# Patient Record
Sex: Female | Born: 1940 | Race: White | Hispanic: No | Marital: Married | Smoking: Former smoker
Health system: Southern US, Community
[De-identification: ages and names within clinical notes are randomized; demographics above are authoritative.]

## PROBLEM LIST (undated history)

## (undated) DIAGNOSIS — I4891 Unspecified atrial fibrillation: Secondary | ICD-10-CM

## (undated) DIAGNOSIS — G8929 Other chronic pain: Secondary | ICD-10-CM

## (undated) DIAGNOSIS — I10 Essential (primary) hypertension: Secondary | ICD-10-CM

## (undated) DIAGNOSIS — R7301 Impaired fasting glucose: Secondary | ICD-10-CM

## (undated) DIAGNOSIS — H9190 Unspecified hearing loss, unspecified ear: Secondary | ICD-10-CM

## (undated) DIAGNOSIS — E039 Hypothyroidism, unspecified: Secondary | ICD-10-CM

## (undated) DIAGNOSIS — E785 Hyperlipidemia, unspecified: Secondary | ICD-10-CM

## (undated) DIAGNOSIS — I2510 Atherosclerotic heart disease of native coronary artery without angina pectoris: Secondary | ICD-10-CM

## (undated) DIAGNOSIS — M549 Dorsalgia, unspecified: Secondary | ICD-10-CM

## (undated) DIAGNOSIS — G4730 Sleep apnea, unspecified: Secondary | ICD-10-CM

## (undated) DIAGNOSIS — Z9289 Personal history of other medical treatment: Secondary | ICD-10-CM

## (undated) DIAGNOSIS — S8290XA Unspecified fracture of unspecified lower leg, initial encounter for closed fracture: Secondary | ICD-10-CM

## (undated) HISTORY — DX: Hyperlipidemia, unspecified: E78.5

## (undated) HISTORY — DX: Unspecified hearing loss, unspecified ear: H91.90

## (undated) HISTORY — DX: Dorsalgia, unspecified: M54.9

## (undated) HISTORY — DX: Impaired fasting glucose: R73.01

## (undated) HISTORY — PX: SPINE SURGERY: SHX786

## (undated) HISTORY — DX: Unspecified fracture of unspecified lower leg, initial encounter for closed fracture: S82.90XA

## (undated) HISTORY — DX: Sleep apnea, unspecified: G47.30

## (undated) HISTORY — DX: Personal history of other medical treatment: Z92.89

## (undated) HISTORY — DX: Hypothyroidism, unspecified: E03.9

## (undated) HISTORY — DX: Essential (primary) hypertension: I10

## (undated) HISTORY — DX: Other chronic pain: G89.29

## (undated) HISTORY — DX: Atherosclerotic heart disease of native coronary artery without angina pectoris: I25.10

## (undated) HISTORY — DX: Unspecified atrial fibrillation: I48.91

---
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
## 1973-12-19 HISTORY — PX: ABDOMINAL HYSTERECTOMY: SHX81

## 2003-02-03 ENCOUNTER — Encounter: Payer: Self-pay | Admitting: Orthopedic Surgery

## 2003-02-03 ENCOUNTER — Ambulatory Visit (HOSPITAL_COMMUNITY): Admission: RE | Admit: 2003-02-03 | Discharge: 2003-02-03 | Payer: Self-pay | Admitting: Orthopedic Surgery

## 2003-02-06 ENCOUNTER — Ambulatory Visit (HOSPITAL_COMMUNITY): Admission: RE | Admit: 2003-02-06 | Discharge: 2003-02-06 | Payer: Self-pay | Admitting: Internal Medicine

## 2003-02-06 ENCOUNTER — Encounter: Payer: Self-pay | Admitting: Internal Medicine

## 2003-02-12 ENCOUNTER — Inpatient Hospital Stay (HOSPITAL_COMMUNITY): Admission: AD | Admit: 2003-02-12 | Discharge: 2003-02-13 | Payer: Self-pay | Admitting: Orthopedic Surgery

## 2003-02-12 ENCOUNTER — Encounter (INDEPENDENT_AMBULATORY_CARE_PROVIDER_SITE_OTHER): Payer: Self-pay | Admitting: *Deleted

## 2003-02-12 ENCOUNTER — Encounter: Payer: Self-pay | Admitting: Orthopedic Surgery

## 2006-01-04 ENCOUNTER — Other Ambulatory Visit: Admission: RE | Admit: 2006-01-04 | Discharge: 2006-01-04 | Payer: Self-pay | Admitting: Family Medicine

## 2006-03-21 HISTORY — PX: COLONOSCOPY: SHX174

## 2006-03-21 LAB — HM COLONOSCOPY

## 2006-08-16 ENCOUNTER — Ambulatory Visit: Payer: Self-pay | Admitting: Family Medicine

## 2006-10-24 ENCOUNTER — Ambulatory Visit: Payer: Self-pay | Admitting: Family Medicine

## 2006-12-19 HISTORY — PX: CORONARY ANGIOPLASTY WITH STENT PLACEMENT: SHX49

## 2007-02-22 ENCOUNTER — Ambulatory Visit: Payer: Self-pay | Admitting: Family Medicine

## 2007-08-15 ENCOUNTER — Ambulatory Visit: Payer: Self-pay | Admitting: Family Medicine

## 2007-09-11 DIAGNOSIS — Z9289 Personal history of other medical treatment: Secondary | ICD-10-CM

## 2007-09-11 HISTORY — DX: Personal history of other medical treatment: Z92.89

## 2007-09-13 ENCOUNTER — Ambulatory Visit (HOSPITAL_COMMUNITY): Admission: RE | Admit: 2007-09-13 | Discharge: 2007-09-14 | Payer: Self-pay | Admitting: *Deleted

## 2008-01-01 ENCOUNTER — Ambulatory Visit: Payer: Self-pay | Admitting: Family Medicine

## 2008-01-08 ENCOUNTER — Ambulatory Visit: Payer: Self-pay | Admitting: Family Medicine

## 2008-01-15 LAB — HM DEXA SCAN

## 2008-10-14 ENCOUNTER — Ambulatory Visit: Payer: Self-pay | Admitting: Family Medicine

## 2008-10-16 ENCOUNTER — Encounter: Admission: RE | Admit: 2008-10-16 | Discharge: 2008-10-16 | Payer: Self-pay | Admitting: Family Medicine

## 2008-10-29 ENCOUNTER — Ambulatory Visit: Payer: Self-pay | Admitting: Family Medicine

## 2008-11-12 ENCOUNTER — Ambulatory Visit: Payer: Self-pay | Admitting: Family Medicine

## 2009-04-22 ENCOUNTER — Ambulatory Visit: Payer: Self-pay | Admitting: Family Medicine

## 2009-05-20 ENCOUNTER — Ambulatory Visit: Payer: Self-pay | Admitting: Family Medicine

## 2009-06-11 ENCOUNTER — Ambulatory Visit: Payer: Self-pay | Admitting: Family Medicine

## 2009-12-19 HISTORY — PX: KIDNEY SURGERY: SHX687

## 2010-01-21 ENCOUNTER — Ambulatory Visit: Payer: Self-pay | Admitting: Family Medicine

## 2010-01-21 ENCOUNTER — Other Ambulatory Visit: Admission: RE | Admit: 2010-01-21 | Discharge: 2010-01-21 | Payer: Self-pay | Admitting: Family Medicine

## 2010-01-21 LAB — HM PAP SMEAR: HM Pap smear: NORMAL

## 2010-02-03 ENCOUNTER — Ambulatory Visit: Payer: Self-pay | Admitting: Family Medicine

## 2010-03-04 ENCOUNTER — Encounter: Admission: RE | Admit: 2010-03-04 | Discharge: 2010-03-04 | Payer: Self-pay | Admitting: Family Medicine

## 2010-03-04 LAB — HM MAMMOGRAPHY: HM Mammogram: NEGATIVE

## 2010-04-26 ENCOUNTER — Encounter: Admission: RE | Admit: 2010-04-26 | Discharge: 2010-04-26 | Payer: Self-pay | Admitting: Nephrology

## 2010-05-20 ENCOUNTER — Ambulatory Visit (HOSPITAL_COMMUNITY): Admission: RE | Admit: 2010-05-20 | Discharge: 2010-05-20 | Payer: Self-pay | Admitting: Urology

## 2010-05-26 ENCOUNTER — Encounter: Admission: RE | Admit: 2010-05-26 | Discharge: 2010-05-26 | Payer: Self-pay | Admitting: Urology

## 2010-07-30 ENCOUNTER — Ambulatory Visit (HOSPITAL_COMMUNITY): Admission: RE | Admit: 2010-07-30 | Discharge: 2010-07-31 | Payer: Self-pay | Admitting: Interventional Radiology

## 2010-07-30 ENCOUNTER — Encounter (INDEPENDENT_AMBULATORY_CARE_PROVIDER_SITE_OTHER): Payer: Self-pay | Admitting: Interventional Radiology

## 2010-09-08 ENCOUNTER — Encounter: Admission: RE | Admit: 2010-09-08 | Discharge: 2010-09-08 | Payer: Self-pay | Admitting: Interventional Radiology

## 2010-09-08 ENCOUNTER — Ambulatory Visit (HOSPITAL_COMMUNITY): Admission: RE | Admit: 2010-09-08 | Discharge: 2010-09-08 | Payer: Self-pay | Admitting: Interventional Radiology

## 2010-09-16 ENCOUNTER — Ambulatory Visit: Payer: Self-pay | Admitting: Family Medicine

## 2010-09-22 ENCOUNTER — Ambulatory Visit: Payer: Self-pay | Admitting: Family Medicine

## 2010-09-30 ENCOUNTER — Ambulatory Visit: Payer: Self-pay | Admitting: Family Medicine

## 2010-10-27 ENCOUNTER — Ambulatory Visit: Payer: Self-pay | Admitting: Family Medicine

## 2010-11-05 ENCOUNTER — Ambulatory Visit: Payer: Self-pay | Admitting: Family Medicine

## 2010-11-17 DIAGNOSIS — Z9289 Personal history of other medical treatment: Secondary | ICD-10-CM

## 2010-11-17 HISTORY — DX: Personal history of other medical treatment: Z92.89

## 2010-12-01 ENCOUNTER — Ambulatory Visit (HOSPITAL_COMMUNITY)
Admission: RE | Admit: 2010-12-01 | Discharge: 2010-12-01 | Payer: Self-pay | Source: Home / Self Care | Attending: Urology | Admitting: Urology

## 2011-01-09 ENCOUNTER — Encounter: Payer: Self-pay | Admitting: Interventional Radiology

## 2011-03-04 LAB — BASIC METABOLIC PANEL
BUN: 20 mg/dL (ref 6–23)
CO2: 27 mEq/L (ref 19–32)
CO2: 28 mEq/L (ref 19–32)
Calcium: 8.6 mg/dL (ref 8.4–10.5)
Chloride: 102 mEq/L (ref 96–112)
GFR calc Af Amer: >60 mL/min (ref >60)
GFR calc non Af Amer: >60 mL/min (ref >60)
Glucose, Bld: 115 mg/dL — ABNORMAL HIGH (ref 70–99)
Glucose, Bld: 95 mg/dL (ref 70–99)

## 2011-03-04 LAB — TYPE AND SCREEN
ABO/RH(D): A POS
Antibody Screen: POSITIVE
Donor AG Type: NEGATIVE
Donor AG Type: NEGATIVE
PT AG Type: NEGATIVE

## 2011-03-04 LAB — CBC
HCT: 32.9 % — ABNORMAL LOW (ref 36.0–46.0)
HCT: 35.4 % — ABNORMAL LOW (ref 36.0–46.0)
MCH: 34.2 pg — ABNORMAL HIGH (ref 26.0–34.0)
MCV: 97.5 fL (ref 78.0–100.0)
RBC: 3.37 MIL/uL — ABNORMAL LOW (ref 3.87–5.11)
RBC: 3.66 MIL/uL — ABNORMAL LOW (ref 3.87–5.11)
RDW: 13.1 % (ref 11.5–15.5)
WBC: 5.6 10*3/uL (ref 4.0–10.5)

## 2011-03-04 LAB — APTT: aPTT: 32 seconds (ref 24–37)

## 2011-03-04 LAB — SURGICAL PCR SCREEN
MRSA, PCR: NEGATIVE
Staphylococcus aureus: NEGATIVE

## 2011-03-14 ENCOUNTER — Other Ambulatory Visit: Payer: Self-pay | Admitting: Interventional Radiology

## 2011-03-14 DIAGNOSIS — C649 Malignant neoplasm of unspecified kidney, except renal pelvis: Secondary | ICD-10-CM

## 2011-03-15 ENCOUNTER — Other Ambulatory Visit (HOSPITAL_COMMUNITY): Payer: Self-pay | Admitting: Interventional Radiology

## 2011-03-15 DIAGNOSIS — C649 Malignant neoplasm of unspecified kidney, except renal pelvis: Secondary | ICD-10-CM

## 2011-03-23 ENCOUNTER — Ambulatory Visit
Admission: RE | Admit: 2011-03-23 | Discharge: 2011-03-23 | Disposition: A | Discharge status: Home / Self Care | Payer: Medicare Other | Source: Ambulatory Visit | Attending: Interventional Radiology | Admitting: Interventional Radiology

## 2011-03-23 ENCOUNTER — Ambulatory Visit (HOSPITAL_COMMUNITY)
Admission: RE | Admit: 2011-03-23 | Discharge: 2011-03-23 | Disposition: A | Discharge status: Home / Self Care | Payer: Medicare Other | Source: Ambulatory Visit | Attending: Interventional Radiology | Admitting: Interventional Radiology

## 2011-03-23 VITALS — BP 147/77 | HR 66 | Temp 98.1°F | Resp 16 | Ht 63.0 in | Wt 165.0 lb

## 2011-03-23 DIAGNOSIS — C649 Malignant neoplasm of unspecified kidney, except renal pelvis: Secondary | ICD-10-CM

## 2011-03-23 DIAGNOSIS — K7689 Other specified diseases of liver: Secondary | ICD-10-CM | POA: Insufficient documentation

## 2011-03-23 DIAGNOSIS — Z85528 Personal history of other malignant neoplasm of kidney: Secondary | ICD-10-CM | POA: Insufficient documentation

## 2011-03-23 DIAGNOSIS — K869 Disease of pancreas, unspecified: Secondary | ICD-10-CM | POA: Insufficient documentation

## 2011-03-23 DIAGNOSIS — N289 Disorder of kidney and ureter, unspecified: Secondary | ICD-10-CM | POA: Insufficient documentation

## 2011-03-23 MED ORDER — IOHEXOL 300 MG/ML  SOLN
100.0000 mL | Freq: Once | INTRAMUSCULAR | Status: AC | PRN
  Administered 2011-03-23: 100 mL via INTRAVENOUS

## 2011-03-24 NOTE — Progress Notes (Signed)
Pt states that she has experience 3 episodes of hematuria post RFA Right which respond immediately to antibiotic Rx.  Two episodes occurred after exercise.  Currently taking antibiotic  Prescribed by Dr Susann Givens for UTI Sx.  Otherwise has been doing well.  Working full time as Building services engineer.

## 2011-05-03 NOTE — Discharge Summary (Signed)
[name], Theresa                ACCOUNT NO.:  [id number]   MEDICAL RECORD NO.:  [id number]          PATIENT TYPE:  OIB   LOCATION:  6522                         FACILITY:  [facility]   PHYSICIAN:  Ritta Slot, MD     DATE OF BIRTH:  Oct 04, 1941   DATE OF ADMISSION:  09/13/2007  DATE OF DISCHARGE:  09/14/2007                               DISCHARGE SUMMARY   DISCHARGE DIAGNOSES:  1. Chest pain, outpatient catheterization revealing left anterior      descending  disease status post elective left anterior descending      Endeavor stent placement this admission.  2. Preserved left ventricular function.  3. Treated hypertension.   HOSPITAL COURSE:  The patient is a pleasant 70 year old female with  hypertension and treated dyslipidemia who presented to Dr. Lynnea Ferrier  August 22, 2007 with complaints of chest pain.  Outpatient evaluation  including a CT scan of her chest, echocardiogram and Myoview were  performed.  Myoview was abnormal.  [facility], 2007.  This revealed an 80% LAD after the takeoff of the first diagonal with  normal circumflex, normal RCA and normal LV function.  The patient was  admitted September 13, 2007 for elective LAD stenting which was  performed by Dr. Jenne Campus using an Endeavor stent.  She tolerated the  procedure well, and we feel she can discharge September 14, 2007.   LABORATORY DATA:  TSH is 3.975.  CK-MB and troponins were negative.  Sodium 137, potassium 3.5, BUN 14, creatinine 0.86, glucose 87.  White  count 4.1, hemoglobin 10.8, hematocrit 31.1, platelets 192.  EKG shows  sinus rhythm and sinus bradycardia without acute changes.   DISCHARGE MEDICATIONS:  1. Lipitor 20 mg a day.  2. Lotrel 5/20 daily.  3. Triamterene hydrochlorothiazide 37.5/12.5 daily.  4. Synthroid 0.05 mg a day.  5. Naprosyn 500 mg b.i.d. p.r.n.  6. B-complex vitamins.  7. Calcium.  8.  Magnesium.  9. Zinc.  10.Vitamin D as taken at home.  11.Flax seed oil once a day.  12.Plavix 75 mg a day.  13.Two baby aspirin daily.  14.Nitroglycerin sublingual p.r.n.   DISPOSITION:  The patient is discharged in stable condition and will  follow up with Dr. Lynnea Ferrier as an outpatient.      Abelino Derrick, P.A.      Ritta Slot, MD  Electronically Signed    LKK/MEDQ  D:  09/14/2007  T:  09/14/2007  Job:  [id number]   cc:   Ritta Slot, MD  Sharlot Gowda, M.D.

## 2011-05-03 NOTE — Cardiovascular Report (Signed)
Theresa Mccarty, Theresa Mccarty                ACCOUNT NO.:  [id number]   MEDICAL RECORD NO.:  [id number]          PATIENT TYPE:  OIB   LOCATION:  2859                         FACILITY:  [facility]   PHYSICIAN:  Darlin Priestly, MD  DATE OF BIRTH:  04/23/1941   DATE OF PROCEDURE:  09/13/2007  DATE OF DISCHARGE:                            CARDIAC CATHETERIZATION   PROCEDURE:  1. Coronary angiography.  2. LAD-mid:      a.     Placement of intracoronary stent.   ATTENDING:  Darlin Priestly, MD   COMPLICATIONS:  None.   INDICATIONS:  [name] is a 70 year old female patient of Dr. Sharlot Gowda, Dr. Ritta Slot, with a history of hypertension,  hyperlipidemia, history of atypical chest pain on and off for the last  month.  It appears she did have Cardiolite scan suggesting anterior  ischemia with subsequent cardiac catheterization by Dr. Lynnea Ferrier,  demonstrating an 80 to 90% mid-LAD stenotic lesion.  She is now brought  for percutaneous intervention of the LAD.   DESCRIPTION OF PROCEDURE:  After giving informed consent, the patient is  brought to the cardiac cath lab where the right groin is shaved, prepped  and draped in a sterile fashion. ECG monitor established.  Using a  modified Seldinger technique, #6 French arterial sheath inserted in the  right femoral artery.  Next a #6 [place] guiding catheter was  coaxially engaged in the left coronary ostium.  Following this, a 0.014  Cougar XT guide wire was advanced down the guiding catheter and  positioned in the distal LAD without difficulty.  Next a Medtronic  Endeavor 2.5 x 14 mm stent was then positioned across the mid-LAD  stenotic lesion.  The stent was then deployed to a maximum of 9  atmospheres for a total of 14 seconds.  Her second inflation at 10  atmospheres performed for a total of 13 seconds.  Followup angiogram  revealed no evidence of dissection or thrombus with TIMI flow to the  distal vessel.  The stent balloon is then  removed and a 2.5 x 10 mm Dura  Star balloon was then positioned in the distal portion of the stent.  Multiple overlapping inflations throughout the distal mid-and-proximal  portion of the stent up to 20 atmospheres performed for a total of  approximately 46 seconds.  Follow-up angiogram revealed no evidence of  dissection or thrombus with TIMI flow to distal vessel.  IV Integrilin  was used throughout the case.  Intravenous dose of heparin given to  maintain the ST between 200 and 300.   Final orthogonal angiograms revealed less than 20 cm of  stenosis in the  mid-LAD stenotic lesion with TIMI flow to the distal vessel.  At this  point we elected to conclude the procedure.  All balloons, wires and  catheters were removed.  Hemostatic sheaths were sewn in place.  The  patient transferred back to the ward in stable condition.   CONCLUSION:  1. Successful placement of a Medtronic Endeavor 2.5 x 14 mm stent in      the mid-LAD stenotic  lesion, ultimately post dilated to 2.58 mm.  2. Adjuvant use of Integrilin infusion.      Darlin Priestly, MD  Electronically Signed     RHM/MEDQ  D:  09/13/2007  T:  09/13/2007  Job:  [id number]   cc:   Sharlot Gowda, M.D.  Ritta Slot, MD

## 2011-05-08 ENCOUNTER — Other Ambulatory Visit: Payer: Self-pay | Admitting: Family Medicine

## 2011-05-23 ENCOUNTER — Other Ambulatory Visit: Payer: Self-pay

## 2011-05-23 MED ORDER — ATORVASTATIN CALCIUM 20 MG PO TABS: 20.0000 mg | ORAL_TABLET | Freq: Every day | ORAL | Status: DC

## 2011-05-31 ENCOUNTER — Ambulatory Visit (HOSPITAL_COMMUNITY)
Admission: RE | Admit: 2011-05-31 | Discharge: 2011-05-31 | Disposition: A | Discharge status: Home / Self Care | Payer: Medicare Other | Source: Ambulatory Visit | Attending: Urology | Admitting: Urology

## 2011-05-31 ENCOUNTER — Other Ambulatory Visit (HOSPITAL_COMMUNITY): Payer: Self-pay | Admitting: Urology

## 2011-05-31 DIAGNOSIS — R062 Wheezing: Secondary | ICD-10-CM | POA: Insufficient documentation

## 2011-05-31 DIAGNOSIS — C649 Malignant neoplasm of unspecified kidney, except renal pelvis: Secondary | ICD-10-CM | POA: Insufficient documentation

## 2011-06-02 ENCOUNTER — Telehealth: Payer: Self-pay | Admitting: Family Medicine

## 2011-06-02 NOTE — Telephone Encounter (Signed)
Called med per jcl 

## 2011-06-02 NOTE — Telephone Encounter (Signed)
Med called in levothyroxine 50 mcg #30 w/0 rf pt called and informed

## 2011-06-02 NOTE — Telephone Encounter (Signed)
Go ahead and call in the thyroid medicine for her

## 2011-06-21 ENCOUNTER — Encounter: Payer: Self-pay | Admitting: Family Medicine

## 2011-07-08 ENCOUNTER — Telehealth: Payer: Self-pay | Admitting: Medical

## 2011-07-08 ENCOUNTER — Other Ambulatory Visit: Payer: Self-pay | Admitting: Medical

## 2011-07-08 MED ORDER — LEVOTHYROXINE SODIUM 50 MCG PO TABS: 50.0000 ug | ORAL_TABLET | Freq: Every day | ORAL | Status: DC

## 2011-07-08 NOTE — Telephone Encounter (Signed)
Rx for thyroid med sent.

## 2011-07-08 NOTE — Telephone Encounter (Signed)
Theresa Mccarty took care of

## 2011-07-11 ENCOUNTER — Encounter: Payer: Self-pay | Admitting: Medical

## 2011-07-11 ENCOUNTER — Ambulatory Visit (INDEPENDENT_AMBULATORY_CARE_PROVIDER_SITE_OTHER): Payer: Medicare Other | Admitting: Medical

## 2011-07-11 VITALS — BP 138/89 | HR 63 | Temp 97.9°F | Ht 63.0 in | Wt 170.5 lb

## 2011-07-11 DIAGNOSIS — T887XXA Unspecified adverse effect of drug or medicament, initial encounter: Secondary | ICD-10-CM

## 2011-07-11 DIAGNOSIS — Z79899 Other long term (current) drug therapy: Secondary | ICD-10-CM

## 2011-07-11 DIAGNOSIS — E785 Hyperlipidemia, unspecified: Secondary | ICD-10-CM

## 2011-07-11 DIAGNOSIS — I2510 Atherosclerotic heart disease of native coronary artery without angina pectoris: Secondary | ICD-10-CM

## 2011-07-11 DIAGNOSIS — E039 Hypothyroidism, unspecified: Secondary | ICD-10-CM

## 2011-07-11 DIAGNOSIS — T50905A Adverse effect of unspecified drugs, medicaments and biological substances, initial encounter: Secondary | ICD-10-CM

## 2011-07-11 LAB — COMPREHENSIVE METABOLIC PANEL
ALT: 18 U/L (ref 0–35)
AST: 20 U/L (ref 0–37)
Alkaline Phosphatase: 73 U/L (ref 39–117)
Creat: 0.76 mg/dL (ref 0.50–1.10)
Total Bilirubin: 0.6 mg/dL (ref 0.3–1.2)

## 2011-07-11 LAB — CBC WITH DIFFERENTIAL/PLATELET
Basophils Absolute: 0 10*3/uL (ref 0.0–0.1)
Eosinophils Relative: 3 % (ref 0–5)
HCT: 35.9 % — ABNORMAL LOW (ref 36.0–46.0)
Lymphocytes Relative: 27 % (ref 12–46)
MCHC: 34.5 g/dL (ref 30.0–36.0)
MCV: 95.5 fL (ref 78.0–100.0)
Monocytes Absolute: 0.3 10*3/uL (ref 0.1–1.0)
RDW: 13.2 % (ref 11.5–15.5)
WBC: 3.7 10*3/uL — ABNORMAL LOW (ref 4.0–10.5)

## 2011-07-11 LAB — LIPID PANEL
Cholesterol: 150 mg/dL (ref 0–200)
LDL Cholesterol: 68 mg/dL (ref 0–99)
VLDL: 14 mg/dL (ref 0–40)

## 2011-07-11 LAB — T4, FREE: Free T4: 1.12 ng/dL (ref 0.80–1.80)

## 2011-07-11 LAB — TSH: TSH: 1.328 u[IU]/mL (ref 0.350–4.500)

## 2011-07-11 MED ORDER — LEVOTHYROXINE SODIUM 50 MCG PO TABS: 50.0000 ug | ORAL_TABLET | Freq: Every day | ORAL | Status: DC

## 2011-07-11 NOTE — Progress Notes (Signed)
Subjective:   HPI  Theresa Mccarty is a 70 y.o. female who presents for general recheck.  Her last visit here was in November 2011. She needs refills on her thyroid medication, and is due for fasting labs. She has a follow up appointment with [facility] with Dr. Rennis Golden later this week. She was seen Dr. Lynnea Ferrier for cardiology but he left the practice. She notes that cardiology had changed her blood pressure medicine and put her on Diovan. However she does not like the way Diovan makes her feel. She notes that it upsets her stomach, and for the first week or so she was on the medication her stool was loose and dark. This has resolved. She does note a colonoscopy within the last 10 years. She would like to get off her cholesterol medicine.  She notes her last few times her cholesterol was checked it was very good.  She is exercising with a stationary bike for about 15 minutes a day. She tries to eat pretty healthy. No other new complaints at this time. She has had regular followup with nephrology, Dr. Laverle Patter, for history of cancer with subsequent surgery around her right kidney.  The following portions of the patient's history were reviewed and updated as appropriate: allergies, current medications, past family history, past medical history, past social history, past surgical history and problem list.  Past Medical History  Diagnosis Date  . Hypertension   . Osteoporosis   . Back pain, chronic   . Hypothyroid   . CAD (coronary artery disease)   . Impaired fasting glucose   . Sleep apnea   . Broken leg 1958  . Renal cell carcinoma     Dr. Laverle Patter, [facility]  . Hyperlipidemia      Review of Systems Constitutional: denies fever, chills, sweats, unexpected weight change, anorexia, fatigue Cardiology: denies chest pain, palpitations, edema Respiratory: denies cough, shortness of breath, wheezing Gastroenterology: denies abdominal pain, nausea, vomiting, diarrhea,  constipation Musculoskeletal: denies arthralgias, myalgias, joint swelling, back pain Urology: denies dysuria, difficulty urinating, hematuria, urinary frequency, urgency Neurology: no headache, weakness, tingling, numbness     Objective:   Physical Exam  General appearance: alert, no distress, WD/WN, white female Skin: warm, dry Neck: supple, no lymphadenopathy, no thyromegaly, no masses, no bruits Heart: RRR, normal S1, S2, no murmurs Lungs: CTA bilaterally, no wheezes, rhonchi, or rales Abdomen: +bs, soft, non tender, non distended, no masses, no hepatomegaly, no splenomegaly, no bruits Extremities: no edema, no cyanosis, no clubbing Pulses: 2+ symmetric, upper and lower extremities, normal cap refill   Assessment :    Encounter Diagnoses  Name Primary?  . Hypothyroidism Yes  . Hyperlipemia   . Encounter for long-term (current) use of other medications   . Coronary artery disease   . Adverse drug effect     Plan:    Hypothyroidism-labs today, and assuming normal labs, refill prescription given today  Hyperlipidemia-fasting labs today, and we will send a copy to her cardiologist.  If LDL still well within goal, we can consider cutting her statin dose in half, but I recommended she stay on this medication given her coronary artery disease  Coronary artery disease-followup with cardiology later this week  Adverse drug effect-she notes problems with GI upset on Diovan, and she will discuss this with them this week  S/p surgery for right renal carcinoma - followed by Dr. [facility]  Overall I recommended she increase the amount of time she is exercising, continue to  try and eat healthy.

## 2011-07-12 ENCOUNTER — Telehealth: Payer: Self-pay | Admitting: *Deleted

## 2011-07-12 NOTE — Telephone Encounter (Addendum)
Message copied by Dorthula Perfect on Tue Jul 12, 2011  8:25 AM ------      Message from: Aleen Campi, Kermit Balo      Created: Tue Jul 12, 2011  7:45 AM       Sodium is a little low and it has been this way before, likely due to her being on fluid pill in her combo BP medication.  Cholesterol looks really good. Thyroid labs normal, electrolytes, liver, kidney, otherwise normal.  There are some minor changes in her blood counts, but nothing to worry about.  As we discussed, she can cut her cholesterol tablet in half.  So just take 1/2 tablet for now.  All other meds the same.  Lets recheck OV and chol labs in 12mo.  Please send copy of note and labs to both [facility]/Dr. Hilty and to Dr. Laverle Patter at [facility].   Pt notified of lab results.  Pt agreed to take 1/2 tablet of cholesterol medication.  Faxed office visit and labs to Dr. Rennis Golden at [facility] and Dr. Laverle Patter at [facility].  Pt will call back to schedule 3 month follow up.  CM, LPN

## 2011-07-15 ENCOUNTER — Ambulatory Visit (INDEPENDENT_AMBULATORY_CARE_PROVIDER_SITE_OTHER): Payer: Medicare Other | Admitting: Family Medicine

## 2011-07-15 ENCOUNTER — Encounter: Payer: Self-pay | Admitting: Family Medicine

## 2011-07-15 ENCOUNTER — Telehealth: Payer: Self-pay | Admitting: Family Medicine

## 2011-07-15 VITALS — BP 140/92 | HR 60 | Wt 174.0 lb

## 2011-07-15 DIAGNOSIS — K922 Gastrointestinal hemorrhage, unspecified: Secondary | ICD-10-CM

## 2011-07-15 NOTE — Telephone Encounter (Signed)
Pt called lots of blood in stool.  She said you discussed getting colonoscopy.  Do you want to see her today or go ahead and refer her to GI?  Please advise pt.

## 2011-07-15 NOTE — Progress Notes (Signed)
  Subjective:    Patient ID: Theresa Mccarty, female    DOB: 09-22-1941, 70 y.o.   MRN: [id number]  HPI This morning she noted an episode of bright red blood in the stool. She's had no abdominal pain, nausea, vomiting. She has had a colonoscopy within the last 10 years but is not sure exactly when. She has noted no tenderness to palpation or lesions in the rectal area.   Review of Systems     Objective:   Physical Exam Alert and in no distress. Anus is normal. Rectal exam shows no internal masses, stool was dark brown and guaiac-negative ;recent blood work was negative.       Assessment & Plan:  GI bleed, etiology unclear Refer to GI for further evaluation.

## 2011-07-21 ENCOUNTER — Other Ambulatory Visit: Payer: Self-pay | Admitting: Interventional Radiology

## 2011-07-22 NOTE — Telephone Encounter (Signed)
done

## 2011-07-26 HISTORY — PX: COLONOSCOPY: SHX174

## 2011-07-26 LAB — HM COLONOSCOPY

## 2011-08-29 ENCOUNTER — Other Ambulatory Visit: Payer: Self-pay | Admitting: Interventional Radiology

## 2011-08-30 LAB — CREATININE WITH EST GFR
Creat: 0.86 mg/dL (ref 0.50–1.10)
GFR, Est Non African American: >60 mL/min (ref >60)

## 2011-08-30 LAB — BUN: BUN: 23 mg/dL (ref 6–23)

## 2011-09-06 ENCOUNTER — Ambulatory Visit
Admission: RE | Admit: 2011-09-06 | Discharge: 2011-09-06 | Disposition: A | Discharge status: Home / Self Care | Payer: Medicare Other | Source: Ambulatory Visit | Attending: Interventional Radiology | Admitting: Interventional Radiology

## 2011-09-06 ENCOUNTER — Ambulatory Visit (HOSPITAL_COMMUNITY)
Admission: RE | Admit: 2011-09-06 | Discharge: 2011-09-06 | Disposition: A | Discharge status: Home / Self Care | Payer: Medicare Other | Source: Ambulatory Visit | Attending: Interventional Radiology | Admitting: Interventional Radiology

## 2011-09-06 DIAGNOSIS — Q619 Cystic kidney disease, unspecified: Secondary | ICD-10-CM | POA: Insufficient documentation

## 2011-09-06 DIAGNOSIS — K7689 Other specified diseases of liver: Secondary | ICD-10-CM | POA: Insufficient documentation

## 2011-09-06 DIAGNOSIS — C649 Malignant neoplasm of unspecified kidney, except renal pelvis: Secondary | ICD-10-CM | POA: Insufficient documentation

## 2011-09-06 MED ORDER — IOHEXOL 300 MG/ML  SOLN: 100.0000 mL | Freq: Once | INTRAMUSCULAR | Status: DC | PRN

## 2011-09-06 NOTE — Progress Notes (Signed)
Denies hematuria.   Denies problems w/ urination.    Denies flank pain or abdominal discomfort.  Continues to work full time at [place].  Endurance good.  Next f/u app't w/ Dr. Laverle Patter scheduled for 11/30/2011

## 2011-09-08 ENCOUNTER — Other Ambulatory Visit (HOSPITAL_COMMUNITY): Payer: Self-pay | Admitting: Interventional Radiology

## 2011-09-08 ENCOUNTER — Other Ambulatory Visit: Payer: Self-pay | Admitting: Interventional Radiology

## 2011-09-29 LAB — CBC
HCT: 31.1 — ABNORMAL LOW
HCT: 32.2 — ABNORMAL LOW
HCT: 33.7 — ABNORMAL LOW
Hemoglobin: 10.8 — ABNORMAL LOW
Hemoglobin: 11.1 — ABNORMAL LOW
Hemoglobin: 11.7 — ABNORMAL LOW
MCHC: 34.5
MCHC: 34.7
MCHC: 34.7
MCV: 93.7
MCV: 94.9
MCV: 95.4
Platelets: 192
Platelets: 224
Platelets: 231
RBC: 3.28 — ABNORMAL LOW
RBC: 3.38 — ABNORMAL LOW
RBC: 3.6 — ABNORMAL LOW
RDW: 12.8
RDW: 12.8
RDW: 12.9
WBC: 3.8 — ABNORMAL LOW
WBC: 4.1
WBC: 5.4

## 2011-09-29 LAB — BASIC METABOLIC PANEL
BUN: 13
BUN: 14
CO2: 27
CO2: 28
Calcium: 8.7
Calcium: 9.2
Chloride: 104
Chloride: 106
Creatinine, Ser: 0.86
Creatinine, Ser: 0.91
GFR calc Af Amer: >60
GFR calc Af Amer: >60
GFR calc non Af Amer: >60
GFR calc non Af Amer: >60
Glucose, Bld: 87
Glucose, Bld: 98
Potassium: 3.5
Potassium: 4.2
Sodium: 137
Sodium: 139

## 2011-09-29 LAB — TROPONIN I
Troponin I: 0.03
Troponin I: 0.04

## 2011-09-29 LAB — CK TOTAL AND CKMB (NOT AT ARMC)
CK, MB: 0.8
CK, MB: 0.9
Relative Index: INVALID
Relative Index: INVALID
Total CK: 16
Total CK: 60

## 2011-09-29 LAB — TSH: TSH: 3.975

## 2011-10-08 ENCOUNTER — Emergency Department (HOSPITAL_COMMUNITY): Payer: Medicare Other

## 2011-10-08 ENCOUNTER — Inpatient Hospital Stay (HOSPITAL_COMMUNITY)
Admission: EM | Admit: 2011-10-08 | Discharge: 2011-10-10 | Disposition: A | Discharge status: Home / Self Care | Payer: Medicare Other | Source: Home / Self Care | Attending: Internal Medicine | Admitting: Internal Medicine

## 2011-10-08 DIAGNOSIS — I2510 Atherosclerotic heart disease of native coronary artery without angina pectoris: Secondary | ICD-10-CM | POA: Diagnosis present

## 2011-10-08 DIAGNOSIS — R0789 Other chest pain: Principal | ICD-10-CM | POA: Diagnosis present

## 2011-10-08 DIAGNOSIS — E669 Obesity, unspecified: Secondary | ICD-10-CM | POA: Diagnosis present

## 2011-10-08 DIAGNOSIS — Z79899 Other long term (current) drug therapy: Secondary | ICD-10-CM

## 2011-10-08 DIAGNOSIS — Z9861 Coronary angioplasty status: Secondary | ICD-10-CM

## 2011-10-08 DIAGNOSIS — E871 Hypo-osmolality and hyponatremia: Secondary | ICD-10-CM | POA: Diagnosis present

## 2011-10-08 DIAGNOSIS — I10 Essential (primary) hypertension: Secondary | ICD-10-CM | POA: Diagnosis present

## 2011-10-08 DIAGNOSIS — G4733 Obstructive sleep apnea (adult) (pediatric): Secondary | ICD-10-CM | POA: Diagnosis present

## 2011-10-08 DIAGNOSIS — Z7982 Long term (current) use of aspirin: Secondary | ICD-10-CM

## 2011-10-08 DIAGNOSIS — Z7902 Long term (current) use of antithrombotics/antiplatelets: Secondary | ICD-10-CM

## 2011-10-08 DIAGNOSIS — E785 Hyperlipidemia, unspecified: Secondary | ICD-10-CM | POA: Diagnosis present

## 2011-10-08 HISTORY — PX: CARDIAC CATHETERIZATION: SHX172

## 2011-10-08 LAB — CBC
HCT: 34.1 % — ABNORMAL LOW (ref 36.0–46.0)
Hemoglobin: 12.3 g/dL (ref 12.0–15.0)
MCH: 33.4 pg (ref 26.0–34.0)
MCHC: 36.1 g/dL — ABNORMAL HIGH (ref 30.0–36.0)
MCV: 92.7 fL (ref 78.0–100.0)

## 2011-10-08 LAB — COMPREHENSIVE METABOLIC PANEL
Alkaline Phosphatase: 72 U/L (ref 39–117)
BUN: 14 mg/dL (ref 6–23)
Calcium: 9.4 mg/dL (ref 8.4–10.5)
GFR calc Af Amer: >90 mL/min (ref >90)
Glucose, Bld: 111 mg/dL — ABNORMAL HIGH (ref 70–99)
Potassium: 3.7 mEq/L (ref 3.5–5.1)
Total Protein: 6.6 g/dL (ref 6.0–8.3)

## 2011-10-08 LAB — PROTIME-INR: INR: 1.02 (ref 0.00–1.49)

## 2011-10-08 LAB — DIFFERENTIAL
Lymphocytes Relative: 15 % (ref 12–46)
Lymphs Abs: 0.7 10*3/uL (ref 0.7–4.0)
Neutro Abs: 3.1 10*3/uL (ref 1.7–7.7)
Neutrophils Relative %: 71 % (ref 43–77)

## 2011-10-08 LAB — PRO B NATRIURETIC PEPTIDE: Pro B Natriuretic peptide (BNP): 203.4 pg/mL — ABNORMAL HIGH (ref 0–125)

## 2011-10-08 LAB — POCT I-STAT TROPONIN I
Troponin i, poc: 0 ng/mL (ref 0.00–0.08)
Troponin i, poc: 0 ng/mL (ref 0.00–0.08)

## 2011-10-08 LAB — CK TOTAL AND CKMB (NOT AT ARMC): CK, MB: 2.4 ng/mL (ref 0.3–4.0)

## 2011-10-08 MED ORDER — IOHEXOL 300 MG/ML  SOLN
100.0000 mL | Freq: Once | INTRAMUSCULAR | Status: AC | PRN
  Administered 2011-10-08: 100 mL via INTRAVENOUS

## 2011-10-09 LAB — HEPARIN LEVEL (UNFRACTIONATED): Heparin Unfractionated: 0.45 IU/mL (ref 0.30–0.70)

## 2011-10-09 LAB — CBC
HCT: 30.4 % — ABNORMAL LOW (ref 36.0–46.0)
Platelets: 165 10*3/uL (ref 150–400)
RDW: 12.7 % (ref 11.5–15.5)
WBC: 4.1 10*3/uL (ref 4.0–10.5)

## 2011-10-09 LAB — LIPID PANEL
Cholesterol: 131 mg/dL (ref 0–200)
LDL Cholesterol: 50 mg/dL (ref 0–99)
VLDL: 10 mg/dL (ref 0–40)

## 2011-10-09 LAB — BASIC METABOLIC PANEL
BUN: 11 mg/dL (ref 6–23)
GFR calc non Af Amer: 85 mL/min — ABNORMAL LOW (ref >90)
Glucose, Bld: 94 mg/dL (ref 70–99)
Potassium: 3.5 mEq/L (ref 3.5–5.1)

## 2011-10-09 LAB — URINALYSIS, ROUTINE W REFLEX MICROSCOPIC
Bilirubin Urine: NEGATIVE
Glucose, UA: NEGATIVE mg/dL
Hgb urine dipstick: NEGATIVE
Ketones, ur: NEGATIVE mg/dL
Protein, ur: NEGATIVE mg/dL

## 2011-10-09 LAB — CK TOTAL AND CKMB (NOT AT ARMC)
CK, MB: 2.5 ng/mL (ref 0.3–4.0)
CK, MB: 2.4 ng/mL (ref 0.3–4.0)
Relative Index: INVALID (ref 0.0–2.5)
Total CK: 40 U/L (ref 7–177)

## 2011-10-09 LAB — HEMOGLOBIN A1C
Hgb A1c MFr Bld: 5.3 % (ref <5.7)
Mean Plasma Glucose: 105 mg/dL (ref <117)

## 2011-10-09 LAB — OSMOLALITY, URINE: Osmolality, Ur: 418 mOsm/kg (ref 390–1090)

## 2011-10-10 ENCOUNTER — Inpatient Hospital Stay (HOSPITAL_COMMUNITY)
Admission: AD | Admit: 2011-10-10 | Discharge: 2011-10-11 | DRG: 287 | Disposition: A | Discharge status: Home / Self Care | Payer: Medicare Other | Source: Ambulatory Visit | Attending: Internal Medicine | Admitting: Internal Medicine

## 2011-10-10 LAB — BASIC METABOLIC PANEL
CO2: 27 mEq/L (ref 19–32)
GFR calc non Af Amer: 84 mL/min — ABNORMAL LOW (ref >90)
Glucose, Bld: 95 mg/dL (ref 70–99)
Potassium: 3.7 mEq/L (ref 3.5–5.1)
Sodium: 127 mEq/L — ABNORMAL LOW (ref 135–145)

## 2011-10-10 LAB — CBC
HCT: 29.8 % — ABNORMAL LOW (ref 36.0–46.0)
Hemoglobin: 10.4 g/dL — ABNORMAL LOW (ref 12.0–15.0)
RBC: 3.17 MIL/uL — ABNORMAL LOW (ref 3.87–5.11)
WBC: 3.9 10*3/uL — ABNORMAL LOW (ref 4.0–10.5)

## 2011-10-10 LAB — POCT ACTIVATED CLOTTING TIME: Activated Clotting Time: 111 seconds

## 2011-10-10 LAB — HEPARIN LEVEL (UNFRACTIONATED): Heparin Unfractionated: 0.4 IU/mL (ref 0.30–0.70)

## 2011-10-11 LAB — CBC
HCT: 32.9 % — ABNORMAL LOW (ref 36.0–46.0)
MCV: 94.3 fL (ref 78.0–100.0)
Platelets: 180 10*3/uL (ref 150–400)
RBC: 3.49 MIL/uL — ABNORMAL LOW (ref 3.87–5.11)
WBC: 4.3 10*3/uL (ref 4.0–10.5)

## 2011-10-11 LAB — BASIC METABOLIC PANEL
CO2: 26 mEq/L (ref 19–32)
Chloride: 97 mEq/L (ref 96–112)
Creatinine, Ser: 0.79 mg/dL (ref 0.50–1.10)
Potassium: 3.8 mEq/L (ref 3.5–5.1)

## 2011-10-11 NOTE — Cardiovascular Report (Addendum)
[name], Mccarty                ACCOUNT NO.:  [id number]  MEDICAL RECORD NO.:  [id number]  LOCATION:  1435                         FACILITY:  [facility]  PHYSICIAN:  Italy Hilty, MD         DATE OF BIRTH:  Apr 20, 1941  DATE OF PROCEDURE:  10/08/2011 DATE OF DISCHARGE:  10/10/2011                           CARDIAC CATHETERIZATION   OPERATOR:  Italy Hilty, MD  INDICATION:  Chest pain.  HISTORY OF PRESENT ILLNESS:  Theresa Mccarty is a 70 year old female with a history of coronary artery disease status post stent to the mid LAD in 2008.  She also has hypertension which has been difficult to control, dyslipidemia and presented with crescendo symptoms concerning for angina.  She is referred for left heart catheterization.  Cardiac enzymes that are negative.  PROCEDURE:  After informed consent was obtained, the patient was brought to cardiac catheterization lab and after procedural and radiation safety time-out, she was sterilely prepped and draped in the usual fashion. The right femoral artery was identified with some difficulty due to a weakly palpable pulse.  The femoral head was then fluoroscopically located, and the arteriotomy was made with a straight needle and wire. The vessel was difficult to access and ultimately arteriotomy site was slightly higher and successful access was made.  The cardiac catheterization was then performed with a 5-French JL-4, JR-4, and pigtail catheters.  Estimated blood loss was less than 10 mL.  At the end of the procedure, an angiogram was obtained of the femoral artery, which demonstrated a high arteriotomy stick above the inguinal ligament. It was just inferior to the inferior epigastric vein.  This finding was discussed with Dr. Herbie Baltimore, our interventional cardiologist, as well as Dr. Tonny Bollman and we decided to close the vessel with a Perclose device.  This was then closed without difficulty by Dr. Excell Seltzer.  Direct pressure was held after  that for 5 minutes without any evidence of bleeding or oozing from the closure site.  The patient was given a mg Versed and 50 mcg of fentanyl during the procedure.  A 10 mL of 1% lidocaine was used for anesthesia and 100 mcg of IC nitroglycerin was given during the case.  The patient also received 8 mcg of sublingual nitro spray during the case due to high LVEDP and hypertension. Finally, the patient was given 1 g of Ancef IV due to the arteriotomy closure.  FINDINGS: 1. LM - no disease. 2. LAD - proximal stent was patent.  There was up to 30% stenosis just     prior to the stent, however, this was stable and unchanged from her     previous study.  There was no notable change with 100 mcg IC nitroglycerin. 3. Left circumflex - no significant disease. 4. RCA - dominant.  No significant disease. 5. LVEF > 55%. 6. LVEDP = 26 mmHg.  IMPRESSION: 1. No significant obstructive coronary artery disease. 2. High left ventricular end-diastolic pressure. 3. High arteriotomy stick.  PLAN:  At this point, there is no clear stenosis to explain her anginal pain.  I have recommended increased blood pressure control.  There was a noted high arteriotomy stick as  mentioned, and we elected to close this with a Perclose device.  This was closed successfully with no evidence of bleeding.  Direct pressure was held, and I would like to keep her overnight for observation, additionally like 4 hours of bedrest.  In the morning, we will recheck hemoglobin and hematocrit to assure stability.    ______________________________ Italy Hilty, MD    CH/MEDQ  D:  10/10/2011  T:  10/11/2011  Job:  [id number]  Electronically Signed by K. HILTY M.D. on 10/19/2011 08:18:19 AM

## 2011-10-14 NOTE — Discharge Summary (Addendum)
Theresa Mccarty, Theresa Mccarty                ACCOUNT NO.:  [id number]  MEDICAL RECORD NO.:  [id number]  LOCATION:                               FACILITY:  [facility]  PHYSICIAN:  Theresa Trevar Boehringer, MD         DATE OF BIRTH:  [birth year]  DATE OF ADMISSION:  10/10/2011 DATE OF DISCHARGE:  10/11/2011                              DISCHARGE SUMMARY   DISCHARGE DIAGNOSES: 1. Unstable angina secondary to hypertension.     a.     Negative myocardial infarction.     b.     Stable coronary disease with patent stent to the left      anterior descending.     c.     LVEDP 26 mmHg. 2. Hypertension, now well controlled. 3. Obstructive sleep apnea with continuous positive airway pressure. 4. History of right clear cell renal carcinoma.  DISCHARGE CONDITION:  Improved.  PROCEDURES:  Combined left heart cath, October 10, 2011, by Dr. [place] Roshana Shuffield.  HOSPITAL COURSE:  A 70 year old female presented to [facility] Room with history of coronary artery disease with previous PCI to the mid LAD with a drug-eluting stent.  She was still on her Plavix and aspirin.  Hypertension has been difficult to control as an outpatient.  She does have obstructive sleep apnea on CPAP.  Prior to admission, she had 3-4 days of crescendo chest pain, which was burning in radiation to left arm.  She has also been very active over the last week with furniture market in town.  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  She was a wedding doing the floral arrangements on the day of admission when she had chest pain that would now resolve.  In the ER, pain improved with nitroglycerin. She had no EKG changes, and was admitted to rule out MI.  Please note on admission, the patient was restarted on her valsartan that she had been on it as an outpatient, but unfortunately could not afford it, so she stopped taking it. At discharge, we are combining her HCTZ and her valsartan together consists will  be generic so that will have better blood pressure control.  DISCHARGE MEDICATIONS:  As just stated, otherwise see medication reconciliation sheet from [facility].  BRIEF DISCHARGE INSTRUCTIONS: 1. No work until October 18, 2011. 2. Increase activity slowly.  May shower.  No lifting for 1 week.  No     driving for 2 days. 3. Low-sodium heart healthy diet. 4. Wash cath site with soap and water.  Call if any bleeding,     swelling, or drainage. 5. Follow up with Dr. Rennis Golden on August 25, 2011 at 4:00 p.m.  HOSPITAL COURSE:  The patient was admitted, placed on IV heparin. Cardiac enzymes were negative.  CKs ranged 48, MB 2.4 and troponin I less than 0.30.  BNP was on admission 203.  The patient remained pain free, underwent cardiac catheterization.  The stent was found to be patent, but her LVEDP was elevated at 26 mmHg. Plan to be was to resume the valsartan, which she will continue it.  LABORATORY DATA AT DISCHARGE:  Sodium 134, potassium 3.8, BUN 12, creatinine 0.79, glucose 97.  Hemoglobin 11.4, hematocrit 32.9, platelets 180.  WBC 4.3.  Lipids:  Total cholesterol 131, triglycerides 52, HDL 71, LDL 50.  TSH 2.779.  UA was clear.  Liver function studies were normal and magnesium was 2.0.  Please note, hemoglobin A1c was 5.3.  RADIOLOGY:  CT angio of the chest negative for pulmonary embolus. Thoracic aorta was normal in caliber.  No thoracic aneurysm or dissection.  Mild cardiomegaly and coronary atherosclerosis.  Lungs were clear.  On chest x-ray from October 20, negative chest.  The patient will follow up as instructed.     Theresa Mccarty. Theresa Mccarty, N.P.   ______________________________ Theresa Freeland Pracht, MD    LRI/MEDQ  D:  10/11/2011  T:  10/11/2011  Job:  [id number]  cc:   Theresa Christionna Poland, MD  Electronically Signed by Theresa Mccarty N.P. on 10/18/2011 11:29:36 AM Electronically Signed by Kirtland Bouchard. Madalina Rosman M.D. on 10/19/2011 08:16:51 AM Electronically Signed by K. Courtland Coppa M.D. on 10/19/2011 08:16:56  AM

## 2011-11-29 ENCOUNTER — Other Ambulatory Visit (HOSPITAL_COMMUNITY): Payer: Self-pay | Admitting: Urology

## 2011-11-29 ENCOUNTER — Ambulatory Visit (HOSPITAL_COMMUNITY)
Admission: RE | Admit: 2011-11-29 | Discharge: 2011-11-29 | Disposition: A | Discharge status: Home / Self Care | Payer: Medicare Other | Source: Ambulatory Visit | Attending: Urology | Admitting: Urology

## 2011-11-29 DIAGNOSIS — C649 Malignant neoplasm of unspecified kidney, except renal pelvis: Secondary | ICD-10-CM

## 2011-11-29 DIAGNOSIS — I517 Cardiomegaly: Secondary | ICD-10-CM | POA: Insufficient documentation

## 2012-06-15 ENCOUNTER — Ambulatory Visit (HOSPITAL_COMMUNITY)
Admission: RE | Admit: 2012-06-15 | Discharge: 2012-06-15 | Disposition: A | Discharge status: Home / Self Care | Payer: Medicare Other | Source: Ambulatory Visit | Attending: Urology | Admitting: Urology

## 2012-06-15 ENCOUNTER — Other Ambulatory Visit (HOSPITAL_COMMUNITY): Payer: Self-pay | Admitting: Urology

## 2012-06-15 DIAGNOSIS — C649 Malignant neoplasm of unspecified kidney, except renal pelvis: Secondary | ICD-10-CM

## 2012-06-15 DIAGNOSIS — I10 Essential (primary) hypertension: Secondary | ICD-10-CM | POA: Insufficient documentation

## 2012-07-11 ENCOUNTER — Telehealth: Payer: Self-pay | Admitting: Family Medicine

## 2012-07-11 MED ORDER — LEVOTHYROXINE SODIUM 50 MCG PO TABS: 50.0000 ug | ORAL_TABLET | Freq: Every day | ORAL | Status: DC

## 2012-07-11 NOTE — Telephone Encounter (Signed)
Pt called needs refill for Levothroxine 50 mcg 1 qd at [facility].  Pt will be in next week for cpe with [facility].

## 2012-07-19 ENCOUNTER — Other Ambulatory Visit: Payer: Self-pay | Admitting: Interventional Radiology

## 2012-07-19 DIAGNOSIS — N2889 Other specified disorders of kidney and ureter: Secondary | ICD-10-CM

## 2012-07-19 DIAGNOSIS — C649 Malignant neoplasm of unspecified kidney, except renal pelvis: Secondary | ICD-10-CM

## 2012-08-02 ENCOUNTER — Encounter: Payer: Self-pay | Admitting: Internal Medicine

## 2012-08-09 ENCOUNTER — Ambulatory Visit (INDEPENDENT_AMBULATORY_CARE_PROVIDER_SITE_OTHER): Payer: Medicare Other | Admitting: Family Medicine

## 2012-08-09 ENCOUNTER — Encounter: Payer: Self-pay | Admitting: Family Medicine

## 2012-08-09 ENCOUNTER — Encounter: Payer: Medicare Other | Admitting: Family Medicine

## 2012-08-09 VITALS — BP 138/80 | HR 68 | Ht 62.0 in | Wt 173.0 lb

## 2012-08-09 DIAGNOSIS — Z0000 Encounter for general adult medical examination without abnormal findings: Secondary | ICD-10-CM

## 2012-08-09 DIAGNOSIS — Z23 Encounter for immunization: Secondary | ICD-10-CM

## 2012-08-09 DIAGNOSIS — Z01419 Encounter for gynecological examination (general) (routine) without abnormal findings: Secondary | ICD-10-CM

## 2012-08-09 DIAGNOSIS — D649 Anemia, unspecified: Secondary | ICD-10-CM

## 2012-08-09 DIAGNOSIS — I10 Essential (primary) hypertension: Secondary | ICD-10-CM | POA: Insufficient documentation

## 2012-08-09 DIAGNOSIS — E7800 Pure hypercholesterolemia, unspecified: Secondary | ICD-10-CM | POA: Insufficient documentation

## 2012-08-09 DIAGNOSIS — E039 Hypothyroidism, unspecified: Secondary | ICD-10-CM | POA: Insufficient documentation

## 2012-08-09 DIAGNOSIS — G4733 Obstructive sleep apnea (adult) (pediatric): Secondary | ICD-10-CM

## 2012-08-09 DIAGNOSIS — Z9989 Dependence on other enabling machines and devices: Secondary | ICD-10-CM | POA: Insufficient documentation

## 2012-08-09 DIAGNOSIS — I2510 Atherosclerotic heart disease of native coronary artery without angina pectoris: Secondary | ICD-10-CM | POA: Insufficient documentation

## 2012-08-09 DIAGNOSIS — M810 Age-related osteoporosis without current pathological fracture: Secondary | ICD-10-CM | POA: Insufficient documentation

## 2012-08-09 DIAGNOSIS — Z9861 Coronary angioplasty status: Secondary | ICD-10-CM | POA: Insufficient documentation

## 2012-08-09 LAB — CBC WITH DIFFERENTIAL/PLATELET
Basophils Absolute: 0 10*3/uL (ref 0.0–0.1)
Eosinophils Absolute: 0.1 10*3/uL (ref 0.0–0.7)
Eosinophils Relative: 3 % (ref 0–5)
HCT: 34.9 % — ABNORMAL LOW (ref 36.0–46.0)
Lymphocytes Relative: 21 % (ref 12–46)
Lymphs Abs: 0.8 10*3/uL (ref 0.7–4.0)
MCH: 32.6 pg (ref 26.0–34.0)
MCV: 93.3 fL (ref 78.0–100.0)
Monocytes Absolute: 0.4 10*3/uL (ref 0.1–1.0)
Platelets: 218 10*3/uL (ref 150–400)
RDW: 13.5 % (ref 11.5–15.5)
WBC: 3.7 10*3/uL — ABNORMAL LOW (ref 4.0–10.5)

## 2012-08-09 LAB — POCT URINALYSIS DIPSTICK
Bilirubin, UA: NEGATIVE
Ketones, UA: NEGATIVE
Leukocytes, UA: NEGATIVE

## 2012-08-09 NOTE — Progress Notes (Signed)
Chief Complaint  Patient presents with  . Annual Exam    fasting annual exam with pap. And needs med refills on thyroid medication.   She presents for CPE, as well as med check for her chronic medical problems.  Since she was seen in this office last year (twice, once for med check, followed by [facility]) she had a colonoscopy showing hemorrhoids an diverticulosis.  She was admitted for unstable angina in 09/2011 and underwent cardiac catheterization.  Hypertension follow-up:  Blood pressures elsewhere are 128/78-140s/80's, higher if stressful day at work.  Denies dizziness, headaches, chest pain.  Denies side effects of medications. Denies edema.  CAD: followed by Dr. Rennis Golden, last seen a couple of months ago (no records in chart). No med changes were made.  She reports lipids haven't been checked since October in hospital.  Hypothyroidism:  Has had trouble losing weight, has gained a few pounds.  Denies hair/skin/bowel changes.  OSA: compliant with using CPAP nightly.  Osteoporosis:  Previously treated with fosamax and Boniva.  She has been off meds for at least 3-4 years.  She doesn't recall why the medication was stopped, denies side effects.  Last DEXA was in 2009, while still taking Boniva.  Hyperlipidemia follow-up:  Patient is reportedly following a low-fat, low cholesterol diet.  Compliant with medications and denies medication side effects  Old records reviewed--last CBC 09/2011, Hg 11.4 (10.4 the day prior--during hospitalization).  Last b-met during October hospitalization showing sodiums 127-134.  TSH 2.779.  Last lipid 09/2011 chol 131, TG 52, LDL 50, HDL 71  Health maintenance: Immunization History  Administered Date(s) Administered  . Td 01/04/2006  she believes she had pneumovax but there is no record in chart, and she doesn't know date (ie if before age 31) Last Pap smear: 2009;  S/p hysterectomy in '75 Last mammogram: 12/2011 Last colonoscopy: 07/26/11 Last DEXA: 1/09 (still  on Boniva) Dentist: yearly, but is past due Ophtho: many years ago Exercise: treadmill 30-45 minutes 6x/week, 2.5 mph  Past Medical History  Diagnosis Date  . Hypertension   . Osteoporosis   . Back pain, chronic   . Hypothyroid   . CAD (coronary artery disease)   . Impaired fasting glucose   . Sleep apnea     on CPAP  . Broken leg 1958  . Renal cell carcinoma right; 2011    Dr. Laverle Patter, [facility]  . Hyperlipidemia     Past Surgical History  Procedure Date  . Abdominal hysterectomy 1975    PARTIAL; part of 1 ovary remains  . Spine surgery   . Coronary angioplasty with stent placement 2008  . Kidney surgery 2011    Renal carcinoma -  Dr. Laverle Patter and Dr. Retta Mac (cryoablation)    History   Social History  . Marital Status: Married    Spouse Name: N/A    Number of Children: N/A  . 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 Smoking status: Former Smoker    Quit date: 12/19/1968  . Smokeless tobacco: Never Used  . Alcohol Use: 1.2 oz/week    2 Glasses of wine per week     2 glasses of wine per day.  . Drug Use: No  . Sexually Active: Not Currently   Other Topics Concern  . Not on file   Social History Narrative   Lives with husband.  2 sons live together, near [employer]  Family History  Problem Relation Age of Onset  . Heart disease Mother   . Hypertension Mother   . Arthritis Mother   . Osteoporosis Mother   . Depression Sister   . Hyperlipidemia Sister   . Heart disease Father   . Diabetes Brother   . Cancer Sister     unsure, related to smoking?  . Cancer Sister     lung cancer  . Hyperlipidemia Brother   . Hypertension Brother     Current outpatient prescriptions:amLODipine (NORVASC) 5 MG tablet, Take 5 mg by mouth daily., Disp: , Rfl: ;  aspirin 81 MG tablet, Take 81 mg by mouth daily.  , Disp: , Rfl: ;  atorvastatin (LIPITOR) 20 MG tablet, Take 20 mg by mouth  daily., Disp: , Rfl: ;  B Complex Vitamins (B-COMPLEX/B-12 PO), Take 1 tablet by mouth daily.  , Disp: , Rfl: ;  Calcium-Magnesium-Zinc 500-250-12.5 MG TABS, Take 1 tablet by mouth daily., Disp: , Rfl:  Cholecalciferol (VITAMIN D3) 5000 UNITS CAPS, Take 1 capsule by mouth daily.  , Disp: , Rfl: ;  levothyroxine (SYNTHROID, LEVOTHROID) 50 MCG tablet, Take 1 tablet (50 mcg total) by mouth daily., Disp: 30 tablet, Rfl: 0;  valsartan-hydrochlorothiazide (DIOVAN-HCT) 320-25 MG per tablet, Take 1 tablet by mouth daily., Disp: , Rfl: ;  atorvastatin (LIPITOR) 20 MG tablet, Take 1 tablet (20 mg total) by mouth daily., Disp: 30 tablet, Rfl: 0  No Known Allergies  The patient denies anorexia, fever, headaches,  vision changes, ear pain, sore throat, breast concerns, chest pain, palpitations, dizziness, syncope, dyspnea on exertion, cough, swelling, nausea, vomiting, diarrhea, constipation, abdominal pain, melena, hematochezia, indigestion/heartburn, hematuria, incontinence, dysuria, irregular menstrual cycles, vaginal discharge, odor or itch, genital lesions, joint pains, numbness, tingling, weakness, tremor, suspicious skin lesions, depression, anxiety, abnormal bleeding/bruising, or enlarged lymph nodes. Has hearing loss (trying to find affordable hearing aid), and some tinnitus. Pain in wrist, knees, ankles Wart on her left shin, not improving with OTC meds.  Present x 1-2 months.  PHYSICAL EXAM: BP 138/80  Pulse 68  Ht 5\' 2"  (1.575 m)  Wt 173 lb (78.472 kg)  BMI 31.64 kg/m2  General Appearance:    Alert, cooperative, no distress, appears stated age  Head:    Normocephalic, without obvious abnormality, atraumatic  Eyes:    PERRL, conjunctiva/corneas clear, EOM's intact, fundi    benign  Ears:    Normal TM's and external ear canals  Nose:   Nares normal, mucosa normal, no drainage or sinus   tenderness  Throat:   Lips, mucosa, and tongue normal; teeth and gums normal  Neck:   Supple, no  lymphadenopathy;  thyroid:  no   enlargement/tenderness/nodules; no carotid   bruit or JVD  Back:    Spine nontender, no curvature, ROM normal, no CVA     tenderness  Lungs:     Clear to auscultation bilaterally without wheezes, rales or     ronchi; respirations unlabored  Chest Wall:    No tenderness or deformity   Heart:    Regular rate and rhythm, S1 and S2 normal, no murmur, rub   or gallop  Breast Exam:    No tenderness, masses, or nipple discharge or inversion.      No axillary lymphadenopathy  Abdomen:     Soft, non-tender, nondistended, normoactive bowel sounds,    no masses, no hepatosplenomegaly  Genitalia:    Normal external genitalia without lesions, some atrophic changes noted.  BUS and vagina normal. No  abnormal vaginal discharge.  Uterus surgically absent and adnexa not palpable--nontender, no masses.  Pap not performed  Rectal:    Normal tone, no masses or tenderness; guaiac negative stool  Extremities:   No clubbing, cyanosis or edema  Pulses:   2+ and symmetric all extremities  Skin:   Skin color, texture, turgor normal, no rashes.  L shin--verrucous, hyperkeratotic lesion.  Lymph nodes:   Cervical, supraclavicular, and axillary nodes normal  Neurologic:   CNII-XII intact, normal strength, sensation and gait; reflexes 2+ and symmetric throughout          Psych:   Normal mood, affect, hygiene and grooming.     ASSESSMENT/PLAN: 1. Routine general medical examination at a health care facility  POCT Urinalysis Dipstick, Visual acuity screening  2. CAD (coronary artery disease)    3. Essential hypertension, benign  Comprehensive metabolic panel  4. Pure hypercholesterolemia  Lipid panel, Comprehensive metabolic panel  5. OSA (obstructive sleep apnea)    6. Osteoporosis    7. Need for Tdap vaccination  Tdap vaccine greater than or equal to 7yo IM  8. Need for pneumococcal vaccination  Pneumococcal polysaccharide vaccine 23-valent greater than or equal to 2yo subcutaneous/IM    9. Anemia, unspecified  CBC with Differential  10. Unspecified hypothyroidism  TSH   Osteoporosis--off meds.  Due for DEXA, prev done at [facility], now needs to schedule [facility].  Chart reviewed--no documentation of having given pneumovax here (not at age 61 or at CPE in 1/09. Given pneumovax and TdaP given today. Recommended flu shots yearly Risks and benefits of pneumovax, TdaP and zostavax discussed.  Encouraged to get zostavax at pharmacy, rx written.  Discussed monthly self breast exams and yearly mammograms after the age of 56; at least 30 minutes of aerobic activity at least 5 days/week; proper sunscreen use reviewed; healthy diet, including goals of calcium and vitamin D intake and alcohol recommendations (less than or equal to 1 drink/day) reviewed; regular seatbelt use; changing batteries in smoke detectors.  Immunization recommendations discussed as above.  Colonoscopy recommendations reviewed, UTD  Return for treatment of wart if desired (ie if not improving with OTC measures) Encouraged to schedule eye exam

## 2012-08-09 NOTE — Patient Instructions (Addendum)
HEALTH MAINTENANCE RECOMMENDATIONS:  It is recommended that you get at least 30 minutes of aerobic exercise at least 5 days/week (for weight loss, you may need as much as 60-90 minutes). This can be any activity that gets your heart rate up. This can be divided in 10-15 minute intervals if needed, but try and build up your endurance at least once a week.  Weight bearing exercise is also recommended twice weekly.  Eat a healthy diet with lots of vegetables, fruits and fiber.  "Colorful" foods have a lot of vitamins (ie green vegetables, tomatoes, red peppers, etc).  Limit sweet tea, regular sodas and alcoholic beverages, all of which has a lot of calories and sugar.  Up to 1 alcoholic drink daily may be beneficial for women (unless trying to lose weight, watch sugars).  Drink a lot of water.  Calcium recommendations are 1200-1500 mg daily (1500 mg for postmenopausal women or women without ovaries), and vitamin D 1000 IU daily.  This should be obtained from diet and/or supplements (vitamins), and calcium should not be taken all at once, but in divided doses.  Monthly self breast exams and yearly mammograms for women over the age of 86 is recommended.  Sunscreen of at least SPF 30 should be used on all sun-exposed parts of the skin when outside between the hours of 10 am and 4 pm (not just when at beach or pool, but even with exercise, golf, tennis, and yard work!)  Use a sunscreen that says "broad spectrum" so it covers both UVA and UVB rays, and make sure to reapply every 1-2 hours.  Remember to change the batteries in your smoke detectors when changing your clock times in the spring and fall.  Use your seat belt every time you are in a car, and please drive safely and not be distracted with cell phones and texting while driving.  Please schedule a routine eye exam, as well as dental cleaning. I recommend that you get shingles vaccine (zostavax) through a pharmacy

## 2012-08-10 LAB — COMPREHENSIVE METABOLIC PANEL
ALT: 17 U/L (ref 0–35)
Alkaline Phosphatase: 59 U/L (ref 39–117)
BUN: 10 mg/dL (ref 6–23)
CO2: 27 mEq/L (ref 19–32)
Chloride: 97 mEq/L (ref 96–112)
Creat: 0.69 mg/dL (ref 0.50–1.10)
Glucose, Bld: 79 mg/dL (ref 70–99)
Potassium: 4 mEq/L (ref 3.5–5.3)
Total Bilirubin: 0.8 mg/dL (ref 0.3–1.2)
Total Protein: 6.3 g/dL (ref 6.0–8.3)

## 2012-08-10 LAB — LIPID PANEL: Cholesterol: 149 mg/dL (ref 0–200)

## 2012-08-10 LAB — TSH: TSH: 1.871 u[IU]/mL (ref 0.350–4.500)

## 2012-08-10 MED ORDER — LEVOTHYROXINE SODIUM 50 MCG PO TABS: 50.0000 ug | ORAL_TABLET | Freq: Every day | ORAL | Status: DC

## 2012-10-20 LAB — CREATININE WITH EST GFR
Creat: 0.91 mg/dL (ref 0.50–1.10)
GFR, Est African American: 73 mL/min

## 2012-10-23 ENCOUNTER — Ambulatory Visit (HOSPITAL_COMMUNITY)
Admission: RE | Admit: 2012-10-23 | Discharge: 2012-10-23 | Disposition: A | Discharge status: Home / Self Care | Payer: Medicare Other | Source: Ambulatory Visit | Attending: Interventional Radiology | Admitting: Interventional Radiology

## 2012-10-23 ENCOUNTER — Ambulatory Visit
Admission: RE | Admit: 2012-10-23 | Discharge: 2012-10-23 | Disposition: A | Discharge status: Home / Self Care | Payer: Medicare Other | Source: Ambulatory Visit | Attending: Interventional Radiology | Admitting: Interventional Radiology

## 2012-10-23 VITALS — BP 155/75 | HR 64 | Temp 98.2°F | Resp 15 | Ht 62.0 in | Wt 165.0 lb

## 2012-10-23 DIAGNOSIS — C649 Malignant neoplasm of unspecified kidney, except renal pelvis: Secondary | ICD-10-CM

## 2012-10-23 MED ORDER — IOHEXOL 300 MG/ML  SOLN
100.0000 mL | Freq: Once | INTRAMUSCULAR | Status: AC | PRN
  Administered 2012-10-23: 100 mL via INTRAVENOUS

## 2012-10-23 NOTE — Progress Notes (Signed)
Denies hematuria or any other problems w/ urination.  Denies Right flank pain or other discomfort associated with procedure.    Last office visit w/ Dr Laverle Patter on 06/29/2012.    Continues to work full time M[employer]).

## 2012-10-24 NOTE — Progress Notes (Signed)
Patient ID: Theresa Mccarty, female   DOB: 1940-12-27, 71 y.o.   MRN: [id number]  ESTABLISHED PATIENT OFFICE VISIT  Chief Complaint: Status post percutaneous cryoablation of a right renal cell carcinoma on 07/30/2010.  History: Mrs. Pol has been doing well and is without complaints. She recently worked extensively at the [employer] and [employer]  Review of Systems: No hematuria or dysuria. No abdominal or flank pain. No fever or chills.  Exam: Vital signs: Blood pressure 155/75, pulse 63, respirations 15, temperature 98.2, oxygen saturation 98% on room air. Abdomen: Soft and nontender. No flank tenderness.  Labs: BUN 22, creatinine 0.91 and estimated GFR 64 ml/minute on 10/19/2012.  Imaging: Follow-up CT performed today demonstrates slight retraction of the post ablation scar tissue in the right upper kidney. There is no evidence of significant enhancement to suggest viable tumor. Other cystic lesions bilaterally are stable and consistent with benign cysts.  Assessment and Plan: No evidence of recurrence of right renal cell carcinoma after percutaneous ablation. The patient is now 2 years post ablation. I have recommended another follow-up scan in 1 year.

## 2012-10-31 LAB — HM DEXA SCAN

## 2012-11-02 ENCOUNTER — Encounter: Payer: Self-pay | Admitting: Family Medicine

## 2013-02-11 ENCOUNTER — Telehealth: Payer: Self-pay | Admitting: Internal Medicine

## 2013-02-11 NOTE — Telephone Encounter (Signed)
Okay to write rx for CPAP for dx of OSA

## 2013-02-12 ENCOUNTER — Telehealth: Payer: Self-pay | Admitting: Family Medicine

## 2013-02-12 NOTE — Telephone Encounter (Signed)
Millie with [facility] called and states that she needs another rx written that shows:  Swift FX Mudlogger System and Tubing and Chamber for Humidifier.  Please fax this along with the 08/09/12 CPE note to Millie at [phone].

## 2013-02-13 NOTE — Telephone Encounter (Signed)
Done. Faxed.

## 2013-02-15 ENCOUNTER — Telehealth: Payer: Self-pay | Admitting: Family Medicine

## 2013-02-15 NOTE — Telephone Encounter (Signed)
LM

## 2013-02-20 ENCOUNTER — Encounter: Payer: Self-pay | Admitting: Family Medicine

## 2013-02-20 ENCOUNTER — Ambulatory Visit (INDEPENDENT_AMBULATORY_CARE_PROVIDER_SITE_OTHER): Payer: Medicare Other | Admitting: Family Medicine

## 2013-02-20 VITALS — BP 150/80 | HR 60 | Ht 62.0 in | Wt 172.0 lb

## 2013-02-20 DIAGNOSIS — G4733 Obstructive sleep apnea (adult) (pediatric): Secondary | ICD-10-CM

## 2013-02-20 DIAGNOSIS — I10 Essential (primary) hypertension: Secondary | ICD-10-CM

## 2013-02-20 NOTE — Patient Instructions (Addendum)
Doubt yeast infection--likely related to moisture, and/or postmenopausal atrophic changes. Consider wearing pantiliner, which can be changed frequently to keep the skin in vaginal area dry.  Also use powder to keep the area dry  If you develop vaginal discharge with itching (especially internal), then can call for diflucan if OTC yeast infection treatments such as monistat or ineffective

## 2013-02-20 NOTE — Progress Notes (Signed)
Chief Complaint  Patient presents with  . Advice Only    needs OV for CPAP, broke CPAP mask and patient was last seen 08/09/2012.   Medicare requires office visits with notes every 6 months provided to [facility].  Orders were already faxed last week, but last OV was >6 months ago.  The mask broke, and needs replacement.  Prior to the mask breaking, she was compliant with using CPAP nightly, and denied any problems.  Feels refreshed in the mornings, denies headaches or daytime sleepiness.  She has been using a nasal attachment until she is able to get the proper mask (requires this visit today before they will give her), and she has been having some nasal irritation from this mask.  Complaining of vaginal itching (actually complaining of "yeast infection").  Initially had very slight discharge.  Used OTC yeast meds, which helped some, but recurred.  No recent antibiotic use.  Used baby oil once with good relief of symptoms. Underwear is moist frequently due to perspiration.  Wears cotton underwear. Itching seemed better when using baby powder.  HTN--BP's have been a little higher lately.  Not exercising like normal due to a "lot going on" and less exercise due to knee injury.  Usually high 130's/low 80's  Past Medical History  Diagnosis Date  . Hypertension   . Osteoporosis   . Back pain, chronic   . Hypothyroid   . CAD (coronary artery disease)   . Impaired fasting glucose   . Sleep apnea     on CPAP  . Broken leg 1958  . Renal cell carcinoma right; 2011    Dr. Laverle Patter, [facility]  . Hyperlipidemia    Past Surgical History  Procedure Laterality Date  . Abdominal hysterectomy  1975    PARTIAL; part of 1 ovary remains  . Spine surgery    . Coronary angioplasty with stent placement  2008  . Kidney surgery  2011    Renal carcinoma -  Dr. Laverle Patter and Dr. Retta Mac (cryoablation)   History   Social History  . Marital Status: Married    Spouse Name: N/A   Number of Children: N/A  . 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 Smoking status: Former Smoker    Quit date: 12/19/1968  . Smokeless tobacco: Never Used  . Alcohol Use: 1.2 oz/week    2 Glasses of wine per week     Comment: 2 glasses of wine per day.  . Drug Use: No  . Sexually Active: Not Currently   Other Topics Concern  . Not on file   Social History Narrative   Lives with husband.  2 sons live together, near [place]   Current outpatient prescriptions:amLODipine (NORVASC) 5 MG tablet, Take 5 mg by mouth daily., Disp: , Rfl: ;  aspirin 81 MG tablet, Take 81 mg by mouth daily.  , Disp: , Rfl: ;  atorvastatin (LIPITOR) 20 MG tablet, Take 20 mg by mouth daily., Disp: , Rfl: ;  B Complex Vitamins (B-COMPLEX/B-12 PO), Take 1 tablet by mouth daily.  , Disp: , Rfl: ;  Calcium-Magnesium-Zinc 500-250-12.5 MG TABS, Take 1 tablet by mouth daily., Disp: , Rfl:  Cholecalciferol (VITAMIN D3) 5000 UNITS CAPS, Take 1 capsule by mouth daily.  , Disp: , Rfl: ;  levothyroxine (SYNTHROID, LEVOTHROID) 50 MCG tablet, Take 1 tablet (50 mcg total) by mouth daily., Disp: 90 tablet, Rfl:  3;  valsartan-hydrochlorothiazide (DIOVAN-HCT) 320-25 MG per tablet, Take 1 tablet by mouth daily., Disp: , Rfl:   No Known Allergies  ROS: +congestion/runny nose.  Renovating at [employer], works at [employer]. + dust exposure. Denies chest pain, fever, cough, shortness of breath (some with a lot of walking, but none on treadmill ever).  PHYSICAL EXAM: BP 150/80  Pulse 60  Ht 5\' 2"  (1.575 m)  Wt 172 lb (78.019 kg)  BMI 31.45 kg/m2  Well developed, pleasant overweight female in no distress.  +sniffling Neck: no lymphadenopathy or mass Heart: regular rate and rhythm without murmur Lungs: clear bilaterally Skin: no rashes Psych: normal mood, affect, hygiene and grooming  ASSESSMENT/PLAN:  OSA (obstructive sleep apnea)  Essential  hypertension, benign  Compliant with CPAP use--not tolerating current mask as well.  Needs to get new mask which fits over nose.  Elevated BP--continue to monitor, low sodium diet, daily exercise.  If BP's remain elevated, make sure to follow up with cardiologist for med adjustments.  Consider partly related to change in CPAP (less tolerant with current mask, ?change in efficacy)  Doubt yeast infection--likely related to moisture, and/or postmenopausal atrophic changes. Consider wearing pantiliner, which can be changed frequently to keep the skin in vaginal area dry.  Also use powder to keep the area dry

## 2013-02-22 ENCOUNTER — Encounter: Payer: Self-pay | Admitting: Family Medicine

## 2013-05-06 ENCOUNTER — Telehealth: Payer: Self-pay | Admitting: Internal Medicine

## 2013-05-06 ENCOUNTER — Other Ambulatory Visit: Payer: Self-pay | Admitting: Pharmacist

## 2013-05-06 MED ORDER — VALSARTAN-HYDROCHLOROTHIAZIDE 320-25 MG PO TABS: 1.0000 | ORAL_TABLET | Freq: Every day | ORAL | Status: DC

## 2013-05-06 NOTE — Telephone Encounter (Signed)
Need new prescripiions to Clopidogrel 75mg  #30,Amlodipine Besylate 5mg  #30 and Valsartan-HCTZ 320-25#30-Call to [phone]!

## 2013-05-10 MED ORDER — AMLODIPINE BESYLATE 5 MG PO TABS: 5.0000 mg | ORAL_TABLET | Freq: Every day | ORAL | Status: DC

## 2013-05-10 MED ORDER — CLOPIDOGREL BISULFATE 75 MG PO TABS: 75.0000 mg | ORAL_TABLET | Freq: Every day | ORAL | Status: DC

## 2013-05-10 MED ORDER — VALSARTAN-HYDROCHLOROTHIAZIDE 320-25 MG PO TABS: 1.0000 | ORAL_TABLET | Freq: Every day | ORAL | Status: DC

## 2013-05-10 NOTE — Telephone Encounter (Signed)
Refills sent to pharmacy. 

## 2013-05-14 ENCOUNTER — Other Ambulatory Visit: Payer: Self-pay | Admitting: *Deleted

## 2013-05-14 MED ORDER — VALSARTAN-HYDROCHLOROTHIAZIDE 320-25 MG PO TABS: 1.0000 | ORAL_TABLET | Freq: Every day | ORAL | Status: DC

## 2013-06-03 ENCOUNTER — Other Ambulatory Visit: Payer: Self-pay | Admitting: *Deleted

## 2013-06-03 MED ORDER — ATORVASTATIN CALCIUM 20 MG PO TABS: 20.0000 mg | ORAL_TABLET | Freq: Every day | ORAL | Status: DC

## 2013-06-07 ENCOUNTER — Other Ambulatory Visit: Payer: Self-pay | Admitting: Pharmacist Clinician (PhC)/ Clinical Pharmacy Specialist

## 2013-06-07 MED ORDER — ATORVASTATIN CALCIUM 20 MG PO TABS: 20.0000 mg | ORAL_TABLET | Freq: Every day | ORAL | Status: DC

## 2013-06-26 ENCOUNTER — Other Ambulatory Visit: Payer: Self-pay | Admitting: *Deleted

## 2013-06-26 ENCOUNTER — Telehealth: Payer: Self-pay | Admitting: Internal Medicine

## 2013-06-26 DIAGNOSIS — E782 Mixed hyperlipidemia: Secondary | ICD-10-CM

## 2013-06-26 NOTE — Telephone Encounter (Signed)
Theresa Mccarty is needing a lab order mailed to her before her appointment on 07/11/13   Thanks

## 2013-06-26 NOTE — Telephone Encounter (Signed)
Message forwarded to Jenna, RN.  

## 2013-06-26 NOTE — Telephone Encounter (Signed)
Called pt to inform lab is ordered and lab slip will be mailed on 06/26/13.

## 2013-07-02 ENCOUNTER — Other Ambulatory Visit: Payer: Self-pay | Admitting: *Deleted

## 2013-07-02 ENCOUNTER — Telehealth: Payer: Self-pay | Admitting: Internal Medicine

## 2013-07-02 DIAGNOSIS — E782 Mixed hyperlipidemia: Secondary | ICD-10-CM

## 2013-07-02 NOTE — Telephone Encounter (Signed)
Message forwarded to J. Elkins, RN.  

## 2013-07-02 NOTE — Telephone Encounter (Signed)
Called patient back about lab work questions. Informed patient that we can re-order and mail to her a lab slip, but that she would need to contact other doctor's offices about which lab work they have ordered. Patient verbalized understanding. Lab ordered and mailed to patient.

## 2013-07-02 NOTE — Telephone Encounter (Signed)
Would like to know if someone can call Dr.Borden office and see which labs they want for so that when she go have labs done for our office they can get done at the same time .Trying to avoid have lab work done 3 times within one month .Marland KitchenPlease Call  Thanks

## 2013-07-10 ENCOUNTER — Encounter: Payer: Self-pay | Admitting: Internal Medicine

## 2013-07-10 ENCOUNTER — Encounter: Payer: Self-pay | Admitting: *Deleted

## 2013-07-10 LAB — NMR LIPOPROFILE WITH LIPIDS
HDL-C: 78 mg/dL (ref >=40)
LDL (calc): 81 mg/dL (ref <100)
LDL Particle Number: 631 nmol/L (ref <1000)
LDL Size: 21.1 nm (ref >20.5)
LP-IR Score: <25 (ref <=45)
Small LDL Particle Number: <90 nmol/L (ref <=527)
VLDL Size: 47.5 nm — ABNORMAL HIGH (ref <=46.6)

## 2013-07-11 ENCOUNTER — Encounter: Payer: Self-pay | Admitting: Internal Medicine

## 2013-07-11 ENCOUNTER — Ambulatory Visit (INDEPENDENT_AMBULATORY_CARE_PROVIDER_SITE_OTHER): Payer: Medicare Other | Admitting: Internal Medicine

## 2013-07-11 VITALS — BP 136/90 | HR 60 | Ht 63.0 in | Wt 171.9 lb

## 2013-07-11 DIAGNOSIS — E7800 Pure hypercholesterolemia, unspecified: Secondary | ICD-10-CM

## 2013-07-11 DIAGNOSIS — I2510 Atherosclerotic heart disease of native coronary artery without angina pectoris: Secondary | ICD-10-CM

## 2013-07-11 DIAGNOSIS — G4733 Obstructive sleep apnea (adult) (pediatric): Secondary | ICD-10-CM

## 2013-07-11 DIAGNOSIS — I10 Essential (primary) hypertension: Secondary | ICD-10-CM

## 2013-07-11 MED ORDER — AMLODIPINE BESYLATE 5 MG PO TABS: 5.0000 mg | ORAL_TABLET | Freq: Every day | ORAL | Status: DC

## 2013-07-11 MED ORDER — VALSARTAN-HYDROCHLOROTHIAZIDE 320-25 MG PO TABS: 1.0000 | ORAL_TABLET | Freq: Every day | ORAL | Status: DC

## 2013-07-11 MED ORDER — CLOPIDOGREL BISULFATE 75 MG PO TABS: 75.0000 mg | ORAL_TABLET | Freq: Every day | ORAL | Status: DC

## 2013-07-11 MED ORDER — ATORVASTATIN CALCIUM 20 MG PO TABS: 20.0000 mg | ORAL_TABLET | Freq: Every day | ORAL | Status: DC

## 2013-07-11 NOTE — Patient Instructions (Addendum)
Your physician wants you to follow-up in: 1 year. You will receive a reminder letter in the mail two months in advance. If you don't receive a letter, please call our office to schedule the follow-up appointment.  

## 2013-07-11 NOTE — Progress Notes (Signed)
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 Recently, she developed substernal chest pain in the fall and underwent cardiac catheterization. This demonstrated patent LAD stent that was proximal as well as 30% disease of the LAD just prior to that stent, but no other areas of obstructive coronary disease. She continued to have chest pain; however, I felt it was most likely related to stress. I have also encouraged dietary changes as well as trying to reduce her stress and alcohol intake. She has accomplished that to some extent and, over the past 6 months has had no other symptoms of chest discomfort. She occasionally gets short of breath with heavy exertion and has some swelling in her legs only when standing up for long periods of time. She continues to work at [employer] although there is some stress she is actually maintaining her exercise regimen. This is clearly demonstrated improvement in her risk factors including a reduction in cholesterol. A recent lipid NMR demonstrated a particle number of 631 and an LDL cholesterol of 81.   PMHx:  Past Medical History  Diagnosis Date  . Hypertension   . Osteoporosis   . Back pain, chronic   . Hypothyroid   . CAD (coronary artery disease)   . Impaired fasting glucose   . Sleep apnea     on CPAP  . Broken leg 1958  . Renal cell carcinoma right; 2011    Dr. Laverle Patter, [facility]  . Hyperlipidemia   . History of echocardiogram 09/11/2007    normal  . History of nuclear stress test 11/17/2010    exercise; small fixed anteroseptal defect (?artifact); short run of atrial-tachy during recovery; low risk    Past Surgical History  Procedure Laterality Date  . Abdominal hysterectomy  1975    PARTIAL; part of 1 ovary remains  . Spine surgery      . Coronary angioplasty with stent placement  2008    mid LAD  . Kidney surgery  2011    renal carcinoma - cryoablation -  Dr. Laverle Patter and Dr. Retta Mac (cryoablation)  . Cardiac catheterization  10/08/2011    LAD-prox stent patent; no significant obstructive CAD; hi LVF end-diastolic pressure    FAMHx:  Family History  Problem Relation Age of Onset  . Heart disease Mother   . Hypertension Mother   . Arthritis Mother   . Osteoporosis Mother   . Depression Sister   . Hyperlipidemia Sister   . Heart disease Father   . Diabetes Brother   . Cancer Sister     unsure, related to smoking?  . Cancer Sister     lung cancer  . Hyperlipidemia Brother   . Hypertension Brother     SOCHx:   reports that she quit smoking about 44 years ago. She has never used smokeless tobacco. She reports that she drinks about 1.2 ounces of alcohol per week. She reports that she does not use illicit drugs.  ALLERGIES:  No Known Allergies  ROS: A comprehensive review of systems was negative except for: Respiratory: positive for dyspnea on exertion Cardiovascular: positive for palpitations Musculoskeletal: positive for leg pain  HOME MEDS: Current Outpatient Prescriptions  Medication Sig Dispense Refill  . amLODipine (NORVASC) 5 MG tablet Take 1 tablet (5 mg total) by mouth daily.  30 tablet  2  . aspirin 81 MG tablet Take 81 mg by mouth daily.        [name] atorvastatin (LIPITOR) 20 MG tablet Take 1 tablet (20 mg total) by mouth daily.  30 tablet  1  . B Complex Vitamins (B-COMPLEX/B-12 PO) Take 1 tablet by mouth daily.        . Calcium-Magnesium-Zinc 500-250-12.5 MG TABS Take 1 tablet by mouth daily.      . clopidogrel (PLAVIX) 75 MG tablet Take 1 tablet (75 mg total) by mouth daily.  30 tablet  2  . levothyroxine (SYNTHROID, LEVOTHROID) 50 MCG tablet Take 1 tablet (50 mcg total) by mouth daily.  90 tablet  3  . valsartan-hydrochlorothiazide (DIOVAN-HCT) 320-25 MG per tablet Take 1 tablet by mouth daily.   30 tablet  2   No current facility-administered medications for this visit.    LABS/IMAGING: No results found for this or any previous visit (from the past 48 hour(s)). No results found.  VITALS: BP 136/90  Pulse 60  Ht 5\' 3"  (1.6 m)  Wt 171 lb 14.4 oz (77.973 kg)  BMI 30.46 kg/m2  EXAM: General appearance: alert and no distress Neck: no adenopathy, no carotid bruit, no JVD, supple, symmetrical, trachea midline and thyroid not enlarged, symmetric, no tenderness/mass/nodules Lungs: clear to auscultation bilaterally Heart: regular rate and rhythm, S1, S2 normal, no murmur, click, rub or gallop Abdomen: soft, non-tender; bowel sounds normal; no masses,  no organomegaly Extremities: extremities normal, atraumatic, no cyanosis or edema Pulses: 2+ and symmetric Skin: Skin color, texture, turgor normal. No rashes or lesions Neurologic: Grossly normal  EKG: Normal sinus rhythm at 60  ASSESSMENT: 1. Coronary artery disease status post PCI of the proximal LAD 2. Hypertension - controlled 3. Hyperlipidemia - controlled 4. Obstructive sleep apnea on CPAP  PLAN: 1.   [name] is doing excellent. Encourage her to continue work on exercise. She's had a several pound weight loss. Her cholesterol profile looks better than it has in a long time, however not low enough to consider decreasing her Lipitor. I recommend continuing her current medications and her exercise program.  She reports she is sleeping very well with CPAP.  Energy level is good.  Blood pressure is well-controlled. Plan to see her back annually.  Chrystie Nose, MD, [facility] Attending Cardiologist [facility]  Niccolas Loeper C 07/11/2013, 8:26 AM

## 2013-07-25 ENCOUNTER — Other Ambulatory Visit (HOSPITAL_COMMUNITY): Payer: Self-pay | Admitting: Urology

## 2013-07-25 ENCOUNTER — Ambulatory Visit (HOSPITAL_COMMUNITY)
Admission: RE | Admit: 2013-07-25 | Discharge: 2013-07-25 | Disposition: A | Discharge status: Home / Self Care | Payer: Medicare Other | Source: Ambulatory Visit | Attending: Urology | Admitting: Urology

## 2013-07-25 DIAGNOSIS — C649 Malignant neoplasm of unspecified kidney, except renal pelvis: Secondary | ICD-10-CM

## 2013-07-31 ENCOUNTER — Other Ambulatory Visit: Payer: Self-pay | Admitting: Family Medicine

## 2013-07-31 NOTE — Telephone Encounter (Signed)
Contact your Doctor for anymore refills.

## 2013-08-02 ENCOUNTER — Other Ambulatory Visit: Payer: Self-pay | Admitting: Family Medicine

## 2013-09-23 ENCOUNTER — Other Ambulatory Visit (HOSPITAL_COMMUNITY): Payer: Self-pay | Admitting: Interventional Radiology

## 2013-09-23 DIAGNOSIS — C641 Malignant neoplasm of right kidney, except renal pelvis: Secondary | ICD-10-CM

## 2013-09-24 ENCOUNTER — Telehealth: Payer: Self-pay | Admitting: Family Medicine

## 2013-09-24 NOTE — Telephone Encounter (Signed)
She is due for labs, including c-met, so this will be drawn at upcoming visit 10/29. (diagnosis renal cell CA)

## 2013-09-24 NOTE — Telephone Encounter (Signed)
Theresa Mccarty  [phone]  with [facility] requesting  Patient have BUN/creatine at her appointment with you on 10/16/13 for upcoming CT she is having in November, that way she will not have to make pt come in just for labs. They can then pull results from Epic  Dx; renal fail carcinoma

## 2013-10-16 ENCOUNTER — Encounter: Payer: Self-pay | Admitting: Family Medicine

## 2013-10-16 ENCOUNTER — Ambulatory Visit (INDEPENDENT_AMBULATORY_CARE_PROVIDER_SITE_OTHER): Payer: Medicare Other | Admitting: Family Medicine

## 2013-10-16 VITALS — BP 142/80 | HR 72 | Ht 62.5 in | Wt 168.0 lb

## 2013-10-16 DIAGNOSIS — M810 Age-related osteoporosis without current pathological fracture: Secondary | ICD-10-CM

## 2013-10-16 DIAGNOSIS — I10 Essential (primary) hypertension: Secondary | ICD-10-CM

## 2013-10-16 DIAGNOSIS — Z79899 Other long term (current) drug therapy: Secondary | ICD-10-CM

## 2013-10-16 DIAGNOSIS — R238 Other skin changes: Secondary | ICD-10-CM

## 2013-10-16 DIAGNOSIS — Z23 Encounter for immunization: Secondary | ICD-10-CM

## 2013-10-16 DIAGNOSIS — E7800 Pure hypercholesterolemia, unspecified: Secondary | ICD-10-CM

## 2013-10-16 DIAGNOSIS — E039 Hypothyroidism, unspecified: Secondary | ICD-10-CM

## 2013-10-16 DIAGNOSIS — I2510 Atherosclerotic heart disease of native coronary artery without angina pectoris: Secondary | ICD-10-CM

## 2013-10-16 DIAGNOSIS — G4733 Obstructive sleep apnea (adult) (pediatric): Secondary | ICD-10-CM

## 2013-10-16 LAB — COMPREHENSIVE METABOLIC PANEL
AST: 16 U/L (ref 0–37)
Albumin: 4 g/dL (ref 3.5–5.2)
Alkaline Phosphatase: 60 U/L (ref 39–117)
BUN: 14 mg/dL (ref 6–23)
Calcium: 9.2 mg/dL (ref 8.4–10.5)
Creat: 0.64 mg/dL (ref 0.50–1.10)
Glucose, Bld: 99 mg/dL (ref 70–99)
Total Bilirubin: 0.5 mg/dL (ref 0.3–1.2)

## 2013-10-16 LAB — CBC WITH DIFFERENTIAL/PLATELET
Hemoglobin: 11.9 g/dL — ABNORMAL LOW (ref 12.0–15.0)
Lymphs Abs: 0.8 10*3/uL (ref 0.7–4.0)
MCH: 33.1 pg (ref 26.0–34.0)
Monocytes Relative: 11 % (ref 3–12)
Neutro Abs: 2.1 10*3/uL (ref 1.7–7.7)
Neutrophils Relative %: 61 % (ref 43–77)
RBC: 3.6 MIL/uL — ABNORMAL LOW (ref 3.87–5.11)

## 2013-10-16 MED ORDER — NITROGLYCERIN 0.4 MG SL SUBL: 0.4000 mg | SUBLINGUAL_TABLET | SUBLINGUAL | Status: DC | PRN

## 2013-10-16 NOTE — Progress Notes (Signed)
Chief Complaint  Patient presents with  . Hypertension    fasting med check(blood is already drawn from this am and in the lab). Would like to be counseled on flu vaccine as she has never had one before. Would like to ask you about raw coconut oil. Also asking for sublingual nitro to be called in, thinks hers is expired.  . Hyperlipidemia  . Hypothyroidism    Hypertension follow-up: Blood pressures elsewhere are mid 130's-140 on days she works, 120's/70's when relaxed (ie a weekend, not at work). Denies dizziness, headaches, chest pain. Denies side effects of medications. Denies edema.  She gets occasional "gurgle" sensation in her chest, under left breast, which has been ongoing for longterm, unchanged and her cardiologist is aware.  CAD: followed by Dr. Rennis Golden, last seen in July. No med changes were made. Doing well, to see him yearly.  She needs refill on NTG--her bottle is over a year old. Hasn't needed to use it.  Hypothyroidism:  She has noticed some recent hair loss, but attributes it to increased work stress.  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  No skin or bowel changes.  Some intentional weight loss.   OSA: compliant with using CPAP nightly. It is working well, feel refreshed when she wakens in the morning.  No complications or problems with the equipment.  Osteoporosis: Previously treated with fosamax and Boniva. She has been off meds for at least 4-5 years (not stopped due to any side effect or problem that she can recall).  She had DEXA repeated 10/2012, showing osteopenia with T-2.4.  Hyperlipidemia follow-up: Patient is reportedly following a low-fat, low cholesterol diet. Compliant with medications and denies medication side effects.  Had NMR profile in July through Dr. Rennis Golden and was at goal.  She has easy bruising (on plavix and aspirin). She has taken coconut oil 1 teaspoon in her coffee, and did not bruise.  She only took it  once, wants to check that this isn't blocking the effects of her blood thinners  Past Medical History  Diagnosis Date  . Hypertension   . Osteoporosis   . Back pain, chronic   . Hypothyroid   . CAD (coronary artery disease)   . Impaired fasting glucose   . Sleep apnea     on CPAP  . Broken leg 1958  . Renal cell carcinoma right; 2011    Dr. Laverle Patter, [facility]  . Hyperlipidemia   . History of echocardiogram 09/11/2007    normal  . History of nuclear stress test 11/17/2010    exercise; small fixed anteroseptal defect (?artifact); short run of atrial-tachy during recovery; low risk   Past Surgical History  Procedure Laterality Date  . Abdominal hysterectomy  1975    PARTIAL; part of 1 ovary remains  . Spine surgery    . Coronary angioplasty with stent placement  2008    mid LAD  . Kidney surgery  2011    renal carcinoma - cryoablation -  Dr. Laverle Patter and Dr. Retta Mac (cryoablation)  . Cardiac catheterization  10/08/2011    LAD-prox stent patent; no significant obstructive CAD; hi LVF end-diastolic pressure   History   Social History  . Marital Status: Married    Spouse Name: N/A    Number of Children: 2  . 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 Smoking status: Former  Smoker    Quit date: 12/19/1968  . Smokeless tobacco: Never Used  . Alcohol Use: 1.2 oz/week    2 Glasses of wine per week     Comment: 2 glasses of wine per day.  . Drug Use: No  . Sexual Activity: Not Currently   Other Topics Concern  . Not on file   Social History Narrative   Lives with husband.  2 sons live together, near [place]   Current outpatient prescriptions:amLODipine (NORVASC) 5 MG tablet, Take 1 tablet (5 mg total) by mouth daily., Disp: 90 tablet, Rfl: 3;  aspirin 81 MG tablet, Take 81 mg by mouth daily.  , Disp: , Rfl: ;  atorvastatin (LIPITOR) 20 MG tablet, Take 1 tablet (20 mg total) by mouth daily.,  Disp: 90 tablet, Rfl: 3;  B Complex Vitamins (B-COMPLEX/B-12 PO), Take 1 tablet by mouth daily.  , Disp: , Rfl:  Calcium-Magnesium-Zinc 500-250-12.5 MG TABS, Take 1 tablet by mouth daily., Disp: , Rfl: ;  Cholecalciferol (VITAMIN D3) 5000 UNITS TABS, Take 5,000 Units by mouth daily., Disp: , Rfl: ;  clopidogrel (PLAVIX) 75 MG tablet, Take 1 tablet (75 mg total) by mouth daily., Disp: 90 tablet, Rfl: 3;  levothyroxine (SYNTHROID, LEVOTHROID) 50 MCG tablet, TAKE 1 TABLET (50 MCG TOTAL) BY MOUTH DAILY., Disp: 90 tablet, Rfl: 0 valsartan-hydrochlorothiazide (DIOVAN-HCT) 320-25 MG per tablet, Take 1 tablet by mouth daily., Disp: 90 tablet, Rfl: 3;  nitroGLYCERIN (NITROSTAT) 0.4 MG SL tablet, Place 1 tablet (0.4 mg total) under the tongue every 5 (five) minutes as needed for chest pain., Disp: 25 tablet, Rfl: 0  No Known Allergies  ROS:  Denies fevers, chills, nausea, vomiting, abdominal pain, diarrhea.  She has some allergies/sneezing related to the flowers at work.  Denies cough, shortness of breath, wheezing.  She continues to have some external mild vaginal itching--relieved by sporadic use of cortaid and/or baby oil. Denies vaginal discharge.  Denies headaches, dizziness, chest pan, significant edema, bleeding, bruising, rashes or other complaints except as noted in HPI  PHYSICAL EXAM: BP 142/80  Pulse 72  Ht 5' 2.5" (1.588 m)  Wt 168 lb (76.204 kg)  BMI 30.22 kg/m2 144/72 on repeat by MD, RA Well developed, pleasant overweight female in no distress Neck: no lymphadenopathy, thyromegaly or mass Heart: regular rate and rhythm, no murmur Lungs: clear bilaterally Abdomen soft, nontender, no organomegaly or mass Extremities: no edema, 2+ pulse Skin: Some bruising noted on forearms and hands Psych: normal mood, affect, hygiene and grooming Neuro: alert and oriented.  Cranial nerves intact.  Normal gait, strength  ASSESSMENT/PLAN:  Unspecified hypothyroidism - Plan: TSH  Pure  hypercholesterolemia - had NMR in July with Dr. Rennis Golden and was at goal - Plan: Comprehensive metabolic panel  OSA (obstructive sleep apnea) - well controlled with CPAP, continue  Essential hypertension, benign - controlled (borderline today). encouraged low sodium diet, daily exercise, and further weight loss - Plan: Comprehensive metabolic panel  CAD (coronary artery disease) - saw Dr. Rennis Golden for annual visit 06/2013; stable - Plan: nitroGLYCERIN (NITROSTAT) 0.4 MG SL tablet  Osteoporosis - last DEXA showed osteopenia, T-2.4 in 10/2012 - Plan: Vit D  25 hydroxy (rtn osteoporosis monitoring)  Encounter for long-term (current) use of other medications - Plan: CBC with Differential, Comprehensive metabolic panel, Vit D  25 hydroxy (rtn osteoporosis monitoring)  Need for prophylactic vaccination and inoculation against influenza - Plan: Flu vaccine HIGH DOSE PF (Fluzone Tri High dose)  Easy bruisability - most likely related to plavix  and ASA use. check CBC  c-met, tsh, CBC (low WBC in past, and v58.69 for being on Plavix and bruising)  Check regarding the raw coconut oil with the pharmacist and/or Dr. Rennis Golden (who prescribes the Plavix).  I am not familiar with it, but can try looking into it also.  Dr. Rennis Golden refilled BP and cholesterol meds x 1 year in July.  These conditions are well controlled. Will be out of thyroid medicaiton in 2 weeks  Counseled re: risks/benefits of flu shot

## 2013-10-16 NOTE — Patient Instructions (Addendum)
Check regarding the raw coconut oil with the pharmacist and/or Dr. Rennis Golden (who prescribes the Plavix).  I am not familiar with it, but can try looking into it also.  Continue all of your current medications. We will be in touch with your lab results (through MyChart if you are signed up, otherwise by phone or letter).  We will send in refill of your thyroid medication after labs are back.

## 2013-10-17 LAB — VITAMIN D 25 HYDROXY (VIT D DEFICIENCY, FRACTURES): Vit D, 25-Hydroxy: 91 ng/mL — ABNORMAL HIGH (ref 30–89)

## 2013-10-17 MED ORDER — LEVOTHYROXINE SODIUM 50 MCG PO TABS: ORAL_TABLET | ORAL | Status: DC

## 2013-10-31 ENCOUNTER — Ambulatory Visit (INDEPENDENT_AMBULATORY_CARE_PROVIDER_SITE_OTHER): Payer: Medicare Other | Admitting: Family Medicine

## 2013-10-31 ENCOUNTER — Encounter: Payer: Self-pay | Admitting: Family Medicine

## 2013-10-31 ENCOUNTER — Ambulatory Visit
Admission: RE | Admit: 2013-10-31 | Discharge: 2013-10-31 | Disposition: A | Discharge status: Home / Self Care | Payer: Medicare Other | Source: Ambulatory Visit | Attending: Interventional Radiology | Admitting: Interventional Radiology

## 2013-10-31 ENCOUNTER — Encounter (HOSPITAL_COMMUNITY): Payer: Self-pay

## 2013-10-31 ENCOUNTER — Ambulatory Visit
Admission: RE | Admit: 2013-10-31 | Discharge: 2013-10-31 | Disposition: A | Discharge status: Home / Self Care | Payer: Medicare Other | Source: Ambulatory Visit | Attending: Family Medicine | Admitting: Family Medicine

## 2013-10-31 ENCOUNTER — Ambulatory Visit (HOSPITAL_COMMUNITY)
Admission: RE | Admit: 2013-10-31 | Discharge: 2013-10-31 | Disposition: A | Discharge status: Home / Self Care | Payer: Medicare Other | Source: Ambulatory Visit | Attending: Interventional Radiology | Admitting: Interventional Radiology

## 2013-10-31 VITALS — BP 160/80 | HR 68 | Ht 62.0 in | Wt 165.0 lb

## 2013-10-31 DIAGNOSIS — S93409A Sprain of unspecified ligament of unspecified ankle, initial encounter: Secondary | ICD-10-CM

## 2013-10-31 DIAGNOSIS — C649 Malignant neoplasm of unspecified kidney, except renal pelvis: Secondary | ICD-10-CM | POA: Insufficient documentation

## 2013-10-31 DIAGNOSIS — N281 Cyst of kidney, acquired: Secondary | ICD-10-CM | POA: Insufficient documentation

## 2013-10-31 DIAGNOSIS — C641 Malignant neoplasm of right kidney, except renal pelvis: Secondary | ICD-10-CM

## 2013-10-31 DIAGNOSIS — M79609 Pain in unspecified limb: Secondary | ICD-10-CM

## 2013-10-31 DIAGNOSIS — M25572 Pain in left ankle and joints of left foot: Secondary | ICD-10-CM

## 2013-10-31 DIAGNOSIS — K7689 Other specified diseases of liver: Secondary | ICD-10-CM | POA: Insufficient documentation

## 2013-10-31 DIAGNOSIS — M25579 Pain in unspecified ankle and joints of unspecified foot: Secondary | ICD-10-CM

## 2013-10-31 DIAGNOSIS — M79605 Pain in left leg: Secondary | ICD-10-CM

## 2013-10-31 DIAGNOSIS — S93402A Sprain of unspecified ligament of left ankle, initial encounter: Secondary | ICD-10-CM

## 2013-10-31 MED ORDER — CELECOXIB 200 MG PO CAPS: 200.0000 mg | ORAL_CAPSULE | Freq: Every day | ORAL | Status: DC

## 2013-10-31 MED ORDER — IOHEXOL 300 MG/ML  SOLN
100.0000 mL | Freq: Once | INTRAMUSCULAR | Status: AC | PRN
  Administered 2013-10-31: 100 mL via INTRAVENOUS

## 2013-10-31 NOTE — Patient Instructions (Signed)

## 2013-10-31 NOTE — Progress Notes (Signed)
Chief Complaint  Patient presents with  . Fall    fell last night tripped over some bricks outside. Her left ankle and all the way up to her knee are hurting.    She was wearing clogs, and tripped over bricks after getting out of her car.  She hit a tree with her left shoulder, and was able to hold onto tree and pull herself up.  She did not fall to the ground, but twisted her left ankle and knee.  She is unable to bear weight.  She soaked in a hot bath last night, then iced it.  Hurting most at her ankle, but also having pain at her lateral knee.  Past Medical History  Diagnosis Date  . Hypertension   . Osteoporosis   . Back pain, chronic   . Hypothyroid   . CAD (coronary artery disease)   . Impaired fasting glucose   . Sleep apnea     on CPAP  . Broken leg 1958  . Renal cell carcinoma right; 2011    Dr. Laverle Patter, [facility]  . Hyperlipidemia   . History of echocardiogram 09/11/2007    normal  . History of nuclear stress test 11/17/2010    exercise; small fixed anteroseptal defect (?artifact); short run of atrial-tachy during recovery; low risk   Past Surgical History  Procedure Laterality Date  . Abdominal hysterectomy  1975    PARTIAL; part of 1 ovary remains  . Spine surgery    . Coronary angioplasty with stent placement  2008    mid LAD  . Kidney surgery  2011    renal carcinoma - cryoablation -  Dr. Laverle Patter and Dr. Retta Mac (cryoablation)  . Cardiac catheterization  10/08/2011    LAD-prox stent patent; no significant obstructive CAD; hi LVF end-diastolic pressure   History   Social History  . Marital Status: Married    Spouse Name: N/A    Number of Children: 2  . 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 Smoking status: Former Smoker    Quit date: 12/19/1968  . Smokeless tobacco: Never Used  . Alcohol Use: 1.2 oz/week    2 Glasses of wine per week     Comment: 2 glasses of wine per  day.  . Drug Use: No  . Sexual Activity: Not Currently   Other Topics Concern  . Not on file   Social History Narrative   Lives with husband.  2 sons live together, near [place]   Current outpatient prescriptions:amLODipine (NORVASC) 5 MG tablet, Take 1 tablet (5 mg total) by mouth daily., Disp: 90 tablet, Rfl: 3;  aspirin 81 MG tablet, Take 81 mg by mouth daily.  , Disp: , Rfl: ;  atorvastatin (LIPITOR) 20 MG tablet, Take 1 tablet (20 mg total) by mouth daily., Disp: 90 tablet, Rfl: 3;  B Complex Vitamins (B-COMPLEX/B-12 PO), Take 1 tablet by mouth daily.  , Disp: , Rfl:  Calcium-Magnesium-Zinc 500-250-12.5 MG TABS, Take 1 tablet by mouth daily., Disp: , Rfl: ;  clopidogrel (PLAVIX) 75 MG tablet, Take 1 tablet (75 mg total) by mouth daily., Disp: 90 tablet, Rfl: 3;  levothyroxine (SYNTHROID, LEVOTHROID) 50 MCG tablet, TAKE 1 TABLET (50 MCG TOTAL) BY MOUTH DAILY., Disp: 90 tablet, Rfl: 3;  valsartan-hydrochlorothiazide (DIOVAN-HCT) 320-25 MG per tablet, Take 1 tablet by mouth daily., Disp: 90 tablet, Rfl: 3 nitroGLYCERIN (NITROSTAT) 0.4 MG SL tablet, Place 1 tablet (  0.4 mg total) under the tongue every 5 (five) minutes as needed for chest pain., Disp: 25 tablet, Rfl: 0  No Known Allergies  ROS:  She denies fevers, numbness, tingling, weakness, bruising.  Denies nausea, vomiting, URI symptoms or other complaints.  See HPI;  PHYSICAL EXAM: BP 160/80  Pulse 68  Ht 5\' 2"  (1.575 m)  Wt 165 lb (74.844 kg)  BMI 30.17 kg/m2 Pleasant female, in no acute distress until ankle moved/examined.  She is in a wheelchair, nonweightbearing.  Left lower extremity: Tender at proximal fibula on left.  No overlying bruising or edema,  No malleolar tenderness, but tender at ligaments inferior to lateral malleolus (inferior portion of ATFL).  Negative drawer Mild swelling, no bruising. 2+ pulses  ASSESSMENT/PLAN:  Left ankle sprain, initial encounter - Plan: celecoxib (CELEBREX) 200 MG capsule  Left  ankle pain - Plan: DG Ankle Complete Left  Leg pain, left - Plan: DG Tibia/Fibula Left  X-rays negative for fracture. Wrapped with ACE bandage.  Ice, elevate, ROM exercises.  Crutches and weight-bearing as tolerated.    F/u prn persistent pain, inability to bear weight.  Given celebrex samples to help with pain control and swelling.

## 2013-12-25 ENCOUNTER — Ambulatory Visit (INDEPENDENT_AMBULATORY_CARE_PROVIDER_SITE_OTHER): Payer: Medicare Other | Admitting: Cardiovascular Disease

## 2013-12-25 ENCOUNTER — Encounter: Payer: Self-pay | Admitting: Cardiovascular Disease

## 2013-12-25 VITALS — BP 167/77 | HR 61 | Ht 63.0 in | Wt 171.7 lb

## 2013-12-25 DIAGNOSIS — I10 Essential (primary) hypertension: Secondary | ICD-10-CM

## 2013-12-25 DIAGNOSIS — E7800 Pure hypercholesterolemia, unspecified: Secondary | ICD-10-CM

## 2013-12-25 DIAGNOSIS — I2510 Atherosclerotic heart disease of native coronary artery without angina pectoris: Secondary | ICD-10-CM

## 2013-12-25 DIAGNOSIS — G4733 Obstructive sleep apnea (adult) (pediatric): Secondary | ICD-10-CM

## 2013-12-25 DIAGNOSIS — E039 Hypothyroidism, unspecified: Secondary | ICD-10-CM

## 2013-12-25 NOTE — Patient Instructions (Signed)
Your physician recommends that you schedule a follow-up appointment with Dr. [facility] in 2 months.

## 2014-01-12 ENCOUNTER — Encounter: Payer: Self-pay | Admitting: Cardiovascular Disease

## 2014-01-12 NOTE — Progress Notes (Signed)
Patient ID: Theresa Mccarty, female   DOB: 12-02-1941, 73 y.o.   MRN: [id number]     HPI: Theresa Mccarty is a 73 y.o. female history of hypertension, hyperlipidemia, coronary artery disease, and obstructive sleep apnea. She has had malfunction with her old CPAP unit and presents for sleep clinic evaluation.  Theresa Mccarty is followed by Dr. Debbe Mounts her primary cardiology care. She has known coronary disease and underwent prior stenting of her LAD. She has a history of hypertension as well as hyperlipidemia.  In 2009, she was having complaints of difficulty with sleep which led to her diagnostic polysomnogram. She was noted to have mild-moderate obstructive sleep apnea overall with an AHI of 14.4 per hour. However, during REM sleep her sleep apnea was severe with an AHI of 41.7. She has significant oxygen desaturation to 83% during REM sleep. She also had frequent periodic leg movements with an index of 29 per hour with 14.8 per hour leading to arousals. She has been on CPAP therapy since that time and initially was titrated up to a 12 cm water pressure on her CPAP unit. I did review a download from 2014 which encompasses dates from 4/24 2014 through 12/15/2013. Her percent days with device usage was 99.4%. Her AHI was 2.0. She averaged 7 hours and 49 minutes per use. Recently, she has been having difficulty with her machine intermittently not working. Her unit is over 33 1/73 years old. She has been using nasal mask. When she has consistently been using her CPAP therapy, she has not had residual hypersomnolence. She denies painful restless legs. She is unaware of bruxism. She denies hypnagogic hallucinations per  Epworth Sleepiness Scale: Situation   Chance of Dozing/Sleeping (0 = never , 1 = slight chance , 2 = moderate chance , 3 = high chance )   sitting and reading 1   watching TV 2   sitting inactive in a public place 0   being a passenger in a motor vehicle for an hour or more 0   lying down in the  afternoon 0   sitting and talking to someone 0   sitting quietly after lunch (no alcohol) 0   while stopped for a few minutes in traffic as the driver 0   Total Score  3    Past Medical History  Diagnosis Date  . Hypertension   . Osteoporosis   . Back pain, chronic   . Hypothyroid   . CAD (coronary artery disease)   . Impaired fasting glucose   . Sleep apnea     on CPAP  . Broken leg 1958  . Renal cell carcinoma right; 2011    Dr. Alinda Money, [facility]  . Hyperlipidemia   . History of echocardiogram 09/11/2007    normal  . History of nuclear stress test 11/17/2010    exercise; small fixed anteroseptal defect (?artifact); short run of atrial-tachy during recovery; low risk    Past Surgical History  Procedure Laterality Date  . Abdominal hysterectomy  1975    PARTIAL; part of 1 ovary remains  . Spine surgery    . Coronary angioplasty with stent placement  2008    mid LAD  . Kidney surgery  2011    renal carcinoma - cryoablation -  Dr. Alinda Money and Dr. Laural Roes (cryoablation)  . Cardiac catheterization  10/08/2011    LAD-prox stent patent; no significant obstructive CAD; hi LVF end-diastolic pressure    No Known Allergies  Current Outpatient Prescriptions  Medication  Sig Dispense Refill  . amLODipine (NORVASC) 5 MG tablet Take 1 tablet (5 mg total) by mouth daily.  90 tablet  3  . aspirin 81 MG tablet Take 81 mg by mouth daily.        [name] atorvastatin (LIPITOR) 20 MG tablet Take 1 tablet (20 mg total) by mouth daily.  90 tablet  3  . B Complex Vitamins (B-COMPLEX/B-12 PO) Take 1 tablet by mouth daily.        . Calcium-Magnesium-Zinc 500-250-12.5 MG TABS Take 1 tablet by mouth daily.      . celecoxib (CELEBREX) 200 MG capsule Take 1 capsule (200 mg total) by mouth daily.  12 capsule  0  . clopidogrel (PLAVIX) 75 MG tablet Take 1 tablet (75 mg total) by mouth daily.  90 tablet  3  . levothyroxine (SYNTHROID, LEVOTHROID) 50 MCG tablet TAKE 1 TABLET (50 MCG TOTAL) BY MOUTH  DAILY.  90 tablet  3  . nitroGLYCERIN (NITROSTAT) 0.4 MG SL tablet Place 1 tablet (0.4 mg total) under the tongue every 5 (five) minutes as needed for chest pain.  25 tablet  0  . valsartan-hydrochlorothiazide (DIOVAN-HCT) 320-25 MG per tablet Take 1 tablet by mouth daily.  90 tablet  3   No current facility-administered medications for this visit.    Social history is notable in that she is married and has 2 children. There is no tobacco use. She does not routinely exercise. He does drink alcohol rarely.  ROS negative for fever, chills or night sweats. She denies change in vision. She denies significant change in weight. There is no hearing difficulty. She denies dyspnea. She denies recurrent angina. She does have arthritis. She does have hypothyroidism. She does have a history of hypertension. She denies nausea vomiting or diarrhea. She denies blood in stool your and per denies bleeding. She denies rash. At times her some GERD mild intermittent leg swelling. She does have hyperlipidemia. There is no history of diabetes. Other comprehensive 14 point system review is negative.  PE BP 167/77  Pulse 61  Ht 5\' 3"  (1.6 m)  Wt 171 lb 11.2 oz (77.883 kg)  BMI 30.42 kg/m2  Repeat blood pressure taken by me is 148/80 General: Alert, oriented, no distress.  Skin: normal turgor, no rashes HEENT: Normocephalic, atraumatic. Pupils round and reactive; sclera anicteric; extraocular muscles intact; Fundi without hemorrhages or exudates. Nose without nasal septal hypertrophy Mouth/Parynx benign; Mallinpatti scale 2/3 Neck: No JVD, no carotid briuts Lungs: clear to ausculatation and percussion; no wheezing or rales  Chest wall: No tenderness to palpation Heart: RRR, s1 s2 normal  Abdomen: soft, nontender; no hepatosplenomehaly, BS+; abdominal aorta nontender and not dilated by palpation. Back: No CVA tenderness Pulses 2+ Extremities: no clubbinbg cyanosis or edema, Homan's sign negative  Neurologic:  grossly nonfocal; cranial nerves intact. Psychological: Normal affect and mood.    LABS:  BMET    Component Value Date/Time   NA 136 10/16/2013 1557   K 4.2 10/16/2013 1557   CL 99 10/16/2013 1557   CO2 30 10/16/2013 1557   GLUCOSE 99 10/16/2013 1557   BUN 14 10/16/2013 1557   CREATININE 0.64 10/16/2013 1557   CREATININE 0.79 10/11/2011 0345   CALCIUM 9.2 10/16/2013 1557   GFRNONAA 82* 10/11/2011 0345   GFRAA >90 10/11/2011 0345     Hepatic Function Panel     Component Value Date/Time   PROT 6.2 10/16/2013 1557   ALBUMIN 4.0 10/16/2013 1557   AST 16 10/16/2013 1557  ALT 18 10/16/2013 1557   ALKPHOS 60 10/16/2013 1557   BILITOT 0.5 10/16/2013 1557     CBC    Component Value Date/Time   WBC 3.4* 10/16/2013 1557   RBC 3.60* 10/16/2013 1557   HGB 11.9* 10/16/2013 1557   HCT 34.0* 10/16/2013 1557   PLT 231 10/16/2013 1557   MCV 94.4 10/16/2013 1557   MCH 33.1 10/16/2013 1557   MCHC 35.0 10/16/2013 1557   RDW 12.9 10/16/2013 1557   LYMPHSABS 0.8 10/16/2013 1557   MONOABS 0.4 10/16/2013 1557   EOSABS 0.1 10/16/2013 1557   BASOSABS 0.0 10/16/2013 1557     BNP    Component Value Date/Time   PROBNP 263.8* 10/09/2011 0333    Lipid Panel     Component Value Date/Time   CHOL 149 08/09/2012 1015   TRIG 46 07/09/2013 0805   TRIG 35 08/09/2012 1015   HDL 76 08/09/2012 1015   CHOLHDL 2.0 08/09/2012 1015   VLDL 7 08/09/2012 1015   LDLCALC 81 07/09/2013 0805   LDLCALC 66 08/09/2012 1015     RADIOLOGY: No results found.    ASSESSMENT AND PLAN: Theresa Mccarty has a history of documented obstructive sleep apnea since 2009. She has been using a nasal mask. Previous downloads have documented excellent benefit with CPAP therapy. Her CPAP unit is now over 93-1/73 years old and there is now become difficulty with it intermittently not working and not being reliable. Her blood pressure today was initially elevated at 167/77 and on repeat was 148/80. I have seen her in a  face-to-face evaluation today it is my recommendation that she obtain a new CPAP machine. Her previous set CPAP pressure of 12 cm seems to have been optimal for her. Once she gets her new machine, I will then see her in sleep clinic after a 30 day download has been obtained. She will monitor her blood pressure since this is mildly elevated today on her current dose of valsartan HCT 320/25, and amlodipine 5 mg. If her blood pressure continues to be elevated additional dose titration will be necessary. She's not having anginal symptoms. She's not having any apparent arrhythmia on current therapy. She was noted to have periodic lid movements on her initial diagnostic polysomnogram. Presently, she denies the urge to move her legs in the early phases of sleep or leg discomfort.     Troy Sine, MD, [facility]  01/12/2014 12:23 PM

## 2014-02-12 ENCOUNTER — Telehealth: Payer: Self-pay | Admitting: Family Medicine

## 2014-02-12 NOTE — Telephone Encounter (Signed)
Done

## 2014-02-12 NOTE — Telephone Encounter (Signed)
Ok to fax order for Zostavax

## 2014-03-06 ENCOUNTER — Encounter: Payer: Self-pay | Admitting: Cardiovascular Disease

## 2014-03-06 ENCOUNTER — Ambulatory Visit (INDEPENDENT_AMBULATORY_CARE_PROVIDER_SITE_OTHER): Payer: Medicare Other | Admitting: Cardiovascular Disease

## 2014-03-06 VITALS — BP 171/79 | HR 65 | Ht 63.0 in | Wt 171.1 lb

## 2014-03-06 DIAGNOSIS — I2510 Atherosclerotic heart disease of native coronary artery without angina pectoris: Secondary | ICD-10-CM

## 2014-03-06 DIAGNOSIS — I10 Essential (primary) hypertension: Secondary | ICD-10-CM

## 2014-03-06 DIAGNOSIS — E039 Hypothyroidism, unspecified: Secondary | ICD-10-CM

## 2014-03-06 DIAGNOSIS — G4733 Obstructive sleep apnea (adult) (pediatric): Secondary | ICD-10-CM

## 2014-03-06 NOTE — Patient Instructions (Signed)
Your physician recommends that you schedule a follow-up appointment in: AS NEEDED FOR SLEEP WITH DR. Claiborne Billings.

## 2014-03-27 ENCOUNTER — Other Ambulatory Visit (HOSPITAL_COMMUNITY): Payer: Self-pay | Admitting: Interventional Radiology

## 2014-03-27 DIAGNOSIS — C641 Malignant neoplasm of right kidney, except renal pelvis: Secondary | ICD-10-CM

## 2014-04-06 ENCOUNTER — Encounter: Payer: Self-pay | Admitting: Cardiovascular Disease

## 2014-04-06 NOTE — Progress Notes (Signed)
Patient ID: Theresa Mccarty, female   DOB: 06-07-1941, 73 y.o.   MRN: [id number]      HPI: Theresa Mccarty is a 73 y.o. female history of hypertension, hyperlipidemia, coronary artery disease, and obstructive sleep apnea. She has had malfunction with her old CPAP unit and presents for follow-up sleep clinic evaluation following a recent new replacement CPAP device.  Theresa Mccarty is followed by [name] for her primary cardiology care. She has known coronary disease and underwent prior stenting of her LAD. She has a history of hypertension as well as hyperlipidemia.  In 2009  , a diagnostic polysomnogram revealed mild-moderate obstructive sleep apnea overall with an AHI of 14.4 per hour. However, during REM sleep her sleep apnea was severe with an AHI of 41.7. She has significant oxygen desaturation to 83% during REM sleep. She also had frequent periodic leg movements with an index of 29 per hour with 14.8 per hour leading to arousals. She has been on CPAP therapy since that time and initially was titrated up to a 12 cm water pressure on her CPAP unit. I did review a download from 2014 which encompasses dates from 4/24 2014 through 12/15/2013. Her percent days with device usage was 99.4%. Her AHI was 2.0. She averaged 7 hours and 49 minutes per use. Recently, she had been having difficulty with her machine intermittently not working. Her unit is over 40 1/73 years old. She has been using nasal mask. When she has consistently been using her CPAP therapy, she has not had residual hypersomnolence. She denies painful restless legs. She is unaware of bruxism. She denies hypnagogic hallucinations.  She recently was at a new ResMed air since 10 AutoSet CPAP unit.  This has been set at a pressure of 12 cm.  A download from 02/17/2014 through 03/01/2014 has shown 100% compliance.  Days use greater than 4 hours was also 100%.  She has noticed marked benefit with this new machine.  Her AHI was 3.0 with an apnea index of  2.6, and hypotony index of 0.4.  An Epworth sleepiness scale was recalculated today and this endorsed at 0 arguing against any residual daytime sleepiness.  Epworth Sleepiness Scale: Situation   Chance of Dozing/Sleeping (0 = never , 1 = slight chance , 2 = moderate chance , 3 = high chance )   sitting and reading 0   watching TV 0   sitting inactive in a public place 0   being a passenger in a motor vehicle for an hour or more 0   lying down in the afternoon 0   sitting and talking to someone 0   sitting quietly after lunch (no alcohol) 0   while stopped for a few minutes in traffic as the driver 0   Total Score  0    Past Medical History  Diagnosis Date  . Hypertension   . Osteoporosis   . Back pain, chronic   . Hypothyroid   . CAD (coronary artery disease)   . Impaired fasting glucose   . Sleep apnea     on CPAP  . Broken leg 1958  . Renal cell carcinoma right; 2011    Dr. Alinda Money, [facility]  . Hyperlipidemia   . History of echocardiogram 09/11/2007    normal  . History of nuclear stress test 11/17/2010    exercise; small fixed anteroseptal defect (?artifact); short run of atrial-tachy during recovery; low risk    Past Surgical History  Procedure Laterality Date  .  Abdominal hysterectomy  1975    PARTIAL; part of 1 ovary remains  . Spine surgery    . Coronary angioplasty with stent placement  2008    mid LAD  . Kidney surgery  2011    renal carcinoma - cryoablation -  Dr. Alinda Money and Dr. Laural Roes (cryoablation)  . Cardiac catheterization  10/08/2011    LAD-prox stent patent; no significant obstructive CAD; hi LVF end-diastolic pressure    No Known Allergies  Current Outpatient Prescriptions  Medication Sig Dispense Refill  . amLODipine (NORVASC) 5 MG tablet Take 1 tablet (5 mg total) by mouth daily.  90 tablet  3  . aspirin 81 MG tablet Take 81 mg by mouth daily.        [name] atorvastatin (LIPITOR) 20 MG tablet Take 1 tablet (20 mg total) by mouth daily.  90  tablet  3  . B Complex Vitamins (B-COMPLEX/B-12 PO) Take 1 tablet by mouth daily.        . Calcium-Magnesium-Zinc 500-250-12.5 MG TABS Take 1 tablet by mouth daily.      . clopidogrel (PLAVIX) 75 MG tablet Take 1 tablet (75 mg total) by mouth daily.  90 tablet  3  . levothyroxine (SYNTHROID, LEVOTHROID) 50 MCG tablet TAKE 1 TABLET (50 MCG TOTAL) BY MOUTH DAILY.  90 tablet  3  . nitroGLYCERIN (NITROSTAT) 0.4 MG SL tablet Place 1 tablet (0.4 mg total) under the tongue every 5 (five) minutes as needed for chest pain.  25 tablet  0  . valsartan-hydrochlorothiazide (DIOVAN-HCT) 320-25 MG per tablet Take 1 tablet by mouth daily.  90 tablet  3   No current facility-administered medications for this visit.    Social history is notable in that she is married and has 2 children. There is no tobacco use. She does not routinely exercise. He does drink alcohol rarely.  ROS negative for fever, chills or night sweats. She denies change in vision. She denies significant change in weight. There is no hearing difficulty. She denies dyspnea. She denies recurrent angina. She does have arthritis. She does have hypothyroidism. She does have a history of hypertension. She denies nausea vomiting or diarrhea. She denies blood in stool your and per denies bleeding. She denies rash. At times her some GERD mild intermittent leg swelling.  She denies myalgias.  She denies claudication.  She does have hyperlipidemia. There is no history of diabetes.  There is no bruxism.  She denies restless legs.  She believes she is able to dream. Other comprehensive 14 point system review is negative.  PE BP 171/79  Pulse 65  Ht 5\' 3"  (1.6 m)  Wt 171 lb 1.6 oz (77.61 kg)  BMI 30.32 kg/m2  Repeat blood pressure when taken by me was markedly improved at 124/78. Repeat blood pressure taken by me is 148/80 General: Alert, oriented, no distress.  Skin: normal turgor, no rashes HEENT: Normocephalic, atraumatic. Pupils round and reactive;  sclera anicteric; extraocular muscles intact; Fundi without hemorrhages or exudates. Nose without nasal septal hypertrophy Mouth/Parynx benign; Mallinpatti scale 2/3 Neck: No JVD, no carotid briuts Lungs: clear to ausculatation and percussion; no wheezing or rales  Chest wall: No tenderness to palpation Heart: RRR, s1 s2 normal  Abdomen: soft, nontender; no hepatosplenomehaly, BS+; abdominal aorta nontender and not dilated by palpation. Back: No CVA tenderness Pulses 2+ Extremities: no clubbinbg cyanosis or edema, Homan's sign negative  Neurologic: grossly nonfocal; cranial nerves intact. Psychological: Normal affect and mood.    LABS:  BMET  Component Value Date/Time   NA 136 10/16/2013 1557   K 4.2 10/16/2013 1557   CL 99 10/16/2013 1557   CO2 30 10/16/2013 1557   GLUCOSE 99 10/16/2013 1557   BUN 14 10/16/2013 1557   CREATININE 0.64 10/16/2013 1557   CREATININE 0.79 10/11/2011 0345   CALCIUM 9.2 10/16/2013 1557   GFRNONAA 64 10/19/2012 0530   GFRNONAA 82* 10/11/2011 0345   GFRAA 73 10/19/2012 0530   GFRAA >90 10/11/2011 0345     Hepatic Function Panel     Component Value Date/Time   PROT 6.2 10/16/2013 1557   ALBUMIN 4.0 10/16/2013 1557   AST 16 10/16/2013 1557   ALT 18 10/16/2013 1557   ALKPHOS 60 10/16/2013 1557   BILITOT 0.5 10/16/2013 1557     CBC    Component Value Date/Time   WBC 3.4* 10/16/2013 1557   RBC 3.60* 10/16/2013 1557   HGB 11.9* 10/16/2013 1557   HCT 34.0* 10/16/2013 1557   PLT 231 10/16/2013 1557   MCV 94.4 10/16/2013 1557   MCH 33.1 10/16/2013 1557   MCHC 35.0 10/16/2013 1557   RDW 12.9 10/16/2013 1557   LYMPHSABS 0.8 10/16/2013 1557   MONOABS 0.4 10/16/2013 1557   EOSABS 0.1 10/16/2013 1557   BASOSABS 0.0 10/16/2013 1557     BNP    Component Value Date/Time   PROBNP 263.8* 10/09/2011 0333    Lipid Panel     Component Value Date/Time   CHOL 149 08/09/2012 1015   TRIG 46 07/09/2013 0805   TRIG 35 08/09/2012 1015   HDL 76  08/09/2012 1015   CHOLHDL 2.0 08/09/2012 1015   VLDL 7 08/09/2012 1015   LDLCALC 81 07/09/2013 0805   LDLCALC 66 08/09/2012 1015     RADIOLOGY: No results found.    ASSESSMENT AND PLAN: Theresa Mccarty has a history of documented obstructive sleep apnea since 2009. She has been using a nasal mask. Previous downloads have documented excellent benefit with CPAP therapy. Her previous CPAP unit was over 29-1/73 years old and was not functioning, consistent.  She now has been on a new ResMed air since 10 AutoSet, which is set at 12 cm water pressure.  She is 100% compliant with use.  There is no residual daytime sleepiness.  She feels well.  Her blood pressure continues to be somewhat labile, particularly on presentation.  However, when rechecked by me today was markedly improved and was normal.  She is on Diovan, HCT, 320/25, as well as amlodipine 5 mg.  She also takes to antiplatelet therapy with aspirin and Plavix.  She does have hypothyroidism on Synthroid replacement.  She will return back to the care of [name].  I will be available if issues occur from his perspective.  Troy Sine, MD, [facility]  04/06/2014 12:12 PM

## 2014-04-09 ENCOUNTER — Other Ambulatory Visit: Payer: Self-pay | Admitting: Radiology

## 2014-04-09 DIAGNOSIS — N2889 Other specified disorders of kidney and ureter: Secondary | ICD-10-CM

## 2014-04-17 ENCOUNTER — Encounter: Payer: Medicare Other | Admitting: Family Medicine

## 2014-04-28 LAB — BUN: BUN: 16 mg/dL (ref 6–23)

## 2014-04-28 LAB — CREATININE WITH EST GFR
CREATININE: 0.68 mg/dL (ref 0.50–1.10)
GFR, Est African American: >89 mL/min
GFR, Est Non African American: 88 mL/min

## 2014-04-29 ENCOUNTER — Ambulatory Visit (HOSPITAL_COMMUNITY)
Admission: RE | Admit: 2014-04-29 | Discharge: 2014-04-29 | Disposition: A | Discharge status: Home / Self Care | Payer: Medicare Other | Source: Ambulatory Visit | Attending: Interventional Radiology | Admitting: Interventional Radiology

## 2014-04-29 ENCOUNTER — Inpatient Hospital Stay: Admission: RE | Admit: 2014-04-29 | Payer: Medicare Other | Source: Ambulatory Visit

## 2014-04-29 DIAGNOSIS — K862 Cyst of pancreas: Secondary | ICD-10-CM | POA: Insufficient documentation

## 2014-04-29 DIAGNOSIS — C641 Malignant neoplasm of right kidney, except renal pelvis: Secondary | ICD-10-CM

## 2014-04-29 DIAGNOSIS — N281 Cyst of kidney, acquired: Secondary | ICD-10-CM | POA: Insufficient documentation

## 2014-04-29 DIAGNOSIS — C649 Malignant neoplasm of unspecified kidney, except renal pelvis: Secondary | ICD-10-CM | POA: Insufficient documentation

## 2014-04-29 DIAGNOSIS — K7689 Other specified diseases of liver: Secondary | ICD-10-CM | POA: Insufficient documentation

## 2014-04-29 DIAGNOSIS — K863 Pseudocyst of pancreas: Secondary | ICD-10-CM

## 2014-04-29 MED ORDER — GADOBENATE DIMEGLUMINE 529 MG/ML IV SOLN
20.0000 mL | Freq: Once | INTRAVENOUS | Status: AC | PRN
  Administered 2014-04-29: 20 mL via INTRAVENOUS

## 2014-05-01 ENCOUNTER — Ambulatory Visit
Admission: RE | Admit: 2014-05-01 | Discharge: 2014-05-01 | Disposition: A | Discharge status: Home / Self Care | Payer: Medicare Other | Source: Ambulatory Visit | Attending: Interventional Radiology | Admitting: Interventional Radiology

## 2014-05-01 DIAGNOSIS — C641 Malignant neoplasm of right kidney, except renal pelvis: Secondary | ICD-10-CM

## 2014-05-14 ENCOUNTER — Encounter: Payer: Self-pay | Admitting: Cardiovascular Disease

## 2014-06-12 ENCOUNTER — Ambulatory Visit (INDEPENDENT_AMBULATORY_CARE_PROVIDER_SITE_OTHER): Payer: Medicare Other | Admitting: Internal Medicine

## 2014-06-12 ENCOUNTER — Telehealth: Payer: Self-pay | Admitting: Internal Medicine

## 2014-06-12 ENCOUNTER — Encounter: Payer: Self-pay | Admitting: Internal Medicine

## 2014-06-12 VITALS — BP 128/64 | HR 63 | Ht 63.0 in | Wt 166.3 lb

## 2014-06-12 DIAGNOSIS — Z131 Encounter for screening for diabetes mellitus: Secondary | ICD-10-CM

## 2014-06-12 DIAGNOSIS — I2510 Atherosclerotic heart disease of native coronary artery without angina pectoris: Secondary | ICD-10-CM

## 2014-06-12 DIAGNOSIS — Z9989 Dependence on other enabling machines and devices: Secondary | ICD-10-CM

## 2014-06-12 DIAGNOSIS — I2583 Coronary atherosclerosis due to lipid rich plaque: Secondary | ICD-10-CM

## 2014-06-12 DIAGNOSIS — G4733 Obstructive sleep apnea (adult) (pediatric): Secondary | ICD-10-CM

## 2014-06-12 DIAGNOSIS — E7800 Pure hypercholesterolemia, unspecified: Secondary | ICD-10-CM

## 2014-06-12 DIAGNOSIS — E785 Hyperlipidemia, unspecified: Secondary | ICD-10-CM

## 2014-06-12 DIAGNOSIS — I10 Essential (primary) hypertension: Secondary | ICD-10-CM

## 2014-06-12 LAB — LIPID PANEL
CHOLESTEROL: 160 mg/dL (ref 0–200)
HDL: 87 mg/dL (ref >39)
LDL Cholesterol: 64 mg/dL (ref 0–99)
Total CHOL/HDL Ratio: 1.8 Ratio
Triglycerides: 45 mg/dL (ref <150)
VLDL: 9 mg/dL (ref 0–40)

## 2014-06-12 LAB — HEMOGLOBIN A1C
Hgb A1c MFr Bld: 5.2 % (ref <5.7)
MEAN PLASMA GLUCOSE: 103 mg/dL (ref <117)

## 2014-06-12 NOTE — Telephone Encounter (Signed)
HgA1C not covered with entered diagnosis codes.  Will change to V58.69 - this code will cover.

## 2014-06-12 NOTE — Patient Instructions (Signed)
Your physician wants you to follow-up in: 1 year. You will receive a reminder letter in the mail two months in advance. If you don't receive a letter, please call our office to schedule the follow-up appointment.  Decrease Atorvastatin to 10mg  once daily.   Your physician recommends that you return for lab work at your convenience. You will need to be fasting.

## 2014-06-12 NOTE — Progress Notes (Signed)
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 Recently, she developed substernal chest pain in the fall and underwent cardiac catheterization. This demonstrated patent LAD stent that was proximal as well as 30% disease of the LAD just prior to that stent, but no other areas of obstructive coronary disease. She continued to have chest pain; however, I felt it was most likely related to stress. I have also encouraged dietary changes as well as trying to reduce her stress and alcohol intake. She has accomplished that to some extent and, over the past 6 months has had no other symptoms of chest discomfort. She occasionally gets short of breath with heavy exertion and has some swelling in her legs only when standing up for long periods of time. She continues to work at [employer] although there is some stress she is actually maintaining her exercise regimen. This is clearly demonstrated improvement in her risk factors including a reduction in cholesterol. A recent lipid NMR demonstrated a particle number of 631 and an LDL cholesterol of 81.   Theresa Mccarty returns for annual followup today. She reports feeling very well. She recently had the shingles vaccine at [facility]. She had some questions today about starting coconut oil glucosamine/chondroitin which I think are fine. She did have one episode of discomfort in her chest after working in regard for which she took nitroglycerin however she noted no improvement in her symptoms. She is very interested in decreasing her Lipitor she does report some soreness in her arm muscles as well as her leg muscles. She believes this is related to the statin. She has not been on any other statin since her stent was placed in 2008. She is interested a recheck of her  cholesterol today as well as a screening hemoglobin A1c - she is nondiabetic.  Finally, she recently had a fall at home which was due to tripping over a table leg. She did have some soreness in her right hand as well as ecchymosis and swelling of her digits and 2 bandages on her right toes.  PMHx:  Past Medical History  Diagnosis Date  . Hypertension   . Osteoporosis   . Back pain, chronic   . Hypothyroid   . CAD (coronary artery disease)   . Impaired fasting glucose   . Sleep apnea     on CPAP  . Broken leg 1958  . Renal cell carcinoma right; 2011    Dr. Alinda Money, [facility]  . Hyperlipidemia   . History of echocardiogram 09/11/2007    normal  . History of nuclear stress test 11/17/2010    exercise; small fixed anteroseptal defect (?artifact); short run of atrial-tachy during recovery; low risk    Past Surgical History  Procedure Laterality Date  . Abdominal hysterectomy  1975    PARTIAL; part of 1 ovary remains  . Spine surgery    . Coronary angioplasty with stent placement  2008    mid LAD  . Kidney surgery  2011    renal carcinoma - cryoablation -  Dr. Alinda Money and Dr. Laural Roes (cryoablation)  . Cardiac catheterization  10/08/2011    LAD-prox stent patent; no significant obstructive CAD; hi LVF end-diastolic pressure    FAMHx:  Family History  Problem Relation Age of Onset  . Heart  disease Mother   . Hypertension Mother   . Arthritis Mother   . Osteoporosis Mother   . Depression Sister   . Hyperlipidemia Sister   . Heart disease Father   . Diabetes Brother   . Cancer Sister     unsure, related to smoking?  . Cancer Sister     lung cancer  . Hyperlipidemia Brother   . Hypertension Brother     SOCHx:   reports that she quit smoking about 45 years ago. She has never used smokeless tobacco. She reports that she drinks about 1.2 ounces of alcohol per week. She reports that she does not use illicit drugs.  ALLERGIES:  No Known Allergies  ROS: A  comprehensive review of systems was negative.  HOME MEDS: Current Outpatient Prescriptions  Medication Sig Dispense Refill  . amLODipine (NORVASC) 5 MG tablet Take 1 tablet (5 mg total) by mouth daily.  90 tablet  3  . aspirin 81 MG tablet Take 81 mg by mouth daily.        [name] atorvastatin (LIPITOR) 20 MG tablet Take 10 mg by mouth daily.      . B Complex Vitamins (B-COMPLEX/B-12 PO) Take 1 tablet by mouth daily.        . Calcium-Magnesium-Zinc 500-250-12.5 MG TABS Take 1 tablet by mouth daily.      . clopidogrel (PLAVIX) 75 MG tablet Take 1 tablet (75 mg total) by mouth daily.  90 tablet  3  . levothyroxine (SYNTHROID, LEVOTHROID) 50 MCG tablet TAKE 1 TABLET (50 MCG TOTAL) BY MOUTH DAILY.  90 tablet  3  . nitroGLYCERIN (NITROSTAT) 0.4 MG SL tablet Place 1 tablet (0.4 mg total) under the tongue every 5 (five) minutes as needed for chest pain.  25 tablet  0  . valsartan-hydrochlorothiazide (DIOVAN-HCT) 320-25 MG per tablet Take 1 tablet by mouth daily.  90 tablet  3   No current facility-administered medications for this visit.    LABS/IMAGING: No results found for this or any previous visit (from the past 48 hour(s)). No results found.  VITALS: BP 128/64  Pulse 63  Ht 5\' 3"  (1.6 m)  Wt 166 lb 4.8 oz (75.433 kg)  BMI 29.47 kg/m2  EXAM: General appearance: alert and no distress Neck: no adenopathy, no carotid bruit, no JVD, supple, symmetrical, trachea midline and thyroid not enlarged, symmetric, no tenderness/mass/nodules Lungs: clear to auscultation bilaterally Heart: regular rate and rhythm, S1, S2 normal, no murmur, click, rub or gallop Abdomen: soft, non-tender; bowel sounds normal; no masses,  no organomegaly Extremities: Right hand ecchymosis, digit swelling, right toes are bandaged Pulses: 2+ and symmetric Skin: Skin color, texture, turgor normal. No rashes or lesions Neurologic: Grossly normal  EKG: Normal sinus rhythm at 63, non-specific T wave  changes  ASSESSMENT: 1. Coronary artery disease status post PCI of the proximal LAD in 2008 2. Hypertension - controlled 3. Hyperlipidemia - controlled 4. Obstructive sleep apnea on CPAP  PLAN: 1.   Theresa Mccarty is doing excellent. She does need to get back to exercise after she heals from her recent fall. It does not seem that she has suffered any fractures. Her blood pressure is well-controlled. Her cholesterol has been excellent and I feel that we could decrease her Lipitor even further as she is having symptoms that may be related to it. I recommend decreasing it to 10 mg daily. I will plan to recheck a lipid and MR as well as a hemoglobin A1c. She should continue to use CPAP  for sleep apnea which is working well for her. She hasn't recently got hearing aids and is much improved from that standpoint. Overall she's doing well and we'll see her back in one year.  [name], MD, [facility] Attending Cardiologist [facility] [place] [facility] 06/12/2014, 8:51 AM

## 2014-06-12 NOTE — Telephone Encounter (Signed)
Patient is at lab and they need a supportive diagnosis code .[name]

## 2014-06-29 ENCOUNTER — Other Ambulatory Visit: Payer: Self-pay | Admitting: Internal Medicine

## 2014-06-30 NOTE — Telephone Encounter (Signed)
Rx was sent to pharmacy electronically. 

## 2014-07-02 ENCOUNTER — Other Ambulatory Visit: Payer: Self-pay | Admitting: Internal Medicine

## 2014-07-06 ENCOUNTER — Other Ambulatory Visit: Payer: Self-pay | Admitting: Internal Medicine

## 2014-07-07 NOTE — Telephone Encounter (Signed)
Rx refill sent to patient pharmacy   

## 2014-08-06 ENCOUNTER — Encounter: Payer: Self-pay | Admitting: Family Medicine

## 2014-08-06 ENCOUNTER — Ambulatory Visit (INDEPENDENT_AMBULATORY_CARE_PROVIDER_SITE_OTHER): Payer: Medicare Other | Admitting: Family Medicine

## 2014-08-06 ENCOUNTER — Other Ambulatory Visit (HOSPITAL_COMMUNITY): Payer: Self-pay | Admitting: Urology

## 2014-08-06 ENCOUNTER — Ambulatory Visit (HOSPITAL_COMMUNITY)
Admission: RE | Admit: 2014-08-06 | Discharge: 2014-08-06 | Disposition: A | Discharge status: Home / Self Care | Payer: Medicare Other | Source: Ambulatory Visit | Attending: Urology | Admitting: Urology

## 2014-08-06 VITALS — BP 160/80 | HR 60 | Ht 62.0 in | Wt 164.0 lb

## 2014-08-06 DIAGNOSIS — I10 Essential (primary) hypertension: Secondary | ICD-10-CM

## 2014-08-06 DIAGNOSIS — E7800 Pure hypercholesterolemia, unspecified: Secondary | ICD-10-CM

## 2014-08-06 DIAGNOSIS — D72819 Decreased white blood cell count, unspecified: Secondary | ICD-10-CM

## 2014-08-06 DIAGNOSIS — G4733 Obstructive sleep apnea (adult) (pediatric): Secondary | ICD-10-CM

## 2014-08-06 DIAGNOSIS — E039 Hypothyroidism, unspecified: Secondary | ICD-10-CM

## 2014-08-06 DIAGNOSIS — Z9989 Dependence on other enabling machines and devices: Secondary | ICD-10-CM

## 2014-08-06 DIAGNOSIS — C649 Malignant neoplasm of unspecified kidney, except renal pelvis: Secondary | ICD-10-CM | POA: Insufficient documentation

## 2014-08-06 DIAGNOSIS — M810 Age-related osteoporosis without current pathological fracture: Secondary | ICD-10-CM

## 2014-08-06 DIAGNOSIS — Z0000 Encounter for general adult medical examination without abnormal findings: Secondary | ICD-10-CM

## 2014-08-06 DIAGNOSIS — Z23 Encounter for immunization: Secondary | ICD-10-CM

## 2014-08-06 LAB — POCT URINALYSIS DIPSTICK
Bilirubin, UA: NEGATIVE
Blood, UA: NEGATIVE
Glucose, UA: NEGATIVE
KETONES UA: NEGATIVE
LEUKOCYTES UA: NEGATIVE
NITRITE UA: NEGATIVE
PH UA: 7
PROTEIN UA: NEGATIVE
Spec Grav, UA: 1.01
UROBILINOGEN UA: NEGATIVE

## 2014-08-06 NOTE — Patient Instructions (Addendum)
  HEALTH MAINTENANCE RECOMMENDATIONS:  It is recommended that you get at least 30 minutes of aerobic exercise at least 5 days/week (for weight loss, you may need as much as 60-90 minutes). This can be any activity that gets your heart rate up. This can be divided in 10-15 minute intervals if needed, but try and build up your endurance at least once a week.  Weight bearing exercise is also recommended twice weekly.  Eat a healthy diet with lots of vegetables, fruits and fiber.  "Colorful" foods have a lot of vitamins (ie green vegetables, tomatoes, red peppers, etc).  Limit sweet tea, regular sodas and alcoholic beverages, all of which has a lot of calories and sugar.  Up to 1 alcoholic drink daily may be beneficial for women (unless trying to lose weight, watch sugars).  Drink a lot of water.  Calcium recommendations are 1200-1500 mg daily (1500 mg for postmenopausal women or women without ovaries), and vitamin D 1000 IU daily.  This should be obtained from diet and/or supplements (vitamins), and calcium should not be taken all at once, but in divided doses.  Monthly self breast exams and yearly mammograms for women over the age of 29 is recommended.  Sunscreen of at least SPF 30 should be used on all sun-exposed parts of the skin when outside between the hours of 10 am and 4 pm (not just when at beach or pool, but even with exercise, golf, tennis, and yard work!)  Use a sunscreen that says "broad spectrum" so it covers both UVA and UVB rays, and make sure to reapply every 1-2 hours.  Remember to change the batteries in your smoke detectors when changing your clock times in the spring and fall.  Use your seat belt every time you are in a car, and please drive safely and not be distracted with cell phones and texting while driving.   You are due to have another bone density test in November 2015.  If [facility] does not contact you to schedule it, let us know (or call them directly and they will contact  us for the order).  Cut back on alcohol Schedule mammogram (past due) Please schedule routine eye exam.

## 2014-08-06 NOTE — Progress Notes (Signed)
Chief Complaint  Patient presents with  . Annual Exam    fasting annual exam with pelvic. Only complaint is that she still has some vaginal itching.    Theresa Mccarty is a 73 y.o. female who presents for a complete physical.  She has the following concerns:  CAD and hyperlipidemia:  She saw [name] 2 months ago for annual visit.  Her lipitor dose was decreased to 66m, as she was having some side effects (muscle pains and achiness).  Symptoms have improved since decreasing the dose.  Following a lowfat, low cholesterol diet. She doesn't have f/u labs scheduled. She denies headaches, chest pain, shortness of breath.  Hypothyroidism:  Compliant with taking thyroid medications on an empty stomach, separate from other medications.  She feels well, denies any thyroid-related symptoms.  Slightly more tired than normal, but she thinks she might have some leaks in her CPAP mask  Hypertension follow-up:  Blood pressures elsewhere are 120's to high 130's/low 80's.  Denies dizziness, headaches, chest pain.  Denies side effects of medications.  OSA: compliant with using CPAP nightly. She isn't getting a "happy face"--there is a leak somewhere in the mask, and she will be contacting the company to try and correct.   Osteoporosis: Previously treated with fosamax and Boniva. She has been off meds for at least 5-6 years. She doesn't recall why the medication was stopped, denies side effects.  Her DEXA in 2009 was while still taking Boniva, and she had another DEXA 10/2012 which showed T-2.4 and decline in bone density at the left hip. Her Vitamin D level was too high on October.  She cut back her dose to 1000 IU daily.  Other doctors: Dr. BAlinda Money(urology) Dr. [name](cardiology) Dr[facility] KClaiborne Billingsfor [facility]  Immunization History  Administered Date(s) Administered  . Influenza, High Dose Seasonal PF 10/16/2013  . Pneumococcal Polysaccharide-23 08/09/2012  . Td 01/04/2006  . Tdap  08/09/2012  . Zoster 02/16/2014   Last Pap smear: 2009; S/p hysterectomy in '75  Last mammogram: 12/2011  Last colonoscopy: 07/26/11  [facility]  T-2.4 at L femur, with significant decline at L hip since 2009 DEXA Dentist:  past due  Ophtho: many years ago  Exercise: treadmill 30-45 minutes 6-7x/week, 2.5 mph.  She lifts water buckets frequently, and some bricks in the garden.  No other regular weight-bearing exercise.  Past Medical History  Diagnosis Date  . Hypertension   . Osteoporosis   . Back pain, chronic   . Hypothyroid   . CAD (coronary artery disease)   . Impaired fasting glucose   . Sleep apnea     on CPAP  . Broken leg 1958  . Renal cell carcinoma right; 2011    Dr. BAlinda Money [facility]  . Hyperlipidemia   . History of echocardiogram 09/11/2007    normal  . History of nuclear stress test 11/17/2010    exercise; small fixed anteroseptal defect (?artifact); short run of atrial-tachy during recovery; low risk    Past Surgical History  Procedure Laterality Date  . Abdominal hysterectomy  1975    PARTIAL; part of 1 ovary remains  . Spine surgery    . Coronary angioplasty with stent placement  2008    mid LAD  . Kidney surgery  2011    renal carcinoma - cryoablation -  Dr. BAlinda Moneyand Dr. YLaural Roes(cryoablation)  . Cardiac catheterization  10/08/2011    LAD-prox stent patent; no significant obstructive  CAD; hi LVF end-diastolic pressure    History   Social History  . Marital Status: Married    Spouse Name: N/A    Number of Children: 2  . Years of Education: N/A   Occupational History  . 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 Smoking status: Former Smoker    Quit date: 12/19/1968  . Smokeless tobacco: Never Used  . Alcohol Use: 1.2 oz/week    2 Glasses of wine per week     Comment: 2 glasses of wine per day.  . Drug Use: No  . Sexual Activity: Not Currently   Other Topics Concern  . Not on file   Social  History Narrative   Lives with husband.  2 sons live together, near [place] History  Problem Relation Age of Onset  . Heart disease Mother   . Hypertension Mother   . Arthritis Mother   . Osteoporosis Mother   . Depression Sister   . Hyperlipidemia Sister   . Heart disease Father   . Diabetes Brother   . Cancer Sister     unsure, related to smoking?  . Cancer Sister     lung cancer  . Hyperlipidemia Brother   . Hypertension Brother    Outpatient Encounter Prescriptions as of 08/06/2014  Medication Sig  . amLODipine (NORVASC) 5 MG tablet TAKE 1 TABLET BY MOUTH EVERY DAY  . aspirin 81 MG tablet Take 81 mg by mouth daily.    [name] atorvastatin (LIPITOR) 20 MG tablet Take 0.5 tablets (10 mg total) by mouth daily.  . B Complex Vitamins (B-COMPLEX/B-12 PO) Take 1 tablet by mouth daily.    . Calcium-Magnesium-Zinc 500-250-12.5 MG TABS Take 1 tablet by mouth daily.  . Cholecalciferol (VITAMIN D) 1000 UNITS capsule Take 1,000 Units by mouth daily.  . clopidogrel (PLAVIX) 75 MG tablet TAKE 1 TABLET (75 MG TOTAL) BY MOUTH DAILY.  [name] levothyroxine (SYNTHROID, LEVOTHROID) 50 MCG tablet TAKE 1 TABLET (50 MCG TOTAL) BY MOUTH DAILY.  . valsartan-hydrochlorothiazide (DIOVAN-HCT) 320-25 MG per tablet TAKE 1 TABLET BY MOUTH DAILY.  . nitroGLYCERIN (NITROSTAT) 0.4 MG SL tablet Place 1 tablet (0.4 mg total) under the tongue every 5 (five) minutes as needed for chest pain.  . [DISCONTINUED] atorvastatin (LIPITOR) 20 MG tablet Take 10 mg by mouth daily.    No Known Allergies  The patient denies anorexia, fever, headaches, vision changes, ear pain, sore throat, breast concerns, chest pain, palpitations, dizziness, syncope, dyspnea on exertion, cough, swelling, nausea, vomiting, diarrhea, constipation, abdominal pain, melena, hematochezia, indigestion/heartburn, hematuria, incontinence, dysuria, vaginal bleeding, discharge, odor or itch, genital lesions, joint pains, numbness, tingling, weakness,  tremor, suspicious skin lesions, depression, anxiety, abnormal bleeding/bruising, or enlarged lymph nodes.  Has hearing loss --she got hearing aids. Pain in wrist, knees, ankles --mild/tolerable, stable/unchanged Slight vaginal itch, externally; denies discharge.  Only bothersome at night.  She wears cotton underwear and uses baby powder.  PHYSICAL EXAM:  BP 160/80  Pulse 60  Ht '5\' 2"'  (1.575 m)  Wt 164 lb (74.39 kg)  BMI 29.99 kg/m2 Same on repeat by MD General Appearance:  Alert, cooperative, no distress, appears stated age   Head:  Normocephalic, without obvious abnormality, atraumatic   Eyes:  PERRL, conjunctiva/corneas clear, EOM's intact, fundi  Benign.  Mild ptosis of upper lid on the right  Ears:  Normal TM's and external ear canals   Nose:  Nares normal, mucosa normal, no drainage or sinus  tenderness   Throat:  Lips, mucosa, and tongue normal; teeth and gums normal   Neck:  Supple, no lymphadenopathy; thyroid: no enlargement/tenderness/nodules; no carotid  bruit or JVD   Back:  Spine nontender, no curvature, ROM normal, no CVA tenderness   Lungs:  Clear to auscultation bilaterally without wheezes, rales or ronchi; respirations unlabored   Chest Wall:  No tenderness or deformity   Heart:  Regular rate and rhythm, S1 and S2 normal, no murmur, rub  or gallop   Breast Exam:  No tenderness, masses, or nipple discharge or inversion. No axillary lymphadenopathy   Abdomen:  Soft, non-tender, nondistended, normoactive bowel sounds,  no masses, no hepatosplenomegaly   Genitalia:  Normal external genitalia without lesions, some atrophic changes noted. Very slight erythema at labia/introitus (slightly pink). BUS and vagina normal. No abnormal vaginal discharge. Uterus surgically absent and adnexa not palpable--nontender, no masses. Pap not performed   Rectal:  Normal tone, no masses or tenderness; guaiac negative stool   Extremities:  No clubbing, cyanosis or edema   Pulses:  2+ and  symmetric all extremities   Skin:  Skin color, texture, turgor normal, no rashes. Actinic changes to skin throughout  Lymph nodes:  Cervical, supraclavicular, and axillary nodes normal   Neurologic:  CNII-XII intact, normal strength, sensation and gait; reflexes 2+ and symmetric throughout   Psych: Normal mood, affect, hygiene and grooming.   Lab Results  Component Value Date   CHOL 160 06/12/2014   HDL 87 06/12/2014   LDLCALC 64 06/12/2014   TRIG 45 06/12/2014   CHOLHDL 1.8 06/12/2014   Lab Results  Component Value Date   HGBA1C 5.2 06/12/2014   ASSESSMENT/PLAN:  Routine general medical examination at a health care facility - Plan: POCT Urinalysis Dipstick, Visual acuity screening, Lipid panel, Comprehensive metabolic panel, CBC with Differential, TSH  Unspecified hypothyroidism - euthyroid by history; takes meds properly - Plan: TSH  Essential hypertension, benign - elevated/borderline.  low sodium diet, weight loss, get CPAP mask corrected. If persistently elevated, will need med adjustment - Plan: Comprehensive metabolic panel, CBC with Differential  Pure hypercholesterolemia - lipitor dose decreased to 34m by Dr. [name]2 months ago.  Recheck today to ensure that she remains at goal. CAD is stable. - Plan: Lipid panel  OSA on CPAP - pt will contact DME company to address mask leak  Osteoporosis - s/p treatment with bisphosphonate, and decline in bone density since stopping it.  T-2.4.  Due for repeat DEXA 10/2014. Cont Ca, D, exercise  Need for prophylactic vaccination against Streptococcus pneumoniae (pneumococcus) - Plan: Pneumococcal conjugate vaccine 13-valent  Leukopenia - Plan: CBC with Differential  Flu shots yearly (high dose)--not available today, schedule NV. Prevnar-13 today  Elevated BP today--?related to white coat (lower at home, but still borderline).  ?related to CPAP not working properly.  Encouraged weight loss, low sodium diet, and cutting back on wine intake.  Reviewed BP goals.  Discussed monthly self breast exams and yearly mammograms; at least 30 minutes of aerobic activity at least 5 days/week, weight-bearing exercise 2x/week; proper sunscreen use reviewed; healthy diet, including goals of calcium and vitamin D intake and alcohol recommendations (less than or equal to 1 drink/day) reviewed; regular seatbelt use; changing batteries in smoke detectors. Immunization recommendations discussed as above. Colonoscopy recommendations reviewed, UTD  F/u with dermatologist  Cut back on alcohol Schedule mammogram (past due) Schedule eye exam--reviewed reasons why  c-met, CBC, TSH, lipids (low WBC, mild anemia in October) Will not recheck  Vit D--dose significantly lower now.  Send test results to [name] as Juluis Rainier, especially if not at goal for lipids.

## 2014-08-07 LAB — LIPID PANEL
CHOL/HDL RATIO: 1.9 ratio
Cholesterol: 159 mg/dL (ref 0–200)
HDL: 85 mg/dL (ref >39)
LDL Cholesterol: 63 mg/dL (ref 0–99)
Triglycerides: 56 mg/dL (ref <150)
VLDL: 11 mg/dL (ref 0–40)

## 2014-08-07 LAB — CBC WITH DIFFERENTIAL/PLATELET
Basophils Absolute: 0 K/uL (ref 0.0–0.1)
Basophils Relative: 1 % (ref 0–1)
Eosinophils Absolute: 0.1 K/uL (ref 0.0–0.7)
Eosinophils Relative: 3 % (ref 0–5)
HCT: 37.5 % (ref 36.0–46.0)
Hemoglobin: 12.7 g/dL (ref 12.0–15.0)
Lymphocytes Relative: 28 % (ref 12–46)
Lymphs Abs: 1.2 K/uL (ref 0.7–4.0)
MCH: 32.7 pg (ref 26.0–34.0)
MCHC: 33.9 g/dL (ref 30.0–36.0)
MCV: 96.6 fL (ref 78.0–100.0)
Monocytes Absolute: 0.5 K/uL (ref 0.1–1.0)
Monocytes Relative: 11 % (ref 3–12)
Neutro Abs: 2.4 K/uL (ref 1.7–7.7)
Neutrophils Relative %: 57 % (ref 43–77)
Platelets: 242 K/uL (ref 150–400)
RBC: 3.88 MIL/uL (ref 3.87–5.11)
RDW: 12.9 % (ref 11.5–15.5)
WBC: 4.2 K/uL (ref 4.0–10.5)

## 2014-08-07 LAB — COMPREHENSIVE METABOLIC PANEL
ALBUMIN: 4.5 g/dL (ref 3.5–5.2)
ALK PHOS: 63 U/L (ref 39–117)
ALT: 22 U/L (ref 0–35)
AST: 19 U/L (ref 0–37)
BUN: 15 mg/dL (ref 6–23)
CALCIUM: 9.2 mg/dL (ref 8.4–10.5)
CHLORIDE: 95 meq/L — AB (ref 96–112)
CO2: 30 mEq/L (ref 19–32)
Creat: 0.56 mg/dL (ref 0.50–1.10)
Glucose, Bld: 86 mg/dL (ref 70–99)
Potassium: 3.9 mEq/L (ref 3.5–5.3)
SODIUM: 132 meq/L — AB (ref 135–145)
Total Bilirubin: 0.7 mg/dL (ref 0.2–1.2)
Total Protein: 6.8 g/dL (ref 6.0–8.3)

## 2014-08-07 LAB — TSH: TSH: 1.454 u[IU]/mL (ref 0.350–4.500)

## 2014-10-20 ENCOUNTER — Encounter: Payer: Self-pay | Admitting: Family Medicine

## 2014-10-24 ENCOUNTER — Other Ambulatory Visit: Payer: Self-pay | Admitting: Family Medicine

## 2014-12-01 ENCOUNTER — Other Ambulatory Visit (INDEPENDENT_AMBULATORY_CARE_PROVIDER_SITE_OTHER): Payer: Medicare Other

## 2014-12-01 DIAGNOSIS — Z23 Encounter for immunization: Secondary | ICD-10-CM

## 2014-12-02 ENCOUNTER — Other Ambulatory Visit: Payer: Self-pay

## 2014-12-20 DIAGNOSIS — G4733 Obstructive sleep apnea (adult) (pediatric): Secondary | ICD-10-CM | POA: Diagnosis not present

## 2015-03-19 ENCOUNTER — Telehealth: Payer: Self-pay | Admitting: Internal Medicine

## 2015-03-19 NOTE — Telephone Encounter (Signed)
Please call on her Cell phone .. [phone]

## 2015-03-19 NOTE — Telephone Encounter (Signed)
Mrs. Theresa Mccarty that her eyes was were burning and felt like little pins were in her eyes. But she took a Nitro and feels better , but wanted to know is it ok to wait to come in to see [name] . Please call   Thanks

## 2015-03-19 NOTE — Telephone Encounter (Signed)
Don't think her eye pain is related to the heart - not sure why nitroglycerin helped her, unless her blood pressure was high and that lowered it.  She may need to see an allergist or ophthalmologist.  Dr. Lemmie Evens

## 2015-03-19 NOTE — Telephone Encounter (Signed)
Returned call to patient Dr.Hilty advised eye pain is not related to the heart,not sure why NTG helped unless B/P was high then lowered it.Advised may need to see a allergist or ophthalmologist.

## 2015-03-19 NOTE — Telephone Encounter (Signed)
Returned call to patient she stated for the past week she has had redness and burning in both eyes.Stated felt like pins sticking in eyes.Stated she took a NTG and her eyes feel better. Stated she wanted to make sure heart was not causing eyes to burn.Advised sounds like allergies and not her heart.Stated she does work in a [employer].Advised she can take a antihistamine with no decongestant.Advised to call PCP if not better.Message sent to Dr.Hilty for review.

## 2015-06-01 ENCOUNTER — Ambulatory Visit (INDEPENDENT_AMBULATORY_CARE_PROVIDER_SITE_OTHER): Payer: Medicare Other | Admitting: Internal Medicine

## 2015-06-01 VITALS — BP 130/80 | HR 65 | Ht 63.0 in | Wt 165.0 lb

## 2015-06-01 DIAGNOSIS — Z79899 Other long term (current) drug therapy: Secondary | ICD-10-CM | POA: Diagnosis not present

## 2015-06-01 DIAGNOSIS — I10 Essential (primary) hypertension: Secondary | ICD-10-CM

## 2015-06-01 DIAGNOSIS — Z9989 Dependence on other enabling machines and devices: Secondary | ICD-10-CM

## 2015-06-01 DIAGNOSIS — Z136 Encounter for screening for cardiovascular disorders: Secondary | ICD-10-CM | POA: Diagnosis not present

## 2015-06-01 DIAGNOSIS — G4733 Obstructive sleep apnea (adult) (pediatric): Secondary | ICD-10-CM

## 2015-06-01 DIAGNOSIS — E785 Hyperlipidemia, unspecified: Secondary | ICD-10-CM | POA: Diagnosis not present

## 2015-06-01 DIAGNOSIS — I2510 Atherosclerotic heart disease of native coronary artery without angina pectoris: Secondary | ICD-10-CM

## 2015-06-01 DIAGNOSIS — E7800 Pure hypercholesterolemia, unspecified: Secondary | ICD-10-CM

## 2015-06-01 LAB — LIPID PANEL
CHOL/HDL RATIO: 1.8 ratio
Cholesterol: 170 mg/dL (ref 0–200)
HDL: 97 mg/dL (ref >=46)
LDL Cholesterol: 63 mg/dL (ref 0–99)
Triglycerides: 48 mg/dL (ref <150)
VLDL: 10 mg/dL (ref 0–40)

## 2015-06-01 LAB — COMPREHENSIVE METABOLIC PANEL WITH GFR
ALT: 18 U/L (ref 0–35)
AST: 18 U/L (ref 0–37)
Albumin: 4.3 g/dL (ref 3.5–5.2)
Alkaline Phosphatase: 53 U/L (ref 39–117)
BUN: 20 mg/dL (ref 6–23)
CO2: 26 meq/L (ref 19–32)
Calcium: 9.3 mg/dL (ref 8.4–10.5)
Chloride: 97 meq/L (ref 96–112)
Creat: 0.62 mg/dL (ref 0.50–1.10)
Glucose, Bld: 104 mg/dL — ABNORMAL HIGH (ref 70–99)
Potassium: 4 meq/L (ref 3.5–5.3)
Sodium: 133 meq/L — ABNORMAL LOW (ref 135–145)
Total Bilirubin: 0.6 mg/dL (ref 0.2–1.2)
Total Protein: 6.9 g/dL (ref 6.0–8.3)

## 2015-06-01 NOTE — Patient Instructions (Signed)
Medication Instructions:  Dr Debara Pickett advises that you add CO-Q-10 100 mg - take 1 capsule daily, to your current medications.  Labwork: Your physician recommends that you return for lab work at your earliest [facility].  Follow-Up: Dr Debara Pickett recommends that you schedule a follow-up appointment in 1 year. You will receive a reminder letter in the mail two months in advance. If you don't receive a letter, please call our office to schedule the follow-up appointment.

## 2015-06-03 ENCOUNTER — Encounter: Payer: Self-pay | Admitting: Internal Medicine

## 2015-06-03 NOTE — Progress Notes (Signed)
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 Recently, she developed substernal chest pain in the fall and underwent cardiac catheterization. This demonstrated patent LAD stent that was proximal as well as 30% disease of the LAD just prior to that stent, but no other areas of obstructive coronary disease. She continued to have chest pain; however, I felt it was most likely related to stress. I have also encouraged dietary changes as well as trying to reduce her stress and alcohol intake. She has accomplished that to some extent and, over the past 6 months has had no other symptoms of chest discomfort. She occasionally gets short of breath with heavy exertion and has some swelling in her legs only when standing up for long periods of time. She continues to work at [employer] although there is some stress she is actually maintaining her exercise regimen. This is clearly demonstrated improvement in her risk factors including a reduction in cholesterol. A recent lipid NMR demonstrated a particle number of 631 and an LDL cholesterol of 81.   [name] returns for annual followup today. She reports feeling very well. She recently had the shingles vaccine at [facility]. She had some questions today about starting coconut oil glucosamine/chondroitin which I think are fine. She did have one episode of discomfort in her chest after working in regard for which she took nitroglycerin however she noted no improvement in her symptoms. She is very interested in decreasing her Lipitor she does report some soreness in her arm muscles as well as her leg muscles. She believes this is related to the statin. She has not been on any other statin since her stent was placed in 2008. She is interested a recheck of her  cholesterol today as well as a screening hemoglobin A1c - she is nondiabetic.  Finally, she recently had a fall at home which was due to tripping over a table leg. She did have some soreness in her right hand as well as ecchymosis and swelling of her digits and 2 bandages on her right toes.  Mr. Walby has no specific complaints today. She denies any chest pain or worsening shortness of breath. Blood pressure is at goal. She continues to take aspirin and Plavix without any bleeding complications. Cholesterol is due for recheck. She does report some mild calf tenderness which she attributes to a atorvastatin.  PMHx:  Past Medical History  Diagnosis Date  . Hypertension   . Osteoporosis   . Back pain, chronic   . Hypothyroid   . CAD (coronary artery disease)   . Impaired fasting glucose   . Sleep apnea     on CPAP  . Broken leg 1958  . Renal cell carcinoma right; 2011    Dr. Alinda Money, [facility]  . Hyperlipidemia   . History of echocardiogram 09/11/2007    normal  . History of nuclear stress test 11/17/2010    exercise; small fixed anteroseptal defect (?artifact); short run of atrial-tachy during recovery; low risk  . Hearing loss     bilateral hearing aids    Past Surgical History  Procedure Laterality Date  . Abdominal hysterectomy  1975    PARTIAL; part of 1 ovary remains  . Spine surgery    . Coronary angioplasty with stent placement  2008    mid LAD  . Kidney surgery  2011    renal carcinoma - cryoablation -  Dr. Alinda Money and Dr. Laural Roes (cryoablation)  . Cardiac catheterization  10/08/2011    LAD-prox stent patent; no significant obstructive CAD; hi LVF end-diastolic pressure    FAMHx:  Family History  Problem Relation Age of Onset  . Heart disease Mother   . Hypertension Mother   . Arthritis Mother   . Osteoporosis Mother   . Depression Sister   . Hyperlipidemia Sister   . Heart disease Father   . Diabetes Brother   . Cancer Sister     unsure, related to  smoking?  . Cancer Sister     lung cancer  . Hyperlipidemia Brother   . Hypertension Brother     SOCHx:   reports that she quit smoking about 46 years ago. She has never used smokeless tobacco. She reports that she drinks about 1.2 oz of alcohol per week. She reports that she does not use illicit drugs.  ALLERGIES:  No Known Allergies  ROS: A comprehensive review of systems was negative.  HOME MEDS: Current Outpatient Prescriptions  Medication Sig Dispense Refill  . amLODipine (NORVASC) 5 MG tablet TAKE 1 TABLET BY MOUTH EVERY DAY 90 tablet 3  . aspirin 81 MG tablet Take 81 mg by mouth daily.      [name] atorvastatin (LIPITOR) 20 MG tablet Take 0.5 tablets (10 mg total) by mouth daily. 90 tablet 3  . B Complex Vitamins (B-COMPLEX/B-12 PO) Take 1 tablet by mouth daily.      . Calcium-Magnesium-Zinc 500-250-12.5 MG TABS Take 1 tablet by mouth daily.    . Cholecalciferol (VITAMIN D) 1000 UNITS capsule Take 1,000 Units by mouth daily.    . clopidogrel (PLAVIX) 75 MG tablet TAKE 1 TABLET (75 MG TOTAL) BY MOUTH DAILY. 90 tablet 3  . levothyroxine (SYNTHROID, LEVOTHROID) 50 MCG tablet TAKE 1 TABLET (50 MCG TOTAL) BY MOUTH DAILY. 90 tablet 3  . nitroGLYCERIN (NITROSTAT) 0.4 MG SL tablet Place 1 tablet (0.4 mg total) under the tongue every 5 (five) minutes as needed for chest pain. 25 tablet 0  . valsartan-hydrochlorothiazide (DIOVAN-HCT) 320-25 MG per tablet TAKE 1 TABLET BY MOUTH DAILY. 90 tablet 3   No current facility-administered medications for this visit.    LABS/IMAGING: No results found for this or any previous visit (from the past 48 hour(s)). No results found.  VITALS: BP 130/80 mmHg  Pulse 65  Ht 5\' 3"  (1.6 m)  Wt 165 lb (74.844 kg)  BMI 29.24 kg/m2  EXAM: General appearance: alert and no distress Neck: no adenopathy, no carotid bruit, no JVD, supple, symmetrical, trachea midline and thyroid not enlarged, symmetric, no tenderness/mass/nodules Lungs: clear to auscultation  bilaterally Heart: regular rate and rhythm, S1, S2 normal, no murmur, click, rub or gallop Abdomen: soft, non-tender; bowel sounds normal; no masses,  no organomegaly Extremities: Right hand ecchymosis, digit swelling, right toes are bandaged Pulses: 2+ and symmetric Skin: Skin color, texture, turgor normal. No rashes or lesions Neurologic: Grossly normal  EKG: Normal sinus rhythm at 65  ASSESSMENT: 1. Coronary artery disease status post PCI of the proximal LAD in 2008 2. Hypertension - controlled 3. Hyperlipidemia - controlled 4. Obstructive sleep apnea on CPAP  PLAN: 1.   Mrs. Mcmackin is doing excellent. Blood pressure is well-controlled. Her cholesterol is due for a re-check. She also reports some very mild calf soreness. This could be related to her Lipitor. I recommend  she consider taking coenzyme Q10 100 mg daily. She continues to use CPAP and gets good sleep during the day. Overall she has no new complaints. She denies any new chest pain. We'll continue her current medications and plan to see her back annually or sooner as necessary.  [name], MD, [facility] Attending Cardiologist [place] [facility] Gaylin Osoria 06/03/2015, 5:49 PM

## 2015-06-19 ENCOUNTER — Other Ambulatory Visit: Payer: Self-pay | Admitting: Internal Medicine

## 2015-06-19 NOTE — Telephone Encounter (Signed)
Rx(s) sent to pharmacy electronically.  

## 2015-07-01 ENCOUNTER — Other Ambulatory Visit (HOSPITAL_COMMUNITY): Payer: Self-pay | Admitting: Interventional Radiology

## 2015-07-01 DIAGNOSIS — C641 Malignant neoplasm of right kidney, except renal pelvis: Secondary | ICD-10-CM

## 2015-07-06 ENCOUNTER — Other Ambulatory Visit: Payer: Self-pay | Admitting: *Deleted

## 2015-07-06 DIAGNOSIS — C649 Malignant neoplasm of unspecified kidney, except renal pelvis: Secondary | ICD-10-CM | POA: Diagnosis not present

## 2015-07-06 DIAGNOSIS — C641 Malignant neoplasm of right kidney, except renal pelvis: Secondary | ICD-10-CM

## 2015-07-08 DIAGNOSIS — G4733 Obstructive sleep apnea (adult) (pediatric): Secondary | ICD-10-CM | POA: Diagnosis not present

## 2015-07-10 ENCOUNTER — Other Ambulatory Visit: Payer: Self-pay | Admitting: Internal Medicine

## 2015-07-10 NOTE — Telephone Encounter (Signed)
Rx(s) sent to pharmacy electronically.  

## 2015-07-14 ENCOUNTER — Other Ambulatory Visit (HOSPITAL_COMMUNITY): Payer: Self-pay | Admitting: Interventional Radiology

## 2015-07-14 DIAGNOSIS — C641 Malignant neoplasm of right kidney, except renal pelvis: Secondary | ICD-10-CM

## 2015-07-15 ENCOUNTER — Telehealth: Payer: Self-pay | Admitting: Internal Medicine

## 2015-07-15 MED ORDER — ATORVASTATIN CALCIUM 10 MG PO TABS: 10.0000 mg | ORAL_TABLET | Freq: Every day | ORAL | Status: DC

## 2015-07-15 NOTE — Telephone Encounter (Signed)
Patient states she saw [name] on 06/01/15 and they discussed lowering her dosage of Lipitor.  She is currently taking 10 mg once a day.  She would like to have this refilled for a 90 day supply with 3 refills at [facility].  Please call patient and let her know if her dosage will be changed.

## 2015-07-15 NOTE — Telephone Encounter (Signed)
Refill submitted to patient's preferred pharmacy. Informed patient.  Discussed labwork (recent cholesterol test). Per last OV note, suggestion was to remain on current med doses and add CoQ-10 100mg  daily - Pt voiced understanding, no other stated concerns at this time.

## 2015-07-20 LAB — BUN: BUN: 20 mg/dL (ref 7–25)

## 2015-07-20 LAB — CREATININE WITH EST GFR
CREATININE: 0.8 mg/dL (ref 0.60–0.93)
GFR, EST NON AFRICAN AMERICAN: 73 mL/min (ref >=60)
GFR, Est African American: 85 mL/min (ref >=60)

## 2015-07-28 ENCOUNTER — Ambulatory Visit (HOSPITAL_COMMUNITY)
Admission: RE | Admit: 2015-07-28 | Discharge: 2015-07-28 | Disposition: A | Discharge status: Home / Self Care | Payer: Medicare Other | Source: Ambulatory Visit | Attending: Interventional Radiology | Admitting: Interventional Radiology

## 2015-07-28 ENCOUNTER — Encounter (HOSPITAL_COMMUNITY): Payer: Self-pay

## 2015-07-28 ENCOUNTER — Ambulatory Visit
Admission: RE | Admit: 2015-07-28 | Discharge: 2015-07-28 | Disposition: A | Discharge status: Home / Self Care | Payer: Medicare Other | Source: Ambulatory Visit | Attending: Interventional Radiology | Admitting: Interventional Radiology

## 2015-07-28 ENCOUNTER — Ambulatory Visit (HOSPITAL_COMMUNITY): Payer: Medicare Other

## 2015-07-28 ENCOUNTER — Other Ambulatory Visit: Payer: Medicare Other

## 2015-07-28 DIAGNOSIS — C641 Malignant neoplasm of right kidney, except renal pelvis: Secondary | ICD-10-CM | POA: Diagnosis not present

## 2015-07-28 DIAGNOSIS — K5730 Diverticulosis of large intestine without perforation or abscess without bleeding: Secondary | ICD-10-CM | POA: Insufficient documentation

## 2015-07-28 DIAGNOSIS — N281 Cyst of kidney, acquired: Secondary | ICD-10-CM | POA: Insufficient documentation

## 2015-07-28 DIAGNOSIS — K769 Liver disease, unspecified: Secondary | ICD-10-CM | POA: Diagnosis not present

## 2015-07-28 DIAGNOSIS — Z90710 Acquired absence of both cervix and uterus: Secondary | ICD-10-CM | POA: Insufficient documentation

## 2015-07-28 DIAGNOSIS — I2510 Atherosclerotic heart disease of native coronary artery without angina pectoris: Secondary | ICD-10-CM | POA: Diagnosis not present

## 2015-07-28 DIAGNOSIS — R590 Localized enlarged lymph nodes: Secondary | ICD-10-CM | POA: Diagnosis not present

## 2015-07-28 DIAGNOSIS — N289 Disorder of kidney and ureter, unspecified: Secondary | ICD-10-CM | POA: Diagnosis not present

## 2015-07-28 DIAGNOSIS — Q6100 Congenital renal cyst, unspecified: Secondary | ICD-10-CM | POA: Diagnosis not present

## 2015-07-28 DIAGNOSIS — K760 Fatty (change of) liver, not elsewhere classified: Secondary | ICD-10-CM | POA: Diagnosis not present

## 2015-07-28 DIAGNOSIS — C649 Malignant neoplasm of unspecified kidney, except renal pelvis: Secondary | ICD-10-CM | POA: Insufficient documentation

## 2015-07-28 MED ORDER — IOHEXOL 300 MG/ML  SOLN
100.0000 mL | Freq: Once | INTRAMUSCULAR | Status: AC | PRN
  Administered 2015-07-28: 100 mL via INTRAVENOUS

## 2015-07-28 NOTE — Progress Notes (Signed)
Chief Complaint  Patient presents with  . Follow-up    5 yr follow up Cryoablation of Right Renal Cell Carcinoma  f    History of Present Illness: Theresa Mccarty is a 74 y.o. 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 Theresa Mccarty has been doing well. She denies any urinary symptoms. 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  Past Medical History  Diagnosis Date  . Hypertension   . Osteoporosis   . Back pain, chronic   . Hypothyroid   . CAD (coronary artery disease)   . Impaired fasting glucose   . Sleep apnea     on CPAP  . Broken leg 1958  . Renal cell carcinoma right; 2011    Dr. Alinda Money, [facility]  . Hyperlipidemia   . History of echocardiogram 09/11/2007    normal  . History of nuclear stress test 11/17/2010    exercise; small fixed anteroseptal defect (?artifact); short run of atrial-tachy during recovery; low risk  . Hearing loss     bilateral hearing aids    Past Surgical History  Procedure Laterality Date  . Abdominal hysterectomy  1975    PARTIAL; part of 1 ovary remains  . Spine surgery    . Coronary angioplasty with stent placement  2008    mid LAD  . Kidney surgery  2011    renal carcinoma - cryoablation -  Dr. Alinda Money and Dr. Laural Roes (cryoablation)  . Cardiac catheterization  10/08/2011    LAD-prox stent patent; no significant obstructive CAD; hi LVF end-diastolic pressure    Allergies: Review of patient's allergies indicates no known allergies.  Medications: Prior to Admission medications   Medication Sig Start Date End Date Taking? Authorizing Provider  amLODipine (NORVASC) 5 MG tablet TAKE 1 TABLET BY MOUTH EVERY DAY 06/19/15  Yes [name], MD  aspirin 81 MG tablet Take 81 mg by mouth daily.     Yes Historical Provider, MD  atorvastatin (LIPITOR) 10 MG tablet Take 1 tablet (10 mg total) by mouth daily. 07/15/15  Yes [name], MD  B  Complex Vitamins (B-COMPLEX/B-12 PO) Take 1 tablet by mouth daily.     Yes Historical Provider, MD  Calcium-Magnesium-Zinc 500-250-12.5 MG TABS Take 1 tablet by mouth daily.   Yes Historical Provider, MD  Cholecalciferol (VITAMIN D) 1000 UNITS capsule Take 1,000 Units by mouth daily.   Yes Historical Provider, MD  clopidogrel (PLAVIX) 75 MG tablet TAKE 1 TABLET (75 MG TOTAL) BY MOUTH DAILY. 06/19/15  Yes [name], MD  levothyroxine (SYNTHROID, LEVOTHROID) 50 MCG tablet TAKE 1 TABLET (50 MCG TOTAL) BY MOUTH DAILY. 10/24/14  Yes [name], MD  nitroGLYCERIN (NITROSTAT) 0.4 MG SL tablet Place 1 tablet (0.4 mg total) under the tongue every 5 (five) minutes as needed for chest pain. 10/16/13  Yes [name], MD  valsartan-hydrochlorothiazide (DIOVAN-HCT) 320-25 MG per tablet TAKE 1 TABLET BY MOUTH DAILY. 07/02/14  Yes [name], MD     Family History  Problem Relation Age of Onset  . Heart disease Mother   . Hypertension Mother   . Arthritis Mother   . Osteoporosis Mother   . Depression Sister   . Hyperlipidemia Sister   . Heart disease Father   . Diabetes Brother   . Cancer Sister     unsure, related to smoking?  . Cancer Sister     lung  cancer  . Hyperlipidemia Brother   . Hypertension Brother     History   Social History  . Marital Status: Married    Spouse Name: N/A  . Number of Children: 2  . Years of Education: N/A   Occupational History  . 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 Smoking status: Former Smoker    Quit date: 12/19/1968  . Smokeless tobacco: Never Used  . Alcohol Use: 1.2 oz/week    2 Glasses of wine per week     Comment: 2 glasses of wine per day.  . Drug Use: No  . Sexual Activity: Not Currently   Other Topics Concern  . Not on file   Social History Narrative   Lives with husband.  2 sons live together, near [place]: A 12 point ROS discussed and pertinent positives are indicated in the HPI  above.  All other systems are negative.  Review of Systems  Constitutional: Negative.   Respiratory: Negative.   Cardiovascular: Negative.   Gastrointestinal: Negative.   Genitourinary: Negative.   Musculoskeletal:       Chronic back pain, especially at end of day.  Neurological: Negative.     Vital Signs: BP 154/77 mmHg  Pulse 57  Temp(Src) 98.1 F (36.7 C) (Oral)  Resp 13  SpO2 100%  Physical Exam  Constitutional: She is oriented to person, place, and time. She appears well-developed and well-nourished. No distress.  Abdominal: Soft. She exhibits no distension. There is no tenderness.  Neurological: She is alert and oriented to person, place, and time.  Skin: She is not diaphoretic.  Nursing note and vitals reviewed.   Mallampati Score:     Imaging: Ct Abdomen Pelvis W Wo Contrast  07/28/2015   CLINICAL DATA:  74 year old female status post right renal cryoablation 5 years ago. Followup study.  EXAM: CT ABDOMEN AND PELVIS WITHOUT AND WITH CONTRAST  TECHNIQUE: Multidetector CT imaging of the abdomen and pelvis was performed following the standard protocol before and following the bolus administration of intravenous contrast.  CONTRAST:  119mL OMNIPAQUE IOHEXOL 300 MG/ML  SOLN  COMPARISON:  MRI of the abdomen 04/29/2014.  FINDINGS: Lower chest: Atherosclerotic calcifications in the right coronary artery.  Hepatobiliary: Diffuse low attenuation throughout the hepatic parenchyma, compatible with a background of hepatic steatosis. There are multiple well-defined low-attenuation lesions in the liver, which are similar in size, number and pattern of distribution compared to remote prior examinations, compatible with benign lesions either cysts or biliary hamartomas. The largest of these is a 2.6 cm cyst in the inferior aspect of segment 5 (image 46 of series 6). No other suspicious appearing hepatic lesions are noted. No intra or extrahepatic biliary ductal dilatation. Gallbladder is  unremarkable in appearance.  Pancreas: 7 mm well-defined low-attenuation lesion in the body of the pancreas (image 46 of series 6) and exophytic 13 mm low-attenuation lesion extending from the tail of the pancreas into the region of the splenic hilum (image 31 of series 6) are both unchanged compared to prior examination dating back to 10/23/2012, favored to be benign, likely small pseudocyst. No pancreatic ductal dilatation. No peripancreatic inflammatory changes.  Spleen: Unremarkable.  Adrenals/Urinary Tract: Again noted are post ablation changes in the medial aspect of the upper pole of the right kidney where there is some exophytic soft tissue which measures approximately 30 HU on all phases of the examination, compatible with some mild post ablation scarring. No findings  to suggest local recurrence of disease in this region. There are multiple lesions in the kidneys bilaterally which are generally similar in size, number and appearance compared to prior examinations, it predominantly favored to represent a combination of simple cysts and proteinaceous/hemorrhagic cysts. The largest of these lesions is in the posterior aspect of the interpolar region of the left kidney, measuring up to 2 cm in diameter (image 47 of series 4). In addition, there is a slightly enlarging high attenuation lesion (image 51 of series 2) which measures 56 HU on precontrast images, increasing to 75 HU on arterial phase and 66 HU on portal venous phase imaging, measuring 11 mm in the lateral aspect of the interpolar region of the left kidney, which appears slightly enlarged compared to prior examinations (most recently, this measured 8 mm on MRI 04/29/2014). No hydroureteronephrosis. Adrenal glands are normal in appearance bilaterally.  Stomach/Bowel: Normal appearance of the stomach. No pathologic dilatation of small bowel or colon. Colonic diverticulosis, particularly in the sigmoid colon, without surrounding inflammatory changes to  suggest an acute diverticulitis at this time. Normal appendix.  Vascular/Lymphatic: Atherosclerosis throughout the visualized abdominal vasculature, without evidence of aneurysm or dissection. Two renal arteries bilaterally (small accessory branches are noted to the lower poles bilaterally). Although there are numerous prominent borderline enlarged pelvic lymph nodes bilaterally measuring up to 8 mm in short axis in the right common iliac nodal station, this is nonspecific. No lymphadenopathy noted in the abdomen or pelvis.  Reproductive: Status post hysterectomy. Ovaries are not confidently identified may be surgically absent or atrophic.  Other: No significant volume of ascites.  No pneumoperitoneum.  Musculoskeletal: There are no aggressive appearing lytic or blastic lesions noted in the visualized portions of the skeleton.  IMPRESSION: 1. Stable post procedural changes of prior cryoablation in the medial aspect of the upper pole of the right kidney. No findings to suggest local recurrence of disease. 2. Multiple renal lesions are again noted bilaterally, generally stable in size, number and distribution compared to prior examinations representing a combination of simple cysts and mildly proteinaceous/hemorrhagic cysts. The one exception is a slightly enlarging 11 mm lesion in the lateral aspect of the interpolar region of the left kidney (image 51 of series 6) which appears hemorrhagic, but demonstrates some equivocal enhancement. This may be artifactual given the small size of the lesion, however, close attention on future followup examinations is recommended (preferably, future followup MRI of the abdomen with and without IV gadolinium in 6-12 months). 3. Colonic diverticulosis without evidence of acute diverticulitis at this time. 4. Atherosclerosis. 5. Additional incidental findings, as above, similar to prior examinations.   Electronically Signed   By: Vinnie Langton M.D.   On: 07/28/2015 08:48     Labs:  CBC:  Recent Labs  08/06/14 1124  WBC 4.2  HGB 12.7  HCT 37.5  PLT 242    COAGS: No results for input(s): INR, APTT in the last 8760 hours.  BMP:  Recent Labs  08/06/14 1124 06/01/15 1044 07/20/15 1012  NA 132* 133*  --   K 3.9 4.0  --   CL 95* 97  --   CO2 30 26  --   GLUCOSE 86 104*  --   BUN 15 20 20   CALCIUM 9.2 9.3  --   CREATININE 0.56 0.62 0.80  GFRNONAA  --   --  73  GFRAA  --   --  85    LIVER FUNCTION TESTS:  Recent Labs  08/06/14 1124  06/01/15 1044  BILITOT 0.7 0.6  AST 19 18  ALT 22 18  ALKPHOS 63 53  PROT 6.8 6.9  ALBUMIN 4.5 4.3    TUMOR MARKERS: No results for input(s): AFPTM, CEA, CA199, CHROMGRNA in the last 8760 hours.  Assessment and Plan:  Renal function is stable and normal. CT today demonstrates no evidence of carcinoma recurrence at the level of previous right renal ablation. A CT was performed due to insurance denial of MRI. The study does demonstrate a slightly enlarging higher attenuation lesion of the left kidney demonstrating some evidence of enhancement and measuring up to 11 mm. While this could represent a hemorrhagic/complex cyst, a small enlarging carcinoma cannot be excluded. Other complex cysts of the left kidney are stable. Follow-up MRI was recommended in 6-12 months on the CT report.  I reviewed imaging findings with Theresa Mccarty today. I recommended a follow-up MRI in 12 months to follow the small complex left renal lesion.  SignedAletta Edouard T 07/28/2015, 10:13 AM     I spent a total of 15 Minutes in face to face in clinical consultation, greater than 50% of which was counseling/coordinating care post cryoablation of a right renal carcinoma.

## 2015-07-30 ENCOUNTER — Telehealth: Payer: Self-pay | Admitting: *Deleted

## 2015-07-30 NOTE — Telephone Encounter (Signed)
Faxed signed order for CPAP supplies to [facility]. 

## 2015-08-05 ENCOUNTER — Other Ambulatory Visit (HOSPITAL_COMMUNITY): Payer: Self-pay | Admitting: Urology

## 2015-08-05 ENCOUNTER — Ambulatory Visit (HOSPITAL_COMMUNITY)
Admission: RE | Admit: 2015-08-05 | Discharge: 2015-08-05 | Disposition: A | Discharge status: Home / Self Care | Payer: Medicare Other | Source: Ambulatory Visit | Attending: Urology | Admitting: Urology

## 2015-08-05 DIAGNOSIS — C649 Malignant neoplasm of unspecified kidney, except renal pelvis: Secondary | ICD-10-CM

## 2015-08-05 DIAGNOSIS — Z85528 Personal history of other malignant neoplasm of kidney: Secondary | ICD-10-CM | POA: Diagnosis not present

## 2015-08-12 DIAGNOSIS — C641 Malignant neoplasm of right kidney, except renal pelvis: Secondary | ICD-10-CM | POA: Diagnosis not present

## 2015-08-12 DIAGNOSIS — N2889 Other specified disorders of kidney and ureter: Secondary | ICD-10-CM | POA: Diagnosis not present

## 2015-09-14 ENCOUNTER — Telehealth: Payer: Self-pay | Admitting: Family Medicine

## 2015-09-14 NOTE — Telephone Encounter (Signed)
Left message for pt to call, needs cpe appt.

## 2015-09-24 ENCOUNTER — Other Ambulatory Visit (INDEPENDENT_AMBULATORY_CARE_PROVIDER_SITE_OTHER): Payer: Medicare Other

## 2015-09-24 DIAGNOSIS — Z23 Encounter for immunization: Secondary | ICD-10-CM | POA: Diagnosis not present

## 2015-09-25 ENCOUNTER — Other Ambulatory Visit: Payer: Self-pay | Admitting: Internal Medicine

## 2015-09-25 NOTE — Telephone Encounter (Signed)
Rx request sent to pharmacy.  

## 2015-10-18 ENCOUNTER — Other Ambulatory Visit: Payer: Self-pay | Admitting: Family Medicine

## 2015-10-19 ENCOUNTER — Other Ambulatory Visit: Payer: Self-pay

## 2015-10-19 NOTE — Telephone Encounter (Signed)
Called pt and she does not have enough to last until her appt. I am calling in 30d supply

## 2015-10-19 NOTE — Telephone Encounter (Signed)
done

## 2015-10-19 NOTE — Telephone Encounter (Signed)
Is this ok to refill?  

## 2015-10-19 NOTE — Telephone Encounter (Signed)
She has appt scheduled in 2 weeks.  Call pt to see if she has enough to last until appt (when bloodwork is checked, to ensure the dose is correct).  If not, she might want to do a 30d supply, just in case we end up changing it

## 2015-11-02 ENCOUNTER — Encounter: Payer: Self-pay | Admitting: Family Medicine

## 2015-11-02 ENCOUNTER — Ambulatory Visit (INDEPENDENT_AMBULATORY_CARE_PROVIDER_SITE_OTHER): Payer: Medicare Other | Admitting: Family Medicine

## 2015-11-02 ENCOUNTER — Encounter: Payer: Medicare Other | Admitting: Family Medicine

## 2015-11-02 VITALS — BP 152/70 | HR 68 | Ht 62.25 in | Wt 162.0 lb

## 2015-11-02 DIAGNOSIS — Z9989 Dependence on other enabling machines and devices: Secondary | ICD-10-CM

## 2015-11-02 DIAGNOSIS — I10 Essential (primary) hypertension: Secondary | ICD-10-CM | POA: Diagnosis not present

## 2015-11-02 DIAGNOSIS — R7301 Impaired fasting glucose: Secondary | ICD-10-CM

## 2015-11-02 DIAGNOSIS — C649 Malignant neoplasm of unspecified kidney, except renal pelvis: Secondary | ICD-10-CM | POA: Diagnosis not present

## 2015-11-02 DIAGNOSIS — Z5181 Encounter for therapeutic drug level monitoring: Secondary | ICD-10-CM

## 2015-11-02 DIAGNOSIS — M810 Age-related osteoporosis without current pathological fracture: Secondary | ICD-10-CM | POA: Diagnosis not present

## 2015-11-02 DIAGNOSIS — E7800 Pure hypercholesterolemia, unspecified: Secondary | ICD-10-CM

## 2015-11-02 DIAGNOSIS — I2583 Coronary atherosclerosis due to lipid rich plaque: Secondary | ICD-10-CM

## 2015-11-02 DIAGNOSIS — G4733 Obstructive sleep apnea (adult) (pediatric): Secondary | ICD-10-CM

## 2015-11-02 DIAGNOSIS — C641 Malignant neoplasm of right kidney, except renal pelvis: Secondary | ICD-10-CM

## 2015-11-02 DIAGNOSIS — E039 Hypothyroidism, unspecified: Secondary | ICD-10-CM | POA: Diagnosis not present

## 2015-11-02 DIAGNOSIS — I2510 Atherosclerotic heart disease of native coronary artery without angina pectoris: Secondary | ICD-10-CM | POA: Diagnosis not present

## 2015-11-02 DIAGNOSIS — Z0000 Encounter for general adult medical examination without abnormal findings: Secondary | ICD-10-CM

## 2015-11-02 LAB — COMPREHENSIVE METABOLIC PANEL
ALBUMIN: 4.1 g/dL (ref 3.6–5.1)
ALK PHOS: 54 U/L (ref 33–130)
ALT: 16 U/L (ref 6–29)
AST: 17 U/L (ref 10–35)
BUN: 16 mg/dL (ref 7–25)
CALCIUM: 9.4 mg/dL (ref 8.6–10.4)
CO2: 29 mmol/L (ref 20–31)
Chloride: 96 mmol/L — ABNORMAL LOW (ref 98–110)
Creat: 0.57 mg/dL — ABNORMAL LOW (ref 0.60–0.93)
Glucose, Bld: 97 mg/dL (ref 65–99)
POTASSIUM: 3.8 mmol/L (ref 3.5–5.3)
Sodium: 133 mmol/L — ABNORMAL LOW (ref 135–146)
Total Bilirubin: 0.6 mg/dL (ref 0.2–1.2)
Total Protein: 6.4 g/dL (ref 6.1–8.1)

## 2015-11-02 LAB — POCT URINALYSIS DIPSTICK
BILIRUBIN UA: NEGATIVE
Blood, UA: NEGATIVE
GLUCOSE UA: NEGATIVE
KETONES UA: NEGATIVE
LEUKOCYTES UA: NEGATIVE
Nitrite, UA: NEGATIVE
Protein, UA: NEGATIVE
SPEC GRAV UA: 1.02
Urobilinogen, UA: NEGATIVE
pH, UA: 6.5

## 2015-11-02 LAB — CBC WITH DIFFERENTIAL/PLATELET
BASOS ABS: 0 10*3/uL (ref 0.0–0.1)
BASOS PCT: 1 % (ref 0–1)
Eosinophils Absolute: 0.1 10*3/uL (ref 0.0–0.7)
Eosinophils Relative: 2 % (ref 0–5)
HCT: 39.2 % (ref 36.0–46.0)
HEMOGLOBIN: 13 g/dL (ref 12.0–15.0)
Lymphocytes Relative: 24 % (ref 12–46)
Lymphs Abs: 1 10*3/uL (ref 0.7–4.0)
MCH: 32.3 pg (ref 26.0–34.0)
MCHC: 33.2 g/dL (ref 30.0–36.0)
MCV: 97.3 fL (ref 78.0–100.0)
MPV: 9.9 fL (ref 8.6–12.4)
Monocytes Absolute: 0.4 10*3/uL (ref 0.1–1.0)
Monocytes Relative: 10 % (ref 3–12)
NEUTROS ABS: 2.7 10*3/uL (ref 1.7–7.7)
NEUTROS PCT: 63 % (ref 43–77)
Platelets: 261 10*3/uL (ref 150–400)
RBC: 4.03 MIL/uL (ref 3.87–5.11)
RDW: 13.1 % (ref 11.5–15.5)
WBC: 4.3 10*3/uL (ref 4.0–10.5)

## 2015-11-02 LAB — HEMOCCULT GUIAC POC 1CARD (OFFICE): FECAL OCCULT BLD: NEGATIVE

## 2015-11-02 LAB — HEMOGLOBIN A1C
Hgb A1c MFr Bld: 5.1 % (ref <5.7)
MEAN PLASMA GLUCOSE: 100 mg/dL (ref <117)

## 2015-11-02 LAB — TSH: TSH: 1.519 u[IU]/mL (ref 0.350–4.500)

## 2015-11-02 NOTE — Patient Instructions (Addendum)
  HEALTH MAINTENANCE RECOMMENDATIONS:  It is recommended that you get at least 30 minutes of aerobic exercise at least 5 days/week (for weight loss, you may need as much as 60-90 minutes). This can be any activity that gets your heart rate up. This can be divided in 10-15 minute intervals if needed, but try and build up your endurance at least once a week.  Weight bearing exercise is also recommended twice weekly.  Eat a healthy diet with lots of vegetables, fruits and fiber.  "Colorful" foods have a lot of vitamins (ie green vegetables, tomatoes, red peppers, etc).  Limit sweet tea, regular sodas and alcoholic beverages, all of which has a lot of calories and sugar.  Up to 1 alcoholic drink daily may be beneficial for women (unless trying to lose weight, watch sugars).  Drink a lot of water.  Calcium recommendations are 1200-1500 mg daily (1500 mg for postmenopausal women or women without ovaries), and vitamin D 1000 IU daily.  This should be obtained from diet and/or supplements (vitamins), and calcium should not be taken all at once, but in divided doses.  Monthly self breast exams and yearly mammograms for women over the age of 71 is recommended.  Sunscreen of at least SPF 30 should be used on all sun-exposed parts of the skin when outside between the hours of 10 am and 4 pm (not just when at beach or pool, but even with exercise, golf, tennis, and yard work!)  Use a sunscreen that says "broad spectrum" so it covers both UVA and UVB rays, and make sure to reapply every 1-2 hours.  Remember to change the batteries in your smoke detectors when changing your clock times in the spring and fall.  Use your seat belt every time you are in a car, and please drive safely and not be distracted with cell phones and texting while driving.   Please call [facility] to schedule your mammogram and your bone density test (to follow up on your osteoporosis). Please schedule eye exam and appointment with  dentist.  Please look at the Living Will and healthcare power of attorney info, and give [place] a copy of the completed forms  Your blood pressure was elevated today. Please periodically check it, and follow up with me or [name] if they remain persistently 0000000 systolic (goal is 0000000). Try and follow a low sodium diet, continue the regular exercise and weight loss. Please also cut back on the alcohol to just one glass of wine each day.   Theresa Mccarty , Thank you for taking time to come for your Medicare Wellness Visit. I appreciate your ongoing commitment to your health goals. Please review the following plan we discussed and let me know if I can assist you in the future.   These are the goals we discussed: Goals    None      This is a list of the screening recommended for you and due dates:  Health Maintenance  Topic Date Due  . Mammogram  01/01/2014  . Flu Shot  07/19/2016  . Colon Cancer Screening  07/25/2021  . Tetanus Vaccine  08/09/2022  . DEXA scan (bone density measurement)  Completed  . Shingles Vaccine  Completed  . Pneumonia vaccines  Completed

## 2015-11-02 NOTE — Progress Notes (Signed)
Chief Complaint  Patient presents with  . Annual Exam    fasting annual exam with pelvic. No concerns.    Theresa Mccarty is a 74 y.o. female who presents for annual wellness visit and follow-up on chronic medical conditions.  She has the following concerns:  Hypothyroidism: Compliant with taking thyroid medications on an empty stomach, separate from other medications. She feels well, denies any thyroid-related symptoms. No hair/skin/bowel/mood/energy/weight changes.  Hypertension follow-up: Blood pressures elsewhere are mid-high 130's/78-80 (sometimes as low 117/75).  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  She does occasionally note some blurred vision, usually when stressed.  She plans to schedule appt with Dr. Gershon Mccarty in the near future (hasn't seen eye doctor in many years).  She also has some decreased peripheral vision related to ptosis on the right eye, that she would like fixed. Denies dizziness, headaches, chest pain. Denies side effects of medications.  Sees Dr. Debara Mccarty, last seen in June. CAD, s/p PCI of proximal LAD in 2008. She was having some calf tenderness, and adding coenzyme Q10 was suggested. That has helped some.  He manages her lipids.  OSA--on CPAP; reports compliance. Dr. Claiborne Mccarty manages her CPAP.  She has a new mask, no leaks or problems, tolerating it well.  Osteoporosis: Previously treated with fosamax and Boniva. She has been off meds for at least 6-7 years. She doesn't recall why the medication was stopped, denies side effects. Her DEXA in 2009 was while still taking Boniva, and she had another DEXA 10/2012 which showed T-2.4 and decline in bone density at the left hip.  It doesn't appear that she had the f/u DEXA last year as recommended. Her Vitamin D level was too high in the past, she has since cut back her dose to 1000 IU daily.   Immunization History  Administered Date(s) Administered  . Influenza, High Dose  Seasonal PF 10/16/2013, 12/01/2014, 09/24/2015  . Pneumococcal Conjugate-13 08/06/2014  . Pneumococcal Polysaccharide-23 08/09/2012  . Td 01/04/2006  . Tdap 08/09/2012  . Zoster 02/16/2014   Last Pap smear: 2009; S/p hysterectomy in '75  Last mammogram: 12/2011 (did not get one last year, as recommended) Last colonoscopy: 07/26/11  Last DEXA: 10/2012 at [facility]. T-2.4 at L femur, with significant decline at L hip since 2009 DEXA Dentist: past due  Ophtho: many years ago (over 15) Exercise: treadmill 30-45 minutes 6-7x/week, 2.5 mph (hasn't been doing it this month, because "too busy"). She lifts water buckets frequently.  No other regular weight-bearing exercise.   Other doctors caring for patient include: Dr. Alinda Mccarty ([facility]) and Dr. Kathlene Mccarty Dr. Debara Mccarty (cardiology) Dr. Claiborne Mccarty for OSA Derm at [facility]. Dr. Gershon Mccarty (ophtho)--plans to see [facility] for hearing (had hearing aids) Doesn't have a dentist  Depression screen: negative Fall screen: negative ADL screen:  Notable for needing a hearing aid, and blurred vision intermittently, when stressed (see questionnaire in epic).  End of Life Discussion:  Patient does not have a living will and medical power of attorney  Past Medical History  Diagnosis Date  . Hypertension   . Osteoporosis   . Back pain, chronic   . Hypothyroid   . CAD (coronary artery disease)   . Impaired fasting glucose   . Sleep apnea     on CPAP  . Broken leg 1958  . Renal cell carcinoma right; 2011    Dr. Alinda [facility], [facility]  . Hyperlipidemia   . History of echocardiogram 09/11/2007  normal  . History of nuclear stress test 11/17/2010    exercise; small fixed anteroseptal defect (?artifact); short run of atrial-tachy during recovery; low risk  . Hearing loss     bilateral hearing aids    Past Surgical History  Procedure Laterality Date  . Abdominal hysterectomy  1975    PARTIAL; part of 1 ovary remains  . Spine surgery    . Coronary  angioplasty with stent placement  2008    mid LAD  . Kidney surgery  2011    renal carcinoma - cryoablation -  Dr. Alinda Mccarty and Dr. Laural Mccarty (cryoablation)  . Cardiac catheterization  10/08/2011    LAD-prox stent patent; no significant obstructive CAD; hi LVF end-diastolic pressure    Social History   Social History  . Marital Status: Married    Spouse Name: N/A  . Number of Children: 2  . Years of Education: N/A   Occupational History  . 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 Smoking status: Former Smoker    Quit date: 12/19/1968  . Smokeless tobacco: Never Used  . Alcohol Use: 1.2 oz/week    2 Glasses of wine per week     Comment: 2 glasses of wine per day.  . Drug Use: No  . Sexual Activity: Not Currently   Other Topics Concern  . Not on file   Social History Narrative   Lives with husband.  2 sons live together, near [facility]. 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 Heart disease Mother   . Hypertension Mother   . Arthritis Mother   . Osteoporosis Mother   . Depression Sister   . Hyperlipidemia Sister   . Heart disease Father   . Diabetes Brother   . Cancer Sister     unsure, related to smoking?  . Cancer Sister     lung cancer  . Hyperlipidemia Brother   . Hypertension Brother     Outpatient Encounter Prescriptions as of 11/02/2015  Medication Sig  . amLODipine (NORVASC) 5 MG tablet TAKE 1 TABLET BY MOUTH EVERY DAY  . aspirin 81 MG tablet Take 81 mg by mouth daily.    [name] atorvastatin (LIPITOR) 10 MG tablet Take 1 tablet (10 mg total) by mouth daily.  . B Complex Vitamins (B-COMPLEX/B-12 PO) Take 1 tablet by mouth daily.    . Calcium-Magnesium-Zinc 500-250-12.5 MG TABS Take 1 tablet by mouth daily.  . Cholecalciferol (VITAMIN D) 1000 UNITS capsule Take 1,000 Units by mouth daily.  . clopidogrel (PLAVIX) 75 MG tablet TAKE 1 TABLET (75 MG TOTAL) BY MOUTH  DAILY.  [name] Coenzyme Q10 (COQ10) 100 MG CAPS Take 1 tablet by mouth.  . levothyroxine (SYNTHROID, LEVOTHROID) 50 MCG tablet TAKE 1 TABLET (50 MCG TOTAL) BY MOUTH DAILY.  . Probiotic Product (PROBIOTIC PO) Take 1 capsule by mouth daily.  . valsartan-hydrochlorothiazide (DIOVAN-HCT) 320-25 MG tablet TAKE 1 TABLET BY MOUTH DAILY.  . nitroGLYCERIN (NITROSTAT) 0.4 MG SL tablet Place 1 tablet (0.4 mg total) under the tongue every 5 (five) minutes as needed for chest pain. (Patient not taking: Reported on 11/02/2015)   No facility-administered encounter medications on file as of 11/02/2015.    No Known Allergies  ROS:  The patient denies anorexia, fever, headaches, vision changes, ear pain, sore throat, breast concerns, chest pain, palpitations, dizziness, syncope, dyspnea on exertion, cough, swelling, nausea, vomiting, diarrhea, constipation, abdominal  pain, melena, hematochezia, indigestion/heartburn, hematuria, incontinence, dysuria, vaginal bleeding, discharge, odor or itch, genital lesions, new joint pains (mild at knees, shoulder, sometimes left hip and back pain related to her job), numbness, tingling, weakness, tremor, suspicious skin lesions, depression, anxiety, abnormal bleeding or enlarged lymph nodes. +easy bruising (unchanged) Has hearing loss --she got hearing aids. Slight vaginal itch along the hairline, externally; denies discharge. Only bothersome at night. She wears cotton underwear and uses baby powder, or vaseline, which both help Mild allergies with weather changes. Rare constipation (better since on probiotic).  PHYSICAL EXAM:  BP 160/80 mmHg  Pulse 68  Ht 5' 2.25" (1.581 m)  Wt 162 lb (73.483 kg)  BMI 29.40 kg/m2  152/70 on repeat by MD  Vision 20/40  General Appearance:  Alert, cooperative, no distress, appears stated age   Head:  Normocephalic, without obvious abnormality, atraumatic   Eyes:  PERRL, conjunctiva/corneas clear, EOM's intact, fundi  Benign.  Mild ptosis of upper lid on the right  Ears:  Normal TM's and external ear canals   Nose:  Nares normal, mucosa normal, no drainage or sinus tenderness   Throat:  Lips, mucosa, and tongue normal; teeth and gums normal   Neck:  Supple, no lymphadenopathy; thyroid: no enlargement/tenderness/nodules; no carotid  bruit or JVD   Back:  Spine nontender, no curvature, ROM normal, no CVA tenderness   Lungs:  Clear to auscultation bilaterally without wheezes, rales or ronchi; respirations unlabored   Chest Wall:  No tenderness or deformity   Heart:  Regular rate and rhythm, S1 and S2 normal, no murmur, rub  or gallop   Breast Exam:  No tenderness, masses, or nipple discharge or inversion. No axillary lymphadenopathy   Abdomen:  Soft, non-tender, nondistended, normoactive bowel sounds,  no masses, no hepatosplenomegaly   Genitalia:  Normal external genitalia without lesions, some atrophic changes noted. Minimal erythema at labia/introitus. BUS and vagina normal. No abnormal vaginal discharge. Uterus surgically absent and adnexa not palpable--nontender, no masses. Pap not performed   Rectal:  Normal tone, no masses or tenderness; guaiac negative stool   Extremities:  No clubbing, cyanosis or edema   Pulses:  2+ and symmetric all extremities   Skin:  Skin color, texture, turgor normal, no rashes. Actinic changes to skin throughout. +many small ecchymosis on hands/forearms  Lymph nodes:  Cervical, supraclavicular, and axillary nodes normal   Neurologic:  CNII-XII intact, normal strength, sensation and gait; reflexes 2+ and symmetric throughout   Psych: Normal mood, affect, hygiene and grooming.         Chemistry      Component Value Date/Time   NA 133* 06/01/2015 1044   K 4.0 06/01/2015 1044   CL 97 06/01/2015 1044   CO2 26 06/01/2015 1044   BUN 20 07/20/2015 1012   CREATININE 0.80 07/20/2015 1012   CREATININE 0.79 10/11/2011 0345      Component Value  Date/Time   CALCIUM 9.3 06/01/2015 1044   ALKPHOS 53 06/01/2015 1044   AST 18 06/01/2015 1044   ALT 18 06/01/2015 1044   BILITOT 0.6 06/01/2015 1044     Fasting glucose 104 in June  Lab Results  Component Value Date   CHOL 170 06/01/2015   HDL 97 06/01/2015   LDLCALC 63 06/01/2015   TRIG 48 06/01/2015   CHOLHDL 1.8 06/01/2015   ASSESSMENT/PLAN:  Annual physical exam - Plan: Visual acuity screening, POCT Urinalysis Dipstick  Hypothyroidism, unspecified hypothyroidism type - euthyroid by history - Plan: TSH  Coronary artery  disease due to lipid rich plaque - asymptomatic; stable on current regimen  Essential hypertension, benign - elevated in office (high sodium diet yesterday); normal at home. fu if persistently elevated at home - Plan: Comprehensive metabolic panel  Pure hypercholesterolemia - Plan: Comprehensive metabolic panel  OSA on CPAP  Osteoporosis - s/p treatment with bisphophonates; due for DEXA--pt to schedule at [place]: VITAMIN D 25 Hydroxy (Vit-D Deficiency, Fractures)  Renal cell carcinoma, right ([place]) - monitored by M[facility]arty, with cyst on L kidney; doing well - Plan: Comprehensive metabolic panel, CBC with Differential/Platelet  Medication monitoring encounter - Plan: Comprehensive metabolic panel, CBC with Differential/Platelet, VITAMIN D 25 Hydroxy (Vit-D Deficiency, Fractures), TSH  Impaired fasting glucose - Plan: Comprehensive metabolic panel, Hemoglobin A1c  Medicare annual wellness visit, subsequent   Osteoporosis--s/p treatment with bisphosphonate,  Some decline since stopping, and is past due for recheck. Discussed calcium, vitamin D and weight-bearing exercise  H/o leukopenia--check CBC c-met,TSH, A1c ,Vitamin D  Your blood pressure was elevated today. Please periodically check it, and follow up with me or Dr. Debara Mccarty if they remain persistently >520 systolic (goal is <[phone]). Try and follow a low sodium diet, continue the regular  exercise and weight loss. Please also cut back on the alcohol to just one glass of wine each day.  Discussed monthly self breast exams and yearly mammograms; at least 30 minutes of aerobic activity at least 5 days/week, weight-bearing exercise 2x/week; proper sunscreen use reviewed; healthy diet, including goals of calcium and vitamin D intake and alcohol recommendations (less than or equal to 1 drink/day) reviewed; regular seatbelt use; changing batteries in smoke detectors. Immunization recommendations UTD, continue yearly high dose flu shots. Colonoscopy recommendations reviewed, UTD  Please call [facility] to schedule your mammogram and your bone density test (to follow up on your osteoporosis). Please schedule eye exam and appointment with dentist.  Given info on Living Will and [place]; give [place] copy after filled out.  Discussed how to control stress, not let things she can't control bother her (ie her boss's partner and the way he talks to him).   Medicare Attestation I have personally reviewed: The patient's medical and social history Their use of alcohol, tobacco or illicit drugs Their current medications and supplements The patient's functional ability including ADLs,fall risks, home safety risks, cognitive, and hearing and visual impairment Diet and physical activities Evidence for depression or mood disorders  The patient's weight, height,BMI and visual acuity have been recorded in the chart.  I have made referrals, counseling, and provided education to the patient based on review of the above and I have provided the patient with a written personalized care plan for preventive services.     Kentaro Alewine A, MD   11/02/2015

## 2015-11-03 LAB — VITAMIN D 25 HYDROXY (VIT D DEFICIENCY, FRACTURES): VIT D 25 HYDROXY: 49 ng/mL (ref 30–100)

## 2015-11-03 MED ORDER — LEVOTHYROXINE SODIUM 50 MCG PO TABS: ORAL_TABLET | ORAL | Status: DC

## 2015-11-04 DIAGNOSIS — B070 Plantar wart: Secondary | ICD-10-CM | POA: Diagnosis not present

## 2015-11-04 DIAGNOSIS — L570 Actinic keratosis: Secondary | ICD-10-CM | POA: Diagnosis not present

## 2016-04-21 ENCOUNTER — Ambulatory Visit (INDEPENDENT_AMBULATORY_CARE_PROVIDER_SITE_OTHER): Payer: Medicare Other

## 2016-04-21 ENCOUNTER — Ambulatory Visit (INDEPENDENT_AMBULATORY_CARE_PROVIDER_SITE_OTHER): Payer: Medicare Other | Admitting: Family Medicine

## 2016-04-21 VITALS — BP 156/72 | HR 67 | Temp 98.6°F | Resp 18 | Ht 63.0 in | Wt 165.0 lb

## 2016-04-21 DIAGNOSIS — M25571 Pain in right ankle and joints of right foot: Secondary | ICD-10-CM | POA: Diagnosis not present

## 2016-04-21 NOTE — Progress Notes (Signed)
Subjective:    Patient ID: Theresa Mccarty, female    DOB: 1941-08-17, 75 y.o.   MRN: [id number]  HPI This is a 75 yo female who presents with 2.5 months of inner ankle pain. It is more painful and stiff, this improves some during the day then pain comes back as she is on her feet through the day. No medication for pain or inflammation. Has been wearing an Ace wrap which helps. She was never given any type of boot. Wears supportive shoes through the day. Has not been able to walk on the treadmill due to pain. She does ROM exercises (alphabet) prior to getting up in the morning. She has high arches and some shoes bother her more than others.    Past Medical History  Diagnosis Date  . Hypertension   . Osteoporosis   . Back pain, chronic   . Hypothyroid   . CAD (coronary artery disease)   . Impaired fasting glucose   . Sleep apnea     on CPAP  . Broken leg 1958  . Renal cell carcinoma right; 2011    Dr. Alinda Money, [facility]  . Hyperlipidemia   . History of echocardiogram 09/11/2007    normal  . History of nuclear stress test 11/17/2010    exercise; small fixed anteroseptal defect (?artifact); short run of atrial-tachy during recovery; low risk  . Hearing loss     bilateral hearing aids   Past Surgical History  Procedure Laterality Date  . Abdominal hysterectomy  1975    PARTIAL; part of 1 ovary remains  . Spine surgery    . Coronary angioplasty with stent placement  2008    mid LAD  . Kidney surgery  2011    renal carcinoma - cryoablation -  Dr. Alinda Money and Dr. Laural Roes (cryoablation)  . Cardiac catheterization  10/08/2011    LAD-prox stent patent; no significant obstructive CAD; hi LVF end-diastolic pressure   Family History  Problem Relation Age of Onset  . Heart disease Mother   . Hypertension Mother   . Arthritis Mother   . Osteoporosis Mother   . Depression Sister   . Hyperlipidemia Sister   . Heart disease Father   . Diabetes Brother   . Cancer Sister    unsure, related to smoking?  . Cancer Sister     lung cancer  . Hyperlipidemia Brother   . Hypertension Brother    Social History  Substance Use Topics  . Smoking status: Former Smoker    Quit date: 12/19/1968  . Smokeless tobacco: Never Used  . Alcohol Use: 1.2 oz/week    2 Glasses of wine per week     Comment: 2 glasses of wine per day.      Review of Systems Per HPI    Objective:   Physical Exam  Constitutional: She is oriented to person, place, and time. She appears well-developed and well-nourished.  HENT:  Head: Normocephalic and atraumatic.  Eyes: Conjunctivae are normal.  Neck: Normal range of motion. Neck supple.  Cardiovascular: Normal rate.   Pulmonary/Chest: Effort normal.  Musculoskeletal:       Right ankle: She exhibits normal range of motion, no swelling, no ecchymosis, no deformity and normal pulse. Tenderness. Medial malleolus tenderness found. No lateral malleolus, no AITFL, no CF ligament, no posterior TFL, no head of 5th metatarsal and no proximal fibula tenderness found. Achilles tendon exhibits no pain.  Neurological: She is alert and oriented to person, place, and time.  Skin:  Skin is warm and dry.  Psychiatric: She has a normal mood and affect. Her behavior is normal. Judgment and thought content normal.  Vitals reviewed.  BP 156/72 mmHg  Pulse 67  Temp(Src) 98.6 F (37 C)  Resp 18  Ht 5\' 3"  (1.6 m)  Wt 165 lb (74.844 kg)  BMI 29.24 kg/m2  SpO2 96% Dg Ankle Complete Right  04/21/2016  CLINICAL DATA:  History of ankle sprain.  Pain. EXAM: RIGHT ANKLE - COMPLETE 3+ VIEW COMPARISON:  No recent prior. FINDINGS: Diffuse degenerative change. No acute bony or joint abnormality identified. No evidence fracture or dislocation. IMPRESSION: No acute or focal abnormality. Electronically Signed   By: Marcello Moores  Register   On: 04/21/2016 12:24       Assessment & Plan:  Discussed with Dr. Carlota Raspberry 1. Right ankle pain - DG Ankle Complete Right; Future-  discussed findings with patient - Ambulatory referral to Physical Therapy - Acetaminophen and ace wrap for comfort - follow up PRN, consider ortho referral if no improvement with PT  Clarene Reamer, FNP-BC  [facility]  04/21/2016 12:40 PM

## 2016-04-21 NOTE — Patient Instructions (Addendum)
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 Please contact [facility] at [phone] with questions or concerns regarding your invoice.   IF you received labwork today, you will receive an invoice from [facility]. Please contact [facility] at [phone] with questions or concerns regarding your invoice.   Our billing staff will not be able to assist you with questions regarding bills from these companies.  You will be contacted with the lab results as soon as they are available. The fastest way to get your results is to activate your My Chart account. Instructions are located on the last page of this paperwork. If you have not heard from [place] regarding the results in 2 weeks, please contact this office.

## 2016-04-24 NOTE — Progress Notes (Signed)
Patient discussed with Ms. Gessner. Agree with assessment and plan of care per her note.   

## 2016-05-19 ENCOUNTER — Ambulatory Visit (INDEPENDENT_AMBULATORY_CARE_PROVIDER_SITE_OTHER): Payer: Medicare Other | Admitting: Family Medicine

## 2016-05-19 VITALS — BP 128/80 | HR 67 | Temp 97.8°F | Resp 16 | Ht 63.0 in | Wt 165.0 lb

## 2016-05-19 DIAGNOSIS — N3090 Cystitis, unspecified without hematuria: Secondary | ICD-10-CM

## 2016-05-19 DIAGNOSIS — R350 Frequency of micturition: Secondary | ICD-10-CM | POA: Diagnosis not present

## 2016-05-19 LAB — POCT URINALYSIS DIP (MANUAL ENTRY)
BILIRUBIN UA: NEGATIVE
Bilirubin, UA: NEGATIVE
GLUCOSE UA: NEGATIVE
Nitrite, UA: NEGATIVE
SPEC GRAV UA: 1.02
UROBILINOGEN UA: 0.2
pH, UA: 6.5

## 2016-05-19 LAB — POC MICROSCOPIC URINALYSIS (UMFC): Mucus: ABSENT

## 2016-05-19 MED ORDER — CEPHALEXIN 500 MG PO CAPS: 500.0000 mg | ORAL_CAPSULE | Freq: Three times a day (TID) | ORAL | Status: DC

## 2016-05-19 MED ORDER — PHENAZOPYRIDINE HCL 97.2 MG PO TABS: 97.0000 mg | ORAL_TABLET | Freq: Three times a day (TID) | ORAL | Status: DC | PRN

## 2016-05-19 NOTE — Patient Instructions (Signed)
   IF you received an x-ray today, you will receive an invoice from [facility]. Please contact [facility] at [phone] with questions or concerns regarding your invoice.   IF you received labwork today, you will receive an invoice from [facility]. Please contact [facility] at [phone] with questions or concerns regarding your invoice.   Our billing staff will not be able to assist you with questions regarding bills from these companies.  You will be contacted with the lab results as soon as they are available. The fastest way to get your results is to activate your My Chart account. Instructions are located on the last page of this paperwork. If you have not heard from us regarding the results in 2 weeks, please contact this office.     Urinary Tract Infection Urinary tract infections (UTIs) can develop anywhere along your urinary tract. Your urinary tract is your body's drainage system for removing wastes and extra water. Your urinary tract includes two kidneys, two ureters, a bladder, and a urethra. Your kidneys are a pair of bean-shaped organs. Each kidney is about the size of your fist. They are located below your ribs, one on each side of your spine. CAUSES Infections are caused by microbes, which are microscopic organisms, including fungi, viruses, and bacteria. These organisms are so small that they can only be seen through a microscope. Bacteria are the microbes that most commonly cause UTIs. SYMPTOMS  Symptoms of UTIs may vary by age and gender of the patient and by the location of the infection. Symptoms in young women typically include a frequent and intense urge to urinate and a painful, burning feeling in the bladder or urethra during urination. Older women and men are more likely to be tired, shaky, and weak and have muscle aches and abdominal pain. A fever may mean the infection is in your kidneys. Other symptoms of a kidney  infection include pain in your back or sides below the ribs, nausea, and vomiting. DIAGNOSIS To diagnose a UTI, your caregiver will ask you about your symptoms. Your caregiver will also ask you to provide a urine sample. The urine sample will be tested for bacteria and white blood cells. White blood cells are made by your body to help fight infection. TREATMENT  Typically, UTIs can be treated with medication. Because most UTIs are caused by a bacterial infection, they usually can be treated with the use of antibiotics. The choice of antibiotic and length of treatment depend on your symptoms and the type of bacteria causing your infection. HOME CARE INSTRUCTIONS  If you were prescribed antibiotics, take them exactly as your caregiver instructs you. Finish the medication even if you feel better after you have only taken some of the medication.  Drink enough water and fluids to keep your urine clear or pale yellow.  Avoid caffeine, tea, and carbonated beverages. They tend to irritate your bladder.  Empty your bladder often. Avoid holding urine for long periods of time.  Empty your bladder before and after sexual intercourse.  After a bowel movement, women should cleanse from front to back. Use each tissue only once. SEEK MEDICAL CARE IF:   You have back pain.  You develop a fever.  Your symptoms do not begin to resolve within 3 days. SEEK IMMEDIATE MEDICAL CARE IF:   You have severe back pain or lower abdominal pain.  You develop chills.  You have nausea or vomiting.  You have continued burning or discomfort with urination.   MAKE SURE YOU:   Understand these instructions.  Will watch your condition.  Will get help right away if you are not doing well or get worse.   This information is not intended to replace advice given to you by your health care provider. Make sure you discuss any questions you have with your health care provider.   Document Released: 09/14/2005 Document  Revised: 08/26/2015 Document Reviewed: 01/13/2012 Elsevier Interactive Patient Education 2016 [employer]  

## 2016-05-19 NOTE — Progress Notes (Signed)
Subjective:    Patient ID: Theresa Mccarty, female    DOB: 11-18-1941, 75 y.o.   MRN: [id number]  HPI This is a 75 yo female who presents today with dysuria and urinary frequency for 2 days. Has had bladder infection in past, this feels similar. Blood in urine. No fever, no chills, no nausea, no vomiting, no abdominal pain, no flank pain.   Past Medical History  Diagnosis Date  . Hypertension   . Osteoporosis   . Back pain, chronic   . Hypothyroid   . CAD (coronary artery disease)   . Impaired fasting glucose   . Sleep apnea     on CPAP  . Broken leg 1958  . Renal cell carcinoma right; 2011    Dr. Alinda Money, [facility]  . Hyperlipidemia   . History of echocardiogram 09/11/2007    normal  . History of nuclear stress test 11/17/2010    exercise; small fixed anteroseptal defect (?artifact); short run of atrial-tachy during recovery; low risk  . Hearing loss     bilateral hearing aids   Past Surgical History  Procedure Laterality Date  . Abdominal hysterectomy  1975    PARTIAL; part of 1 ovary remains  . Spine surgery    . Coronary angioplasty with stent placement  2008    mid LAD  . Kidney surgery  2011    renal carcinoma - cryoablation -  Dr. Alinda Money and Dr. Laural Roes (cryoablation)  . Cardiac catheterization  10/08/2011    LAD-prox stent patent; no significant obstructive CAD; hi LVF end-diastolic pressure   Family History  Problem Relation Age of Onset  . Heart disease Mother   . Hypertension Mother   . Arthritis Mother   . Osteoporosis Mother   . Depression Sister   . Hyperlipidemia Sister   . Heart disease Father   . Diabetes Brother   . Cancer Sister     unsure, related to smoking?  . Cancer Sister     lung cancer  . Hyperlipidemia Brother   . Hypertension Brother    Social History  Substance Use Topics  . Smoking status: Former Smoker    Quit date: 12/19/1968  . Smokeless tobacco: Never Used  . Alcohol Use: 1.2 oz/week    2 Glasses of wine per week       Comment: 2 glasses of wine per day.      Review of Systems Per HPI    Objective:   Physical Exam  Constitutional: She is oriented to person, place, and time. She appears well-developed and well-nourished. No distress.  Cardiovascular: Normal rate, regular rhythm and normal heart sounds.   Pulmonary/Chest: Effort normal and breath sounds normal.  Abdominal: There is no CVA tenderness.  Neurological: She is alert and oriented to person, place, and time.  Skin: Skin is warm and dry. She is not diaphoretic.  Psychiatric: She has a normal mood and affect. Her behavior is normal. Judgment and thought content normal.  Vitals reviewed.        BP 128/80 mmHg  Pulse 67  Temp(Src) 97.8 F (36.6 C) (Oral)  Resp 16  Ht 5\' 3"  (1.6 m)  Wt 165 lb (74.844 kg)  BMI 29.24 kg/m2  SpO2 98%   Assessment & Plan:     1. Frequent urination - POCT Microscopic Urinalysis ([facility]) - POCT urinalysis dipstick  2. Cystitis - Urine culture - cephALEXin (KEFLEX) 500 MG capsule; Take 1 capsule (500 mg total) by mouth 3 (three) times daily.  Dispense: 21 capsule; Refill: 0 - phenazopyridine (PYRIDIUM) 97 MG tablet; Take 1 tablet (97 mg total) by mouth 3 (three) times daily as needed for pain.  Dispense: 10 tablet; Refill: 0 - Provided written and verbal information regarding diagnosis and treatment. - RTC precautions reviewed  Clarene Reamer, FNP-BC  [facility]  05/19/2016 1:29 PM

## 2016-05-22 LAB — URINE CULTURE: Colony Count: >=100000

## 2016-06-13 ENCOUNTER — Other Ambulatory Visit: Payer: Self-pay

## 2016-06-13 ENCOUNTER — Encounter: Payer: Self-pay | Admitting: Internal Medicine

## 2016-06-13 ENCOUNTER — Ambulatory Visit (INDEPENDENT_AMBULATORY_CARE_PROVIDER_SITE_OTHER): Payer: Medicare Other | Admitting: Internal Medicine

## 2016-06-13 VITALS — BP 120/86 | HR 57 | Ht 63.0 in | Wt 164.0 lb

## 2016-06-13 DIAGNOSIS — G4733 Obstructive sleep apnea (adult) (pediatric): Secondary | ICD-10-CM

## 2016-06-13 DIAGNOSIS — Z0000 Encounter for general adult medical examination without abnormal findings: Secondary | ICD-10-CM | POA: Diagnosis not present

## 2016-06-13 DIAGNOSIS — I2510 Atherosclerotic heart disease of native coronary artery without angina pectoris: Secondary | ICD-10-CM | POA: Diagnosis not present

## 2016-06-13 DIAGNOSIS — E785 Hyperlipidemia, unspecified: Secondary | ICD-10-CM | POA: Diagnosis not present

## 2016-06-13 DIAGNOSIS — Z9989 Dependence on other enabling machines and devices: Secondary | ICD-10-CM

## 2016-06-13 DIAGNOSIS — I10 Essential (primary) hypertension: Secondary | ICD-10-CM | POA: Diagnosis not present

## 2016-06-13 LAB — LIPID PANEL
CHOL/HDL RATIO: 2.2 ratio (ref <=5.0)
CHOLESTEROL: 198 mg/dL (ref 125–200)
HDL: 92 mg/dL (ref >=46)
LDL Cholesterol: 92 mg/dL (ref <130)
TRIGLYCERIDES: 72 mg/dL (ref <150)
VLDL: 14 mg/dL (ref <30)

## 2016-06-13 MED ORDER — NITROGLYCERIN 0.4 MG SL SUBL
0.4000 mg | SUBLINGUAL_TABLET | SUBLINGUAL | Status: DC | PRN
Start: 2016-06-13 — End: 2017-12-22

## 2016-06-13 NOTE — Progress Notes (Signed)
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 Recently, she developed substernal chest pain in the fall and underwent cardiac catheterization. This demonstrated patent LAD stent that was proximal as well as 30% disease of the LAD just prior to that stent, but no other areas of obstructive coronary disease. She continued to have chest pain; however, I felt it was most likely related to stress. I have also encouraged dietary changes as well as trying to reduce her stress and alcohol intake. She has accomplished that to some extent and, over the past 6 months has had no other symptoms of chest discomfort. She occasionally gets short of breath with heavy exertion and has some swelling in her legs only when standing up for long periods of time. She continues to work at [employer] although there is some stress she is actually maintaining her exercise regimen. This is clearly demonstrated improvement in her risk factors including a reduction in cholesterol. A recent lipid NMR demonstrated a particle number of 631 and an LDL cholesterol of 81.   Theresa Mccarty returns for annual followup today. She reports feeling very well. She recently had the shingles vaccine at [facility]. She had some questions today about starting coconut oil glucosamine/chondroitin which I think are fine. She did have one episode of discomfort in her chest after working in regard for which she took nitroglycerin however she noted no improvement in her symptoms. She is very interested in decreasing her Lipitor she does report some soreness in her arm muscles as well as her leg muscles. She believes this is related to the statin. She has not been on any other statin since her stent was placed in 2008. She is interested a recheck of her  cholesterol today as well as a screening hemoglobin A1c - she is nondiabetic.  Finally, she recently had a fall at home which was due to tripping over a table leg. She did have some soreness in her right hand as well as ecchymosis and swelling of her digits and 2 bandages on her right toes.  Theresa Mccarty has no specific complaints today. She denies any chest pain or worsening shortness of breath. Blood pressure is at goal. She continues to take aspirin and Plavix without any bleeding complications. Cholesterol is due for recheck. She does report some mild calf tenderness which she attributes to a atorvastatin.  06/13/2016  Theresa Mccarty returns today for follow-up. Over the past year she's done fairly well. She's had some struggles with arthritis. She noted that she was having some side effects related to atorvastatin including weakness in her arm which improved after discontinuing the medicine. She says she's had about 8 pound weight loss however we demonstrated only 1-2 pounds in the office. She is currently on red yeast rice and wishes to have a fasting lipid profile today. She denies any significant chest discomfort or worsening shortness of breath. She recently retired from her job and notes less stress in her life. Blood pressure is well-controlled today.  PMHx:  Past Medical History  Diagnosis Date  . Hypertension   . Osteoporosis   . Back pain, chronic   . Hypothyroid   . CAD (coronary artery disease)   . Impaired fasting glucose   . Sleep apnea     on CPAP  .  Broken leg 1958  . Renal cell carcinoma right; 2011    Dr. Alinda Money, [facility]  . Hyperlipidemia   . History of echocardiogram 09/11/2007    normal  . History of nuclear stress test 11/17/2010    exercise; small fixed anteroseptal defect (?artifact); short run of atrial-tachy during recovery; low risk  . Hearing loss     bilateral hearing aids    Past Surgical History  Procedure Laterality Date  . Abdominal hysterectomy  1975      PARTIAL; part of 1 ovary remains  . Spine surgery    . Coronary angioplasty with stent placement  2008    mid LAD  . Kidney surgery  2011    renal carcinoma - cryoablation -  Dr. Alinda Money and Dr. Laural Roes (cryoablation)  . Cardiac catheterization  10/08/2011    LAD-prox stent patent; no significant obstructive CAD; hi LVF end-diastolic pressure    FAMHx:  Family History  Problem Relation Age of Onset  . Heart disease Mother   . Hypertension Mother   . Arthritis Mother   . Osteoporosis Mother   . Depression Sister   . Hyperlipidemia Sister   . Heart disease Father   . Diabetes Brother   . Cancer Sister     unsure, related to smoking?  . Cancer Sister     lung cancer  . Hyperlipidemia Brother   . Hypertension Brother     SOCHx:   reports that she quit smoking about 47 years ago. She has never used smokeless tobacco. She reports that she drinks about 1.2 oz of alcohol per week. She reports that she does not use illicit drugs.  ALLERGIES:  No Known Allergies  ROS: A comprehensive review of systems was negative.  HOME MEDS: Current Outpatient Prescriptions  Medication Sig Dispense Refill  . amLODipine (NORVASC) 5 MG tablet TAKE 1 TABLET BY MOUTH EVERY DAY 90 tablet 3  . aspirin 81 MG tablet Take 81 mg by mouth daily.      . B Complex Vitamins (B-COMPLEX/B-12 PO) Take 1 tablet by mouth daily.      . Calcium-Magnesium-Zinc 500-250-12.5 MG TABS Take 1 tablet by mouth daily.    . Cholecalciferol (VITAMIN D) 1000 UNITS capsule Take 1,000 Units by mouth daily.    . clopidogrel (PLAVIX) 75 MG tablet TAKE 1 TABLET (75 MG TOTAL) BY MOUTH DAILY. 90 tablet 3  . Coenzyme Q10 (COQ10) 100 MG CAPS Take 1 tablet by mouth.    . levothyroxine (SYNTHROID, LEVOTHROID) 50 MCG tablet TAKE 1 TABLET (50 MCG TOTAL) BY MOUTH DAILY. 90 tablet 3  . nitroGLYCERIN (NITROSTAT) 0.4 MG SL tablet Place 1 tablet (0.4 mg total) under the tongue every 5 (five) minutes as needed for chest pain. 25 tablet 0   . phenazopyridine (PYRIDIUM) 97 MG tablet Take 1 tablet (97 mg total) by mouth 3 (three) times daily as needed for pain. 10 tablet 0  . Probiotic Product (PROBIOTIC PO) Take 1 capsule by mouth daily.    . valsartan-hydrochlorothiazide (DIOVAN-HCT) 320-25 MG tablet TAKE 1 TABLET BY MOUTH DAILY. 90 tablet 2   No current facility-administered medications for this visit.    LABS/IMAGING: No results found for this or any previous visit (from the past 48 hour(s)). No results found.  VITALS: BP 120/86 mmHg  Pulse 57  Ht 5\' 3"  (1.6 m)  Wt 164 lb (74.39 kg)  BMI 29.06 kg/m2  SpO2 98%  EXAM: General appearance: alert and no distress Neck: no adenopathy, no carotid  bruit, no JVD, supple, symmetrical, trachea midline and thyroid not enlarged, symmetric, no tenderness/mass/nodules Lungs: clear to auscultation bilaterally Heart: regular rate and rhythm, S1, S2 normal, no murmur, click, rub or gallop Abdomen: soft, non-tender; bowel sounds normal; no masses,  no organomegaly Extremities: Right hand ecchymosis, digit swelling, right toes are bandaged Pulses: 2+ and symmetric Skin: Skin color, texture, turgor normal. No rashes or lesions Neurologic: Grossly normal  EKG: Sinus bradycardia 57  ASSESSMENT: 1. Coronary artery disease status post PCI of the proximal LAD in 2008 2. Hypertension - controlled 3. Hyperlipidemia - controlled 4. Obstructive sleep apnea on CPAP 5. Arthritis  PLAN: 1.   Mrs. Orand is doing very well. She recently quit her job of past 30 years and notes an improvement in stress and blood pressure. She's now committed to doing some more exercise. Blood pressure is well controlled. Her cholesterol is due for recheck as she's been off of the atorvastatin due to side effects. She is currently on red yeast rice. If her LDL is not at goal, she may benefit from the addition of ezetimibe.  Follow-up with me annually.  [name], MD, [facility] Attending Cardiologist [place] 06/13/2016, 8:58 AM

## 2016-06-13 NOTE — Patient Instructions (Signed)
Medication Instructions:  Your physician recommends that you continue on your current medications as directed. Please refer to the Current Medication list given to you today.   Labwork: Your physician recommends that you return for lab work TODAY- FASTING.    Follow-Up: Your physician wants you to follow-up in: 12 MONTHS WITH DR HILTY. You will receive a reminder letter in the mail two months in advance. If you don't receive a letter, please call our office to schedule the follow-up appointment.   Any Other Special Instructions Will Be Listed Below (If Applicable).     If you need a refill on your cardiac medications before your next appointment, please call your pharmacy.

## 2016-06-20 ENCOUNTER — Other Ambulatory Visit: Payer: Self-pay | Admitting: Internal Medicine

## 2016-06-20 MED ORDER — CLOPIDOGREL BISULFATE 75 MG PO TABS: 75.0000 mg | ORAL_TABLET | Freq: Every day | ORAL | Status: DC

## 2016-06-20 MED ORDER — VALSARTAN-HYDROCHLOROTHIAZIDE 320-25 MG PO TABS: 1.0000 | ORAL_TABLET | Freq: Every day | ORAL | Status: DC

## 2016-06-20 NOTE — Telephone Encounter (Signed)
Rx request sent to pharmacy.  

## 2016-06-22 ENCOUNTER — Other Ambulatory Visit: Payer: Self-pay

## 2016-06-22 MED ORDER — AMLODIPINE BESYLATE 5 MG PO TABS: 5.0000 mg | ORAL_TABLET | Freq: Every day | ORAL | Status: DC

## 2016-06-22 NOTE — Telephone Encounter (Signed)
Rx Refill

## 2016-06-23 ENCOUNTER — Other Ambulatory Visit: Payer: Self-pay | Admitting: Internal Medicine

## 2016-07-11 DIAGNOSIS — H2513 Age-related nuclear cataract, bilateral: Secondary | ICD-10-CM | POA: Diagnosis not present

## 2016-07-11 DIAGNOSIS — H524 Presbyopia: Secondary | ICD-10-CM | POA: Diagnosis not present

## 2016-07-11 DIAGNOSIS — H5213 Myopia, bilateral: Secondary | ICD-10-CM | POA: Diagnosis not present

## 2016-07-19 DIAGNOSIS — M25571 Pain in right ankle and joints of right foot: Secondary | ICD-10-CM | POA: Diagnosis not present

## 2016-07-26 ENCOUNTER — Other Ambulatory Visit (HOSPITAL_COMMUNITY): Payer: Self-pay | Admitting: Interventional Radiology

## 2016-07-26 DIAGNOSIS — N289 Disorder of kidney and ureter, unspecified: Secondary | ICD-10-CM

## 2016-07-26 DIAGNOSIS — C641 Malignant neoplasm of right kidney, except renal pelvis: Secondary | ICD-10-CM

## 2016-07-27 DIAGNOSIS — M25571 Pain in right ankle and joints of right foot: Secondary | ICD-10-CM | POA: Diagnosis not present

## 2016-07-28 ENCOUNTER — Other Ambulatory Visit: Payer: Self-pay | Admitting: *Deleted

## 2016-07-28 DIAGNOSIS — C641 Malignant neoplasm of right kidney, except renal pelvis: Secondary | ICD-10-CM

## 2016-07-28 DIAGNOSIS — N289 Disorder of kidney and ureter, unspecified: Secondary | ICD-10-CM

## 2016-07-29 DIAGNOSIS — M25571 Pain in right ankle and joints of right foot: Secondary | ICD-10-CM | POA: Diagnosis not present

## 2016-08-02 DIAGNOSIS — M25571 Pain in right ankle and joints of right foot: Secondary | ICD-10-CM | POA: Diagnosis not present

## 2016-08-03 ENCOUNTER — Other Ambulatory Visit (HOSPITAL_COMMUNITY): Payer: Self-pay | Admitting: Urology

## 2016-08-03 ENCOUNTER — Ambulatory Visit (HOSPITAL_COMMUNITY)
Admission: RE | Admit: 2016-08-03 | Discharge: 2016-08-03 | Disposition: A | Discharge status: Home / Self Care | Payer: Medicare Other | Source: Ambulatory Visit | Attending: Urology | Admitting: Urology

## 2016-08-03 DIAGNOSIS — N2889 Other specified disorders of kidney and ureter: Secondary | ICD-10-CM | POA: Diagnosis not present

## 2016-08-03 DIAGNOSIS — N289 Disorder of kidney and ureter, unspecified: Secondary | ICD-10-CM | POA: Diagnosis not present

## 2016-08-03 LAB — CREATININE WITH EST GFR
Creat: 0.67 mg/dL (ref 0.60–0.93)
GFR, EST NON AFRICAN AMERICAN: 87 mL/min (ref >=60)
GFR, Est African American: >89 mL/min (ref >=60)

## 2016-08-03 LAB — BUN: BUN: 16 mg/dL (ref 7–25)

## 2016-08-04 DIAGNOSIS — M25571 Pain in right ankle and joints of right foot: Secondary | ICD-10-CM | POA: Diagnosis not present

## 2016-08-09 DIAGNOSIS — M25571 Pain in right ankle and joints of right foot: Secondary | ICD-10-CM | POA: Diagnosis not present

## 2016-08-10 DIAGNOSIS — C641 Malignant neoplasm of right kidney, except renal pelvis: Secondary | ICD-10-CM | POA: Diagnosis not present

## 2016-08-11 DIAGNOSIS — M25571 Pain in right ankle and joints of right foot: Secondary | ICD-10-CM | POA: Diagnosis not present

## 2016-08-18 ENCOUNTER — Other Ambulatory Visit (INDEPENDENT_AMBULATORY_CARE_PROVIDER_SITE_OTHER): Payer: Medicare Other

## 2016-08-18 ENCOUNTER — Ambulatory Visit (HOSPITAL_COMMUNITY)
Admission: RE | Admit: 2016-08-18 | Discharge: 2016-08-18 | Disposition: A | Discharge status: Home / Self Care | Payer: Medicare Other | Source: Ambulatory Visit | Attending: Interventional Radiology | Admitting: Interventional Radiology

## 2016-08-18 ENCOUNTER — Ambulatory Visit
Admission: RE | Admit: 2016-08-18 | Discharge: 2016-08-18 | Disposition: A | Discharge status: Home / Self Care | Payer: Medicare Other | Source: Ambulatory Visit | Attending: Interventional Radiology | Admitting: Interventional Radiology

## 2016-08-18 DIAGNOSIS — C641 Malignant neoplasm of right kidney, except renal pelvis: Secondary | ICD-10-CM

## 2016-08-18 DIAGNOSIS — Z9889 Other specified postprocedural states: Secondary | ICD-10-CM | POA: Insufficient documentation

## 2016-08-18 DIAGNOSIS — K862 Cyst of pancreas: Secondary | ICD-10-CM | POA: Insufficient documentation

## 2016-08-18 DIAGNOSIS — Z23 Encounter for immunization: Secondary | ICD-10-CM | POA: Diagnosis not present

## 2016-08-18 DIAGNOSIS — N281 Cyst of kidney, acquired: Secondary | ICD-10-CM | POA: Insufficient documentation

## 2016-08-18 DIAGNOSIS — K760 Fatty (change of) liver, not elsewhere classified: Secondary | ICD-10-CM | POA: Diagnosis not present

## 2016-08-18 DIAGNOSIS — N289 Disorder of kidney and ureter, unspecified: Secondary | ICD-10-CM

## 2016-08-18 DIAGNOSIS — K7689 Other specified diseases of liver: Secondary | ICD-10-CM | POA: Insufficient documentation

## 2016-08-18 DIAGNOSIS — N2889 Other specified disorders of kidney and ureter: Secondary | ICD-10-CM | POA: Diagnosis not present

## 2016-08-18 HISTORY — PX: IR GENERIC HISTORICAL: IMG1180011

## 2016-08-18 MED ORDER — GADOBENATE DIMEGLUMINE 529 MG/ML IV SOLN
15.0000 mL | Freq: Once | INTRAVENOUS | Status: AC | PRN
  Administered 2016-08-18: 15 mL via INTRAVENOUS

## 2016-08-18 NOTE — Progress Notes (Signed)
Referring Physician(s): Dr. [name] Dr. Raynelle Bring  Supervising Physician: [name]  Patient Status:  Outpatient   Subjective: Follow up of Theresa Mccarty who underwent successful cryoablation of right renal lesion in 2011. She has done very well over the years and her surveillance imaging has been stable. She had another MRI this am.   Allergies: Atorvastatin and Celebrex [celecoxib]  Medications: Prior to Admission medications   Medication Sig Start Date End Date Taking? Authorizing Provider  amLODipine (NORVASC) 5 MG tablet Take 1 tablet (5 mg total) by mouth daily. 06/22/16  Yes [name], MD  aspirin 81 MG tablet Take 81 mg by mouth daily.     Yes Historical Provider, MD  B Complex Vitamins (B-COMPLEX/B-12 PO) Take 1 tablet by mouth daily.     Yes Historical Provider, MD  Calcium-Magnesium-Zinc 500-250-12.5 MG TABS Take 1 tablet by mouth daily.   Yes Historical Provider, MD  Cholecalciferol (VITAMIN D) 1000 UNITS capsule Take 1,000 Units by mouth daily.   Yes Historical Provider, MD  clopidogrel (PLAVIX) 75 MG tablet Take 1 tablet (75 mg total) by mouth daily. 06/20/16  Yes [name], MD  Coenzyme Q10 (COQ10) 100 MG CAPS Take 1 tablet by mouth.   Yes Historical Provider, MD  nitroGLYCERIN (NITROSTAT) 0.4 MG SL tablet Place 1 tablet (0.4 mg total) under the tongue every 5 (five) minutes as needed for chest pain. 06/13/16  Yes [name], MD  Probiotic Product (PROBIOTIC PO) Take 1 capsule by mouth daily.   Yes Historical Provider, MD  Red Yeast Rice Extract (RED YEAST RICE PO) Take by mouth daily.   Yes Historical Provider, MD  valsartan-hydrochlorothiazide (DIOVAN-HCT) 320-25 MG tablet Take 1 tablet by mouth daily. 06/20/16  Yes [name], MD  levothyroxine (SYNTHROID, LEVOTHROID) 50 MCG tablet TAKE 1 TABLET (50 MCG TOTAL) BY MOUTH DAILY. Patient not taking: Reported on 08/18/2016 11/03/15   [name], MD  phenazopyridine (PYRIDIUM) 97 MG tablet Take  1 tablet (97 mg total) by mouth 3 (three) times daily as needed for pain. 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 Followup indeterminate left renal lesion. EXAM: MRI ABDOMEN WITHOUT AND WITH CONTRAST TECHNIQUE: Multiplanar multisequence MR imaging of the abdomen was performed both before and after the administration of intravenous contrast. CONTRAST:  58mL MULTIHANCE GADOBENATE DIMEGLUMINE 529 MG/ML IV SOLN COMPARISON:  CT on 07/28/2015 and MRI on 04/29/2014 FINDINGS: Lower chest:  No acute findings. Hepatobiliary: Diffuse hepatic steatosis again demonstrated. Small hepatic cysts are again seen in both the right and left hepatic lobes, however no liver masses are identified. Gallbladder is unremarkable. No evidence of biliary ductal dilatation. Pancreas: Several tiny pancreatic cysts are again seen in the body and tail which are stable since CT in 2013, suggesting benign etiology. Largest adjacent to the pancreatic tail measures 13 mm on image 16/4. No evidence of solid pancreatic mass or pancreatic ductal dilatation. Spleen:  Within normal limits in size and appearance. Adrenals/Urinary Tract: No adrenal masses identified. Cryoablation defect in upper pole of right kidney appears stable. No evidence of recurrent mass at this site. Multiple small Bosniak category 1 and 2 renal cysts are again seen bilaterally. 14 mm subcapsular lesion in midpole of left kidney is again seen on images 31/6 and 51/903. This shows T2  hyperintensity and no evidence of contrast enhancement on subtraction imaging, consistent with a benign cyst. No enhancing or solid renal masses are identified. No evidence hydronephrosis. Stomach/Bowel: Visualized portions within the abdomen are unremarkable. Vascular/Lymphatic: No pathologically enlarged lymph nodes identified. No abdominal aortic aneurysm demonstrated. Other:  None. Musculoskeletal:  No suspicious bone lesions identified. IMPRESSION: Stable post cryoablation changes in upper pole of right kidney. No evidence of locally recurrent or metastatic carcinoma. Bilateral benign Bosniak category 1 and 2 renal cysts. No other solid or enhancing renal masses identified. Stable hepatic steatosis and small hepatic cysts. Stable tiny cysts in pancreatic body and tail, largest measuring 1.3 cm, which are likely benign. Recommend continued attention on follow-up imaging in 1-2 years. This recommendation follows ACR consensus guidelines: Management of Incidental Pancreatic Cysts: A White Paper of the ACR Incidental Findings Committee. [place] [id number]. Electronically Signed   By: Earle Gell M.D.   On: 08/18/2016 10:31    Labs:  CBC:  Recent Labs  11/02/15 0001  WBC 4.3  HGB 13.0  HCT 39.2  PLT 261    COAGS: No results for input(s): INR, APTT in the last 8760 hours.  BMP:  Recent Labs  11/02/15 0001 08/03/16 0908  NA 133*  --   K 3.8  --   CL 96*  --   CO2 29  --   GLUCOSE 97  --   BUN 16 16  CALCIUM 9.4  --   CREATININE 0.57* 0.67  GFRNONAA  --  87  GFRAA  --  >89    LIVER FUNCTION TESTS:  Recent Labs  11/02/15 0001  BILITOT 0.6  AST 17  ALT 16  ALKPHOS 54  PROT 6.4  ALBUMIN 4.1    Assessment and Plan: Hx of right renal cryoablation in 2011-stable by imaging, no evidence of recurrence of metastatic disease. Stable renal and pancreatic cysts, recommend MRI in 2 years.  Electronically Signed: Ascencion Dike 08/18/2016, 1:16 PM   I spent a  total of 15 Minutes at the the patient's bedside AND on the patient's hospital floor or unit, greater than 50% of which was counseling/coordinating care for right renal cryoablation

## 2016-08-19 ENCOUNTER — Other Ambulatory Visit: Payer: Medicare Other

## 2016-10-31 ENCOUNTER — Other Ambulatory Visit: Payer: Self-pay | Admitting: Family Medicine

## 2016-10-31 DIAGNOSIS — E039 Hypothyroidism, unspecified: Secondary | ICD-10-CM

## 2016-11-16 DIAGNOSIS — G4733 Obstructive sleep apnea (adult) (pediatric): Secondary | ICD-10-CM | POA: Diagnosis not present

## 2016-11-27 NOTE — Progress Notes (Signed)
Chief Complaint  Patient presents with  . Medicare Wellness    fasting AWV/CPE with pelvic. Did not do eye exam just saw Dr.Shapiro. No concerns today.     Theresa Mccarty is a 75 y.o. female who presents for annual physical, Medicare wellness visit and follow-up on chronic medical conditions.   Hypothyroidism: Compliant with taking thyroid medications on an empty stomach, separate from other medications. She feels well, denies any thyroid-related symptoms. No hair/skin/bowel/mood/energy/weight changes.  H/o Renal Cell Cancer, treated in 07/2010 with cryoablation.  She last saw Dr. Alinda Money in August, and had f/u MRI which didn't show any recurrence or metastases. IMPRESSION: Stable post cryoablation changes in upper pole of right kidney. No evidence of locally recurrent or metastatic carcinoma. Bilateral benign Bosniak category 1 and 2 renal cysts. No other solid or enhancing renal masses identified. Stable hepatic steatosis and small hepatic cysts. Stable tiny cysts in pancreatic body and tail, largest measuring 1.3 cm, which are likely benign. Recommend continued attention on follow-up imaging in 1-2 years.   Hypertension follow-up: Blood pressures have only been checked once or twice since seeing [name] in June.  120-low 130's/70. She retired, so has much less stress.  Denies dizziness, headaches, chest pain. Denies side effects of medications. She hasn't been exercising as much since having problems with right ankle pain.  She saw Dr. Carlota Raspberry at [facility] with right ankle pain.  Diagnosed with a tendon dysfunction, and is 95% better after 6 weeks of PT.  She had limited exercise during this time (starting in May).  CAD: Sees [name], last seen in June. CAD, s/p PCI of proximal LAD in 2008. [name] also manages her lipids. She was having trouble tolerating atorvastatin due to calf pain, and ultimately she changed to red yeast rice.  She had lipids checked by him on the RYR,  and was felt to be acceptable. Lab Results  Component Value Date   CHOL 198 06/13/2016   HDL 92 06/13/2016   LDLCALC 92 06/13/2016   TRIG 72 06/13/2016   CHOLHDL 2.2 06/13/2016   OSA--on CPAP; reports compliance. Dr. Claiborne Billings used to manage her CPAP, hasn't seen him in 3 years.  She now needs new supplies so plans to contact him. Tolerating CPAP well.  Osteoporosis: Previously treated with fosamax and Boniva. She has been off meds for at least 6-7 years. She doesn't recall why the medication was stopped, denies side effects (made her tired/draggy?). Her DEXA in 2009 was while still taking Boniva, and she had another DEXA 10/2012 which showed T-2.4 and decline in bone density at the left hip.  It doesn't appear that she had the f/u DEXA as has been recommended at her last physicals. Her Vitamin D level was too high in the past, she has since cut back her dose to 1000 IU daily.   Immunization History  Administered Date(s) Administered  . Influenza, High Dose Seasonal PF 10/16/2013, 12/01/2014, 09/24/2015, 08/18/2016  . Pneumococcal Conjugate-13 08/06/2014  . Pneumococcal Polysaccharide-23 08/09/2012  . Td 01/04/2006  . Tdap 08/09/2012  . Zoster 02/16/2014   Last Pap smear: 2009; S/p hysterectomy in '75  Last mammogram: 12/2011 (did not get one last year, as recommended) Last colonoscopy: 07/26/11  Last DEXA: 10/2012 at [place]. T-2.4 at L femur, with significant decline at L hip since 2009 DEXA Dentist: past due, many years. Ophtho: went recently. Cataracts found bilaterally. Got glasses for driving. Exercise: since ankle pain resolved wit PT, she is back on treadmill  35 minutes at least 5x/week; 2.5 mph. No weight-bearing exercise.  Other doctors caring for patient include: Dr. Alinda Money (urology) and Dr. Kathlene Cote [name] (cardiology) Dr. Claiborne Billings for OSA (hasn't seen in a while) Derm at [facility]. Dr. [name] (ophtho) [facility] for hearing (had hearing aids) Doesn't have a  dentist  Depression screen: negative Fall screen: slipped on the wood kitchen floor the other evening. No injury (feet went out from under her). Functional Status survey:  Negative--see screen in epic. +hearing aid  End of Life Discussion:  Patient does not have a living will and medical power of attorney (info given last year, but she reports she lost it)  Past Medical History:  Diagnosis Date  . Back pain, chronic   . Broken leg 1958  . CAD (coronary artery disease)   . Hearing loss    bilateral hearing aids  . History of echocardiogram 09/11/2007   normal  . History of nuclear stress test 11/17/2010   exercise; small fixed anteroseptal defect (?artifact); short run of atrial-tachy during recovery; low risk  . Hyperlipidemia   . Hypertension   . Hypothyroid   . Impaired fasting glucose   . Osteoporosis   . Renal cell carcinoma right; 2011   Dr. Alinda Money, [facility]  . Sleep apnea    on CPAP    Past Surgical History:  Procedure Laterality Date  . ABDOMINAL HYSTERECTOMY  1975   PARTIAL; part of 1 ovary remains  . CARDIAC CATHETERIZATION  10/08/2011   LAD-prox stent patent; no significant obstructive CAD; hi LVF end-diastolic pressure  . CORONARY ANGIOPLASTY WITH STENT PLACEMENT  2008   mid LAD  . KIDNEY SURGERY  2011   renal carcinoma - cryoablation -  Dr. Alinda Money and Dr. Laural Roes (cryoablation)  . SPINE SURGERY      Social History   Social History  . Marital status: Married    Spouse name: N/A  . Number of children: 2  . Years of education: N/A   Occupational History  . retired Psychiatric nurse [employer]   Social History Main Topics  . Smoking status: Former Smoker    Quit date: 12/19/1968  . Smokeless tobacco: Never Used  . Alcohol use 1.2 oz/week    2 Glasses of wine per week     Comment: 2 glasses of wine per day.  . Drug use: No  . Sexual activity: Not Currently   Other Topics Concern  . Not on file   Social History Narrative   Lives with  husband.  2 sons live together, near [place]. Retired from working for [employer] 02/2016.   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    Family History  Problem Relation Age of Onset  . Heart disease Mother   . Hypertension Mother   . Arthritis Mother   . Osteoporosis Mother   . Depression Sister   . Hyperlipidemia Sister   . Heart disease Father   . Diabetes Brother   . Cancer Sister     unsure, related to smoking?  . Cancer Sister     lung cancer  . Hyperlipidemia Brother   . Hypertension Brother     Outpatient Encounter Prescriptions as of 11/28/2016  Medication Sig  . amLODipine (NORVASC) 5 MG tablet Take 1 tablet (5 mg total) by mouth daily.  [name] aspirin 81 MG tablet Take 81 mg by mouth daily.    . B Complex Vitamins (B-COMPLEX/B-12 PO) Take  1 tablet by mouth daily.    . Calcium-Magnesium-Zinc 500-250-12.5 MG TABS Take 1 tablet by mouth daily.  . Cholecalciferol (VITAMIN D) 1000 UNITS capsule Take 1,000 Units by mouth daily.  . clopidogrel (PLAVIX) 75 MG tablet Take 1 tablet (75 mg total) by mouth daily.  . Coenzyme Q10 (COQ10) 100 MG CAPS Take 1 tablet by mouth.  . levothyroxine (SYNTHROID, LEVOTHROID) 50 MCG tablet TAKE 1 TABLET (50 MCG TOTAL) BY MOUTH DAILY.  . Probiotic Product (PROBIOTIC PO) Take 1 capsule by mouth daily.  . Red Yeast Rice Extract (RED YEAST RICE PO) Take by mouth daily.  . valsartan-hydrochlorothiazide (DIOVAN-HCT) 320-25 MG tablet Take 1 tablet by mouth daily.  . nitroGLYCERIN (NITROSTAT) 0.4 MG SL tablet Place 1 tablet (0.4 mg total) under the tongue every 5 (five) minutes as needed for chest pain. (Patient not taking: Reported on 11/28/2016)  . [DISCONTINUED] phenazopyridine (PYRIDIUM) 97 MG tablet Take 1 tablet (97 mg total) by mouth 3 (three) times daily as needed for pain. (Patient not taking: Reported on 08/18/2016)   No facility-administered encounter medications on file as of 11/28/2016.      Allergies  Allergen Reactions  . Atorvastatin Other (See Comments)    Muscle aches  . Celebrex [Celecoxib] Other (See Comments)    Muscle aches     ROS:  The patient denies anorexia, fever, headaches, vision changes, ear pain, sore throat, breast concerns, chest pain, palpitations, dizziness, syncope, dyspnea on exertion, cough, swelling, nausea, vomiting, diarrhea, constipation, abdominal pain, melena, hematochezia, indigestion/heartburn, hematuria, incontinence, dysuria, vaginal bleeding, discharge, odor or itch, genital lesions, new joint pains (mild at knees, shoulder, sometimes left hip (bursitis) and back pain), numbness, tingling, weakness, tremor, suspicious skin lesions, depression, anxiety, abnormal bleeding or enlarged lymph nodes. +easy bruising (unchanged) +hearing loss -- has hearing aids. Slight vaginal itch along the hairline, externally; denies discharge. Only bothersome at night. She wears cotton underwear and uses baby powder, or vaseline, which both help. This is less often now than last year. Mild allergies with weather changes, no significant symptoms currently. Rare constipation (better since on probiotic). SOB only if long uphill on a walk, or if puts treadmill faster or at an incline. Gas and abdominal pain 2 days ago, resolved on its own. Lots of gas bubbles; denies any change in diet.  PHYSICAL EXAM:  BP (!) 178/88 (BP Location: Left Arm, Patient Position: Sitting, Cuff Size: Normal)   Pulse 60   Ht '5\' 2"'  (1.575 m)   Wt 163 lb (73.9 kg)   BMI 29.81 kg/m   154/74 on repeat by MD  General Appearance:  Alert, cooperative, no distress, appears stated age   Head:  Normocephalic, without obvious abnormality, atraumatic   Eyes:  PERRL, conjunctiva/corneas clear, EOM's intact, fundi  Benign. Mild ptosis of upper lid on the right. Mild cataracts L>R  Ears:  Not examined; bilateral hearing aids in place  Nose:  Nares normal, mucosa normal, no  drainage or sinus tenderness   Throat:  Lips, mucosa, and tongue normal; teeth and gums normal   Neck:  Supple, no lymphadenopathy; thyroid: no enlargement/tenderness/nodules; no carotid bruit or JVD   Back:  Spine nontender, no curvature, ROM normal, no CVA tenderness   Lungs:  Clear to auscultation bilaterally without wheezes, rales or ronchi; respirations unlabored   Chest Wall:  No tenderness or deformity   Heart:  Regular rate and rhythm, S1 and S2 normal, no murmur, rub  or gallop   Breast Exam:  No tenderness,  masses, or nipple discharge or inversion. No axillary lymphadenopathy   Abdomen:  Soft, non-tender, nondistended, normoactive bowel sounds,  no masses, no hepatosplenomegaly   Genitalia:  Normal external genitalia without lesions, some atrophic changes noted. BUS and vagina normal. No abnormal vaginal discharge. Uterus surgically absent and adnexa not palpable--nontender, no masses. Pap not performed   Rectal:  Normal tone, no masses or tenderness; guaiac negative stool   Extremities:  No clubbing, cyanosis or edema   Pulses:  2+ and symmetric all extremities   Skin:  Skin color, texture, turgor normal, no rashes. Actinic changes to skin throughout. +many small ecchymosis on hands/forearms, with larger bruising and small hematoma noted on lower portion of right upper arm (related to recent fall)  Lymph nodes:  Cervical, supraclavicular, and axillary nodes normal   Neurologic:  CNII-XII intact, normal strength, sensation and gait; reflexes 2+ and symmetric throughout                         Psych: Normal mood, affect, hygiene and grooming   ASSESSMENT/PLAN:  Annual physical exam - Plan: POCT Urinalysis Dipstick, Comprehensive metabolic panel, Lipid panel, CBC with Differential/Platelet, TSH, Hemoglobin A1c  Medicare annual wellness visit, subsequent  Essential hypertension, benign - elevated today; discussed low sodium diet; monitor at home and  f/u if persistently elevated.  - Plan: Comprehensive metabolic panel  Pure hypercholesterolemia - recheck lipids; didn't tolerate statin, currently doing well on red yeast rice - Plan: Lipid panel  Osteoporosis, unspecified osteoporosis type, unspecified pathological fracture presence - past due for recheck; encouraged to schedule DEXA and mammogram, which is also past due - Plan: TSH  OSA on CPAP  Impaired fasting glucose - Plan: Hemoglobin A1c  Coronary artery disease due to lipid rich plaque - asymptomatic, stable; under the care of [name] - Plan: Comprehensive metabolic panel, CBC with Differential/Platelet, TSH  Other fatigue   Vit D normal at 49 last year (down from 91 when on higher doses 3 years ago).  c-met, lipids, A1c, TSH, CBC  Discussed monthly self breast exams and yearly mammograms (past due--reasons for getting discussed at length, in addition to BSE); at least 30 minutes of aerobic activity at least 5 days/week, weight-bearing exercise 2x/week; proper sunscreen use reviewed; healthy diet, including goals of calcium and vitamin D intake and alcohol recommendations (less than or equal to 1 drink/day) reviewed; regular seatbelt use; changing batteries in smoke detectors. Immunization recommendations UTD, continue yearly high dose flu shots. Consider Shingrix when available. Colonoscopy recommendations reviewed, UTD  Osteoporosis--s/p treatment with bisphosphonate,  Some decline since stopping, and is past due for recheck. Advised to schedule DEXA and mammogram at [facility] (where she previously had) Discussed calcium, vitamin D and weight-bearing exercise in detail  Living will, healthcare POA info again given, and discussed  Cut back on alcohol to just 1 glass of wine/alcoholic beverage daily  Elevated BP-- Low sodium diet, check more frequently at home.  F/u here or with [name] if BP's are consistently elevated.  Full Code, Full care  Medicare Attestation I have  personally reviewed: The patient's medical and social history Their use of alcohol, tobacco or illicit drugs Their current medications and supplements The patient's functional ability including ADLs,fall risks, home safety risks, cognitive, and hearing and visual impairment Diet and physical activities Evidence for depression or mood disorders  The patient's weight, height, BMI, and visual acuity have been recorded in the chart.  I have made referrals, counseling,  and provided education to the patient based on review of the above and I have provided the patient with a written personalized care plan for preventive services.     Curtina Grills A, MD   11/27/2016

## 2016-11-28 ENCOUNTER — Encounter: Payer: Self-pay | Admitting: Family Medicine

## 2016-11-28 ENCOUNTER — Ambulatory Visit (INDEPENDENT_AMBULATORY_CARE_PROVIDER_SITE_OTHER): Payer: Medicare Other | Admitting: Family Medicine

## 2016-11-28 VITALS — BP 154/74 | HR 60 | Ht 62.0 in | Wt 163.0 lb

## 2016-11-28 DIAGNOSIS — G4733 Obstructive sleep apnea (adult) (pediatric): Secondary | ICD-10-CM | POA: Diagnosis not present

## 2016-11-28 DIAGNOSIS — I2583 Coronary atherosclerosis due to lipid rich plaque: Secondary | ICD-10-CM | POA: Diagnosis not present

## 2016-11-28 DIAGNOSIS — E7800 Pure hypercholesterolemia, unspecified: Secondary | ICD-10-CM | POA: Diagnosis not present

## 2016-11-28 DIAGNOSIS — R7301 Impaired fasting glucose: Secondary | ICD-10-CM | POA: Diagnosis not present

## 2016-11-28 DIAGNOSIS — Z9989 Dependence on other enabling machines and devices: Secondary | ICD-10-CM | POA: Diagnosis not present

## 2016-11-28 DIAGNOSIS — R5383 Other fatigue: Secondary | ICD-10-CM

## 2016-11-28 DIAGNOSIS — I2510 Atherosclerotic heart disease of native coronary artery without angina pectoris: Secondary | ICD-10-CM

## 2016-11-28 DIAGNOSIS — I10 Essential (primary) hypertension: Secondary | ICD-10-CM

## 2016-11-28 DIAGNOSIS — M810 Age-related osteoporosis without current pathological fracture: Secondary | ICD-10-CM

## 2016-11-28 DIAGNOSIS — Z0000 Encounter for general adult medical examination without abnormal findings: Secondary | ICD-10-CM | POA: Diagnosis not present

## 2016-11-28 LAB — CBC WITH DIFFERENTIAL/PLATELET
BASOS ABS: 0 {cells}/uL (ref 0–200)
Basophils Relative: 0 %
EOS ABS: 108 {cells}/uL (ref 15–500)
EOS PCT: 2 %
HCT: 39 % (ref 35.0–45.0)
HEMOGLOBIN: 13.6 g/dL (ref 11.7–15.5)
LYMPHS ABS: 1026 {cells}/uL (ref 850–3900)
Lymphocytes Relative: 19 %
MCH: 33.4 pg — AB (ref 27.0–33.0)
MCHC: 34.9 g/dL (ref 32.0–36.0)
MCV: 95.8 fL (ref 80.0–100.0)
MPV: 10.2 fL (ref 7.5–12.5)
Monocytes Absolute: 540 cells/uL (ref 200–950)
Monocytes Relative: 10 %
NEUTROS PCT: 69 %
Neutro Abs: 3726 cells/uL (ref 1500–7800)
Platelets: 268 10*3/uL (ref 140–400)
RBC: 4.07 MIL/uL (ref 3.80–5.10)
RDW: 13.1 % (ref 11.0–15.0)
WBC: 5.4 10*3/uL (ref 4.0–10.5)

## 2016-11-28 LAB — POCT URINALYSIS DIPSTICK
BILIRUBIN UA: NEGATIVE
Blood, UA: NEGATIVE
Glucose, UA: NEGATIVE
KETONES UA: NEGATIVE
LEUKOCYTES UA: NEGATIVE
NITRITE UA: NEGATIVE
Spec Grav, UA: 1.01
Urobilinogen, UA: NEGATIVE
pH, UA: 8

## 2016-11-28 NOTE — Patient Instructions (Addendum)
HEALTH MAINTENANCE RECOMMENDATIONS:  It is recommended that you get at least 30 minutes of aerobic exercise at least 5 days/week (for weight loss, you may need as much as 60-90 minutes). This can be any activity that gets your heart rate up. This can be divided in 10-15 minute intervals if needed, but try and build up your endurance at least once a week.  Weight bearing exercise is also recommended twice weekly.  Eat a healthy diet with lots of vegetables, fruits and fiber.  "Colorful" foods have a lot of vitamins (ie green vegetables, tomatoes, red peppers, etc).  Limit sweet tea, regular sodas and alcoholic beverages, all of which has a lot of calories and sugar.  Up to 1 alcoholic drink daily may be beneficial for women (unless trying to lose weight, watch sugars).  Drink a lot of water.  Calcium recommendations are 1200-1500 mg daily (1500 mg for postmenopausal women or women without ovaries), and vitamin D 1000 IU daily.  This should be obtained from diet and/or supplements (vitamins), and calcium should not be taken all at once, but in divided doses.  Monthly self breast exams and yearly mammograms for women over the age of 74 is recommended.  Sunscreen of at least SPF 30 should be used on all sun-exposed parts of the skin when outside between the hours of 10 am and 4 pm (not just when at beach or pool, but even with exercise, golf, tennis, and yard work!)  Use a sunscreen that says "broad spectrum" so it covers both UVA and UVB rays, and make sure to reapply every 1-2 hours.  Remember to change the batteries in your smoke detectors when changing your clock times in the spring and fall.  Use your seat belt every time you are in a car, and please drive safely and not be distracted with cell phones and texting while driving.   Ms. Uher , Thank you for taking time to come for your Medicare Wellness Visit. I appreciate your ongoing commitment to your health goals. Please review the following  plan we discussed and let me know if I can assist you in the future.   These are the goals we discussed: Goals    None      This is a list of the screening recommended for you and due dates:  Health Maintenance  Topic Date Due  . Colon Cancer Screening  07/25/2021  . Tetanus Vaccine  08/09/2022  . Flu Shot  Completed  . DEXA scan (bone density measurement)  Completed  . Shingles Vaccine  Completed  . Pneumonia vaccines  Completed   Continue yearly high dose flu shots Your are past due for follow-up bone density test and for mammogram (I recommend yearly mammograms). Please call [facility] to schedule both tests.  They will need an order for the bone density--they can fax me a request. There is a new shingles vaccine that is better than the zostavax that you had.  It is called Shingrix.  It is not yet available.  In the next few months, check with your insurance to see if it is covered, and whether you need to get it from the pharmacy (if covered by Medicare Part D) or from our office. It is a series of 2 shots, given 2 months apart.  Please add in weight-bearing exercise at least 2-3 times/week. Continue your 150 minutes or more of cardio each week. Please cut back on the wine to just 5 ounces daily. I'd like to see  you lose inches from your waist (and lose weight).  Elevated BP today. Low sodium diet (see below), check BP more frequently at home.  Follow up here or with [name] if BP's are consistently elevated.   Heart-Healthy Eating Plan Introduction Heart-healthy meal planning includes:  Limiting unhealthy fats.  Increasing healthy fats.  Making other small dietary changes. You may need to talk with your doctor or a diet specialist (dietitian) to create an eating plan that is right for you. What types of fat should I choose?  Choose healthy fats. These include olive oil and canola oil, flaxseeds, walnuts, almonds, and seeds.  Eat more omega-3 fats. These include salmon,  mackerel, sardines, tuna, flaxseed oil, and ground flaxseeds. Try to eat fish at least twice each week.  Limit saturated fats.  Saturated fats are often found in animal products, such as meats, butter, and cream.  Plant sources of saturated fats include palm oil, palm kernel oil, and coconut oil.  Avoid foods with partially hydrogenated oils in them. These include stick margarine, some tub margarines, cookies, crackers, and other baked goods. These contain trans fats. What general guidelines do I need to follow?  Check food labels carefully. Identify foods with trans fats or high amounts of saturated fat.  Fill one half of your plate with vegetables and green salads. Eat 4-5 servings of vegetables per day. A serving of vegetables is:  1 cup of raw leafy vegetables.   cup of raw or cooked cut-up vegetables.   cup of vegetable juice.  Fill one fourth of your plate with whole grains. Look for the word "whole" as the first word in the ingredient list.  Fill one fourth of your plate with lean protein foods.  Eat 4-5 servings of fruit per day. A serving of fruit is:  One medium whole fruit.   cup of dried fruit.   cup of fresh, frozen, or canned fruit.   cup of 100% fruit juice.  Eat more foods that contain soluble fiber. These include apples, broccoli, carrots, beans, peas, and barley. Try to get 20-30 g of fiber per day.  Eat more home-cooked food. Eat less restaurant, buffet, and fast food.  Limit or avoid alcohol.  Limit foods high in starch and sugar.  Avoid fried foods.  Avoid frying your food. Try baking, boiling, grilling, or broiling it instead. You can also reduce fat by:  Removing the skin from poultry.  Removing all visible fats from meats.  Skimming the fat off of stews, soups, and gravies before serving them.  Steaming vegetables in water or broth.  Lose weight if you are overweight.  Eat 4-5 servings of nuts, legumes, and seeds per week:  One  serving of dried beans or legumes equals  cup after being cooked.  One serving of nuts equals 1 ounces.  One serving of seeds equals  ounce or one tablespoon.  You may need to keep track of how much salt or sodium you eat. This is especially true if you have high blood pressure. Talk with your doctor or dietitian to get more information. What foods can I eat? Grains  Breads, including [place], white, pita, wheat, raisin, rye, oatmeal, and [place]. Tortillas that are neither fried nor made with lard or trans fat. Low-fat rolls, including hotdog and hamburger buns and English muffins. Biscuits. Muffins. Waffles. Pancakes. Light popcorn. Whole-grain cereals. Flatbread. Melba toast. Pretzels. Breadsticks. Rusks. Low-fat snacks. Low-fat crackers, including oyster, saltine, matzo, graham, animal, and rye. Rice and pasta,  including brown rice and pastas that are made with whole wheat. Vegetables  All vegetables. Fruits  All fruits, but limit coconut. Meats and Other Protein Sources  Lean, well-trimmed beef, veal, pork, and lamb. Chicken and [place] without skin. All fish and shellfish. Wild duck, rabbit, pheasant, and venison. Egg whites or low-cholesterol egg substitutes. Dried beans, peas, lentils, and tofu. Seeds and most nuts. Dairy  Low-fat or nonfat cheeses, including ricotta, string, and mozzarella. Skim or 1% milk that is liquid, powdered, or evaporated. Buttermilk that is made with low-fat milk. Nonfat or low-fat yogurt. Beverages  Mineral water. Diet carbonated beverages. Sweets and Desserts  Sherbets and fruit ices. Honey, jam, marmalade, jelly, and syrups. Meringues and gelatins. Pure sugar candy, such as hard candy, jelly beans, gumdrops, mints, marshmallows, and small amounts of dark chocolate. [employer] Eat all sweets and desserts in moderation. Fats and Oils  Nonhydrogenated (trans-free) margarines. Vegetable oils, including soybean, sesame, sunflower, olive, peanut,  safflower, corn, canola, and cottonseed. Salad dressings or mayonnaise made with a vegetable oil. Limit added fats and oils that you use for cooking, baking, salads, and as spreads. Other  Cocoa powder. Coffee and tea. All seasonings and condiments. The items listed above may not be a complete list of recommended foods or beverages. Contact your dietitian for more options.  What foods are not recommended? Grains  Breads that are made with saturated or trans fats, oils, or whole milk. Croissants. Butter rolls. Cheese breads. Sweet rolls. Donuts. Buttered popcorn. Chow mein noodles. High-fat crackers, such as cheese or butter crackers. Meats and Other Protein Sources  Fatty meats, such as hotdogs, short ribs, sausage, spareribs, bacon, rib eye roast or steak, and mutton. High-fat deli meats, such as salami and bologna. Caviar. Domestic duck and goose. Organ meats, such as kidney, liver, sweetbreads, and heart. Dairy  Cream, sour cream, cream cheese, and creamed cottage cheese. Whole-milk cheeses, including blue (bleu), Monterey Jack, [place], [place], American, [place], Swiss, cheddar, [place], and [place]. Whole or 2% milk that is liquid, evaporated, or condensed. Whole buttermilk. Cream sauce or high-fat cheese sauce. Yogurt that is made from whole milk. Beverages  Regular sodas and juice drinks with added sugar. Sweets and Desserts  Frosting. Pudding. Cookies. Cakes other than angel food cake. Candy that has milk chocolate or white chocolate, hydrogenated fat, butter, coconut, or unknown ingredients. Buttered syrups. Full-fat ice cream or ice cream drinks. Fats and Oils  Gravy that has suet, meat fat, or shortening. Cocoa butter, hydrogenated oils, palm oil, coconut oil, palm kernel oil. These can often be found in baked products, candy, fried foods, nondairy creamers, and whipped toppings. Solid fats and shortenings, including bacon fat, salt pork, lard, and butter. Nondairy cream substitutes, such  as coffee creamers and sour cream substitutes. Salad dressings that are made of unknown oils, cheese, or sour cream. The items listed above may not be a complete list of foods and beverages to avoid. Contact your dietitian for more information.  This information is not intended to replace advice given to you by your health care provider. Make sure you discuss any questions you have with your health care provider. Document Released: 06/05/2012 Document Revised: 05/12/2016 Document Reviewed: 05/29/2014  2017 Elsevier

## 2016-11-29 LAB — LIPID PANEL
CHOL/HDL RATIO: 1.9 ratio (ref <5.0)
CHOLESTEROL: 211 mg/dL — AB (ref <200)
HDL: 110 mg/dL (ref >50)
LDL Cholesterol: 90 mg/dL (ref <100)
Triglycerides: 54 mg/dL (ref <150)
VLDL: 11 mg/dL (ref <30)

## 2016-11-29 LAB — COMPREHENSIVE METABOLIC PANEL
ALK PHOS: 60 U/L (ref 33–130)
ALT: 14 U/L (ref 6–29)
AST: 18 U/L (ref 10–35)
Albumin: 4.4 g/dL (ref 3.6–5.1)
BILIRUBIN TOTAL: 0.9 mg/dL (ref 0.2–1.2)
BUN: 9 mg/dL (ref 7–25)
CALCIUM: 9.6 mg/dL (ref 8.6–10.4)
CO2: 27 mmol/L (ref 20–31)
Chloride: 92 mmol/L — ABNORMAL LOW (ref 98–110)
Creat: 0.63 mg/dL (ref 0.60–0.93)
Glucose, Bld: 90 mg/dL (ref 65–99)
POTASSIUM: 4.1 mmol/L (ref 3.5–5.3)
Sodium: 129 mmol/L — ABNORMAL LOW (ref 135–146)
TOTAL PROTEIN: 6.6 g/dL (ref 6.1–8.1)

## 2016-11-29 LAB — TSH: TSH: 2.49 mIU/L

## 2016-11-29 LAB — HEMOGLOBIN A1C
Hgb A1c MFr Bld: 4.6 % (ref <5.7)
Mean Plasma Glucose: 85 mg/dL

## 2016-12-22 ENCOUNTER — Encounter: Payer: Self-pay | Admitting: Interventional Radiology

## 2016-12-26 DIAGNOSIS — N3000 Acute cystitis without hematuria: Secondary | ICD-10-CM | POA: Diagnosis not present

## 2017-01-26 ENCOUNTER — Other Ambulatory Visit: Payer: Self-pay | Admitting: Family Medicine

## 2017-01-26 DIAGNOSIS — E039 Hypothyroidism, unspecified: Secondary | ICD-10-CM

## 2017-03-13 ENCOUNTER — Telehealth: Payer: Self-pay | Admitting: Internal Medicine

## 2017-03-13 NOTE — Telephone Encounter (Signed)
Left msg to call.

## 2017-03-13 NOTE — Telephone Encounter (Signed)
New message    Patient c/o Palpitations:  High priority if patient c/o lightheadedness and shortness of breath.  1. How long have you been having palpitations? A couple of weeks  2. Are you currently experiencing lightheadedness and shortness of breath? Pt states she is having sob  3. Have you checked your BP and heart rate? (document readings) 115/87 p-100, 130/73 p-105, 91/69 p-89, 119/81 p-96 141/80 p-94  4. Are you experiencing any other symptoms? some chest pressure and fatigue  Pt scheduled appt with Bernerd Pho 03/21/17

## 2017-03-13 NOTE — Telephone Encounter (Signed)
Theresa Mccarty is returning a call . Thanks

## 2017-03-13 NOTE — Telephone Encounter (Signed)
Sounds good - thanks.  Dr Lemmie Evens

## 2017-03-13 NOTE — Telephone Encounter (Signed)
Spoke to patient.   C/o palpitations, which she describes as "gurgling" sensation like liquid bubbling in a drain.  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 States she's had a little discomfort in her chest, but nothing severe.   States she recently used a TENS machine for the 1st time in 2 years. Since then she's noted some palpitations, along with mild SOB.  HR has been up - 90s-100s, BPs as recorded.  Also noted a high reading of 160/100 which occurred after a missed dose of her BP med.  Sched to see Lurena Joiner 2pm Thursday. Advised patient to call if change in symptoms/new concerns. She voiced understanding and thanks.

## 2017-03-16 ENCOUNTER — Encounter: Payer: Self-pay | Admitting: Cardiology

## 2017-03-16 ENCOUNTER — Ambulatory Visit (INDEPENDENT_AMBULATORY_CARE_PROVIDER_SITE_OTHER): Payer: Medicare Other | Admitting: Cardiology

## 2017-03-16 VITALS — BP 145/91 | HR 115 | Ht 63.0 in | Wt 164.6 lb

## 2017-03-16 DIAGNOSIS — Z9861 Coronary angioplasty status: Secondary | ICD-10-CM | POA: Diagnosis not present

## 2017-03-16 DIAGNOSIS — G4733 Obstructive sleep apnea (adult) (pediatric): Secondary | ICD-10-CM

## 2017-03-16 DIAGNOSIS — I4891 Unspecified atrial fibrillation: Secondary | ICD-10-CM

## 2017-03-16 DIAGNOSIS — I4819 Other persistent atrial fibrillation: Secondary | ICD-10-CM | POA: Insufficient documentation

## 2017-03-16 DIAGNOSIS — Z9989 Dependence on other enabling machines and devices: Secondary | ICD-10-CM

## 2017-03-16 DIAGNOSIS — I2510 Atherosclerotic heart disease of native coronary artery without angina pectoris: Secondary | ICD-10-CM | POA: Diagnosis not present

## 2017-03-16 DIAGNOSIS — I10 Essential (primary) hypertension: Secondary | ICD-10-CM | POA: Diagnosis not present

## 2017-03-16 MED ORDER — DILTIAZEM HCL ER COATED BEADS 180 MG PO CP24: 180.0000 mg | ORAL_CAPSULE | Freq: Every day | ORAL | 6 refills | Status: DC

## 2017-03-16 MED ORDER — APIXABAN 5 MG PO TABS: 5.0000 mg | ORAL_TABLET | Freq: Two times a day (BID) | ORAL | 6 refills | Status: DC

## 2017-03-16 NOTE — Assessment & Plan Note (Signed)
I suspect she has been in AF for the past 2-3 weeks- complaining of tachycardia and dyspnea

## 2017-03-16 NOTE — Assessment & Plan Note (Signed)
Compliant 

## 2017-03-16 NOTE — Assessment & Plan Note (Signed)
LAD DES 2008, Patent at cath 2012

## 2017-03-16 NOTE — Assessment & Plan Note (Signed)
Statin intolerance, last LDL 90 Dec 2017

## 2017-03-16 NOTE — Progress Notes (Signed)
03/16/2017 Theresa Mccarty   [birth year]  [id number]  Primary Physician [name] A, MD Primary Cardiologist: Dr Debara Pickett  HPI:  Pleasant 76 y/o female with a history of CAD, s/p remote LAD DES 2008, this was patent at cath in 2012. She has sleep apnea-on C-pap, HTN,and HLD-statin intolerant. She had been doing well till a few weeks ago when she developed back pain at work (she works as a Psychiatric nurse). She placed a TENs unit on and ever since has noted tachycardia and DOE. In the office today she is in AF with VR 115. She denies any angina.    Current Outpatient Prescriptions  Medication Sig Dispense Refill  . aspirin 81 MG tablet Take 81 mg by mouth daily.      . B Complex Vitamins (B-COMPLEX/B-12 PO) Take 1 tablet by mouth daily.      . Calcium-Magnesium-Zinc 500-250-12.5 MG TABS Take 1 tablet by mouth daily.    . Cholecalciferol (VITAMIN D) 2000 units tablet Take 2,000 Units by mouth daily.    . Coenzyme Q10 (COQ10) 100 MG CAPS Take 1 tablet by mouth.    . levothyroxine (SYNTHROID, LEVOTHROID) 50 MCG tablet TAKE 1 TABLET (50 MCG TOTAL) BY MOUTH DAILY. 90 tablet 2  . nitroGLYCERIN (NITROSTAT) 0.4 MG SL tablet Place 1 tablet (0.4 mg total) under the tongue every 5 (five) minutes as needed for chest pain. 25 tablet 3  . Probiotic Product (PROBIOTIC PO) Take 1 capsule by mouth daily.    . Red Yeast Rice Extract (RED YEAST RICE PO) Take by mouth daily.    . valsartan-hydrochlorothiazide (DIOVAN-HCT) 320-25 MG tablet Take 1 tablet by mouth daily. 90 tablet 3  . apixaban (ELIQUIS) 5 MG TABS tablet Take 1 tablet (5 mg total) by mouth 2 (two) times daily. 60 tablet 6  . Cholecalciferol (VITAMIN D) 1000 UNITS capsule Take 1,000 Units by mouth daily.    [name] diltiazem (CARDIZEM CD) 180 MG 24 hr capsule Take 1 capsule (180 mg total) by mouth daily. 30 capsule 6   No current facility-administered medications for this visit.     Allergies  Allergen Reactions  . Nitrofurantoin Nausea Only and Rash  .  Atorvastatin Other (See Comments)    Muscle aches  . Celebrex [Celecoxib] Other (See Comments)    Muscle aches    Past Medical History:  Diagnosis Date  . Back pain, chronic   . Broken leg 1958  . CAD (coronary artery disease)   . Hearing loss    bilateral hearing aids  . History of echocardiogram 09/11/2007   normal  . History of nuclear stress test 11/17/2010   exercise; small fixed anteroseptal defect (?artifact); short run of atrial-tachy during recovery; low risk  . Hyperlipidemia   . Hypertension   . Hypothyroid   . Impaired fasting glucose   . Osteoporosis   . Renal cell carcinoma right; 2011   Dr. Alinda Money, [facility]  . Sleep apnea    on CPAP    Social History   Social History  . Marital status: Married    Spouse name: N/A  . Number of children: 2  . Years of education: N/A   Occupational History  . retired Psychiatric nurse [employer]   Social History Main Topics  . Smoking status: Former Smoker    Quit date: 12/19/1968  . Smokeless tobacco: Never Used  . Alcohol use 1.2 oz/week    2 Glasses of wine per week     Comment: 2 glasses  of wine per day.  . Drug use: No  . Sexual activity: Not Currently   Other Topics Concern  . Not on file   Social History Narrative   Lives with husband.  2 sons live together, near [place]. Retired from working for [employer] 02/2016.   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     Family History  Problem Relation Age of Onset  . Heart disease Mother   . Hypertension Mother   . Arthritis Mother   . Osteoporosis Mother   . Depression Sister   . Hyperlipidemia Sister   . Heart disease Father   . Diabetes Brother   . Cancer Sister     unsure, related to smoking?  . Cancer Sister     lung cancer  . Hyperlipidemia Brother   . Hypertension Brother      Review of Systems: General: negative for chills, fever, night sweats or weight changes.  Cardiovascular:  negative for chest pain, dyspnea on exertion, edema, orthopnea, palpitations, paroxysmal nocturnal dyspnea or shortness of breath Dermatological: negative for rash Respiratory: negative for cough or wheezing Urologic: negative for hematuria Abdominal: negative for nausea, vomiting, diarrhea, bright red blood per rectum, melena, or hematemesis Neurologic: negative for visual changes, syncope, or dizziness All other systems reviewed and are otherwise negative except as noted above.    Blood pressure (!) 145/91, pulse (!) 115, height 5\' 3"  (1.6 m), weight 164 lb 9.6 oz (74.7 kg).  General appearance: alert, cooperative, no distress and mildly obese Neck: no carotid bruit, no JVD and thyroid not enlarged, symmetric, no tenderness/mass/nodules Lungs: clear to auscultation bilaterally and kyphosis Heart: irregularly irregular rhythm Extremities: no edema Skin: Skin color, texture, turgor normal. No rashes or lesions Neurologic: Grossly normal  EKG AF with VR 115- NSST changes, septal Q, TWI V3-V6  ASSESSMENT AND PLAN:   Atrial fibrillation, new onset ([place]) I suspect she has been in AF for the past 2-3 weeks- complaining of tachycardia and dyspnea  CAD S/P percutaneous coronary angioplasty LAD DES 2008, Patent at cath 2012  OSA on CPAP Compliant  Essential hypertension, benign Controlled  Dyslipidemia Statin intolerance, last LDL 90 Dec 2017   PLAN  Pt is a CHAD2s VASC 5. Stop Plavix, add Eliquis 5 mg BID. Is in the past her EKGs show she is bradycardic at baseline. That is probably why she is not on a beta blocker. I'l change her Norvasc to Diltiazem 180. She should have an OV next week to check her rate and then a f/u with Dr Debara Pickett in 4 weeks to consider OP DCCV. In addition to changing her medications I ordered an echo and labs.  Kerin Ransom PA-C 03/16/2017 2:40 PM

## 2017-03-16 NOTE — Assessment & Plan Note (Signed)
Controlled.  

## 2017-03-16 NOTE — Patient Instructions (Addendum)
Medication Instructions:  STOP Norcasc STOP Plavix START Eliquis 5 mg Take 1 tablet by mouth twice a day  START Cardizem 180 mg Take 1 tablet once a day   Labwork: Your physician recommends that you return for lab work in: TODAY--TSH, BMP, MAGNESIUM  Testing/Procedures: Your physician has requested that you have an echocardiogram. Echocardiography is a painless test that uses sound waves to create images of your heart. It provides your doctor with information about the size and shape of your heart and how well your heart's chambers and valves are working. This procedure takes approximately one hour. There are no restrictions for this procedure.  [address] [place]  Follow-Up: Your physician recommends that you schedule a follow-up appointment in: [place], [place] physician recommends that you schedule a follow-up appointment in: 1 MONTH WITH DR HILTY  Any Other Special Instructions Will Be Listed Below (If Applicable).  If you need a refill on your cardiac medications before your next appointment, please call your pharmacy.

## 2017-03-17 LAB — MAGNESIUM: Magnesium: 2 mg/dL (ref 1.6–2.3)

## 2017-03-17 LAB — BASIC METABOLIC PANEL
BUN/Creatinine Ratio: 18 (ref 12–28)
BUN: 12 mg/dL (ref 8–27)
CO2: 23 mmol/L (ref 18–29)
Calcium: 9.4 mg/dL (ref 8.7–10.3)
Chloride: 89 mmol/L — ABNORMAL LOW (ref 96–106)
Creatinine, Ser: 0.65 mg/dL (ref 0.57–1.00)
GFR calc Af Amer: 100 mL/min/{1.73_m2} (ref >59)
GFR calc non Af Amer: 87 mL/min/{1.73_m2} (ref >59)
Glucose: 99 mg/dL (ref 65–99)
Potassium: 3.9 mmol/L (ref 3.5–5.2)
Sodium: 130 mmol/L — ABNORMAL LOW (ref 134–144)

## 2017-03-17 LAB — TSH: TSH: 2.46 u[IU]/mL (ref 0.450–4.500)

## 2017-03-17 NOTE — Addendum Note (Signed)
Addended by: Milderd Meager on: 03/17/2017 04:18 PM   Modules accepted: Orders

## 2017-03-21 ENCOUNTER — Ambulatory Visit: Payer: Medicare Other | Admitting: Student

## 2017-03-22 NOTE — Progress Notes (Signed)
Cardiology Office Note    Date:  03/23/2017   ID:  EDEE NIFONG, DOB November 01, 1941, MRN [id number]  PCP:  Vikki Ports, MD  Cardiologist: [name]  Chief Complaint  Patient presents with  . Follow-up    1 week follow up, decrease in shortness of breath and rapid heart beats     History of Present Illness:    Theresa Mccarty is a 76 y.o. female with past medical history of CAD (s/p DES to LAD in 2008, patent by cath in 2012), OSA (on CPAP), HTN, HLD, and newly diagnosed atrial fibrillation who presents to the office today for follow-up.   She was seen by Kerin Ransom, PA-C on 03/16/2017 for evaluation of new-onset palpitations which occurred after using a TENS unit for her back pain. EKG during her office visit showed new-onset atrial fibrillation with HR of 115. Plavix was discontinued and she was started on Eliquis 5mg  BID. Amlodipine was discontinued and switched to Cardizem CD 180mg  daily. TSH, Mg, and K+ were within normal limits. An echocardiogram was ordered and is scheduled for 04/06/2017.  In talking with the patient today, she reports significant improvement in her symptoms in the past week. She denies any recent chest discomfort or palpitations. Has experienced dyspnea on exertion for the past several years but reports improvement in this over the past week as well.  She reports good compliance with her medication regimen including Eliquis. She denies any evidence of melena, hematochezia, or hematuria. She was unsure whether or not to continue taking ASA along with this, but discontinued it upon starting Eliquis.  She has not checked her blood pressure regularly, but denies any evidence of lightheadedness, dizziness, headaches, or presyncope.  Past Medical History:  Diagnosis Date  . Atrial fibrillation ([place])    a. initially diagnosed in 02/2017. Started on Eliquis  . Back pain, chronic   . Broken leg 1958  . CAD (coronary artery disease)    a. s/p DES to LAD in 2008, patent  by cath in 2012.  [name] Hearing loss    bilateral hearing aids  . History of echocardiogram 09/11/2007   normal  . History of nuclear stress test 11/17/2010   exercise; small fixed anteroseptal defect (?artifact); short run of atrial-tachy during recovery; low risk  . Hyperlipidemia   . Hypertension   . Hypothyroid   . Impaired fasting glucose   . Osteoporosis   . Renal cell carcinoma right; 2011   Dr. Alinda Money, [facility]  . Sleep apnea    on CPAP    Past Surgical History:  Procedure Laterality Date  . ABDOMINAL HYSTERECTOMY  1975   PARTIAL; part of 1 ovary remains  . CARDIAC CATHETERIZATION  10/08/2011   LAD-prox stent patent; no significant obstructive CAD; hi LVF end-diastolic pressure  . CORONARY ANGIOPLASTY WITH STENT PLACEMENT  2008   mid LAD  . IR GENERIC HISTORICAL  08/18/2016   IR RADIOLOGIST EVAL & MGMT 08/18/2016 [name], MD [facility] INTERV RAD  . KIDNEY SURGERY  2011   renal carcinoma - cryoablation -  Dr. Alinda Money and Dr. Laural Roes (cryoablation)  . SPINE SURGERY      Current Medications: Outpatient Medications Prior to Visit  Medication Sig Dispense Refill  . apixaban (ELIQUIS) 5 MG TABS tablet Take 1 tablet (5 mg total) by mouth 2 (two) times daily. 60 tablet 6  . B Complex Vitamins (B-COMPLEX/B-12 PO) Take 1 tablet by mouth daily.      . Calcium-Magnesium-Zinc 500-250-12.5 MG  TABS Take 1 tablet by mouth daily.    . Cholecalciferol (VITAMIN D) 2000 units tablet Take 2,000 Units by mouth daily.    . Coenzyme Q10 (COQ10) 100 MG CAPS Take 1 tablet by mouth.    . diltiazem (CARDIZEM CD) 180 MG 24 hr capsule Take 1 capsule (180 mg total) by mouth daily. 30 capsule 6  . levothyroxine (SYNTHROID, LEVOTHROID) 50 MCG tablet TAKE 1 TABLET (50 MCG TOTAL) BY MOUTH DAILY. 90 tablet 2  . nitroGLYCERIN (NITROSTAT) 0.4 MG SL tablet Place 1 tablet (0.4 mg total) under the tongue every 5 (five) minutes as needed for chest pain. 25 tablet 3  . Probiotic Product (PROBIOTIC PO)  Take 1 capsule by mouth daily.    . Red Yeast Rice Extract (RED YEAST RICE PO) Take by mouth daily.    . valsartan-hydrochlorothiazide (DIOVAN-HCT) 320-25 MG tablet Take 1 tablet by mouth daily. 90 tablet 3  . aspirin 81 MG tablet Take 81 mg by mouth daily.      . Cholecalciferol (VITAMIN D) 1000 UNITS capsule Take 1,000 Units by mouth daily.     No facility-administered medications prior to visit.      Allergies:   Nitrofurantoin; Atorvastatin; and Celebrex [celecoxib]   Social History   Social History  . Marital status: Married    Spouse name: N/A  . Number of children: 2  . Years of education: N/A   Occupational History  . retired Psychiatric nurse [employer]   Social History Main Topics  . Smoking status: Former Smoker    Quit date: 12/19/1968  . Smokeless tobacco: Never Used  . Alcohol use 1.2 oz/week    2 Glasses of wine per week     Comment: 2 glasses of wine per day.  . Drug use: No  . Sexual activity: Not Currently   Other Topics Concern  . None   Social History Narrative   Lives with husband.  2 sons live together, near [place]. Retired from working for [employer] 02/2016.   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     Family History:  The patient's family history includes Arthritis in her mother; Cancer in her sister and sister; Depression in her sister; Diabetes in her brother; Heart disease in her father and mother; Hyperlipidemia in her brother and sister; Hypertension in her brother and mother; Osteoporosis in her mother.   Review of Systems:   Please see the history of present illness.     General:  No chills, fever, night sweats or weight changes.  Cardiovascular:  No chest pain, edema, orthopnea, palpitations, paroxysmal nocturnal dyspnea. Positive for dyspnea on exertion.  Dermatological: No rash, lesions/masses Respiratory: No cough, dyspnea Urologic: No hematuria, dysuria Abdominal:   No nausea,  vomiting, diarrhea, bright red blood per rectum, melena, or hematemesis Neurologic:  No visual changes, wkns, changes in mental status. All other systems reviewed and are otherwise negative except as noted above.   Physical Exam:    VS:  BP 132/86   Pulse 90   Ht 5\' 3"  (1.6 m)   Wt 164 lb (74.4 kg)   BMI 29.05 kg/m    General: Well developed, well nourished Caucasian female appearing in no acute distress. Head: Normocephalic, atraumatic, sclera non-icteric, no xanthomas, nares are without discharge.  Neck: No carotid bruits. JVD not elevated.  Lungs: Respirations regular and unlabored, without wheezes or rales.  Heart: Irregularly irregular. No S3 or S4.  No murmur, no rubs, or gallops appreciated. Abdomen: Soft, non-tender, non-distended with normoactive bowel sounds. No hepatomegaly. No rebound/guarding. No obvious abdominal masses. Msk:  Strength and tone appear normal for age. No joint deformities or effusions. Extremities: No clubbing or cyanosis. No lower extremity edema.  Distal pedal pulses are 2+ bilaterally. Neuro: Alert and oriented X 3. Moves all extremities spontaneously. No focal deficits noted. Psych:  Responds to questions appropriately with a normal affect. Skin: No rashes or lesions noted  Wt Readings from Last 3 Encounters:  03/23/17 164 lb (74.4 kg)  03/16/17 164 lb 9.6 oz (74.7 kg)  11/28/16 163 lb (73.9 kg)     Studies/Labs Reviewed:   EKG:  EKG is ordered today. The ekg ordered today demonstrates atrial fibrillation, HR 91, with no acute ST or T-wave changes when compared to prior tracings.   Recent Labs: 11/28/2016: ALT 14; Hemoglobin 13.6; Platelets 268 03/16/2017: BUN 12; Creatinine, Ser 0.65; Magnesium 2.0; Potassium 3.9; Sodium 130; TSH 2.460   Lipid Panel    Component Value Date/Time   CHOL 211 (H) 11/28/2016 1359   CHOL 168 07/09/2013 0805   TRIG 54 11/28/2016 1359   TRIG 46 07/09/2013 0805   HDL 110 11/28/2016 1359   HDL 78 07/09/2013 0805     CHOLHDL 1.9 11/28/2016 1359   VLDL 11 11/28/2016 1359   LDLCALC 90 11/28/2016 1359   LDLCALC 81 07/09/2013 0805    Additional studies/ records that were reviewed today include:   EKG: 03/16/2017: Atrial fibrillation, HR 115. EKG: 06/13/2016: Sinus bradycardia, HR 57.  Assessment:    1. Persistent atrial fibrillation (HCC)   2. Long-term (current) use of anticoagulants   3. CAD S/P percutaneous coronary angioplasty   4. Essential hypertension, benign   5. OSA on CPAP      Plan:   In order of problems listed above:  1. Persistent Atrial Fibrillation/ Use of Anticoagulants - seen on 03/16/2017 for new-onset palpitations and found to be in atrial fibrillation with HR of 115. Started on Eliquis 5mg  BID for anticoagulation and Cardizem CD 180mg  daily for rate-control. TSH, Mg, and K+ were within normal limits. Echo is pending. - she reports significant improvement in her symptoms since last office visit. No recurrent palpitations. She remains in atrial fibrillation, HR in the 80's - 90's today.  - This patients CHA2DS2-VASc Score and unadjusted Ischemic Stroke Rate (% per year) is equal to 7.2 % stroke rate/year from a score of 5 (HTN, CAD, Female, Age (2)). She denies any evidence of active bleeding. Continue Eliquis 5mg  BID for anticoagulation. Stop ASA.  - will continue with Cardizem CD 180mg  daily for rate-control. Will not further titrate, for when in sinus rhythm she is bradycardiac.  - we reviewed rhythm vs. rate control. With her being asymptomatic at this time, she does not seem too keen on undergoing DCCV. Can further discuss after she has completed 4 weeks of anticoagulation. Continue with plans to obtain an echocardiogram.   2. CAD - s/p DES to LAD in 2008, patent by cath in 2012. - she denies any recent anginal symptoms. - Stop ASA secondary to need for Eliquis. Intolerant to statin therapy (Cholesterol followed by PCP).   3. HTN - BP well-controlled at 132/86 during  today's visit.  - continue Cardizem CD 180mg  daily and Valsartan-HCTZ 320-25mg  daily.   4. OSA - continue CPAP.  Medication Adjustments/Labs and Tests Ordered: Current medicines are reviewed at length with the patient today.  Concerns regarding medicines are  outlined above.  Medication changes, Labs and Tests ordered today are listed in the Patient Instructions below. Patient Instructions  Medication Instructions:  STOP- Aspirin  Labwork: None Ordered  Testing/Procedures: None Ordered  Follow-Up: Your physician recommends that you schedule a follow-up appointment in: Keep appointment with Dr Debara Pickett 05/03 @ 3:20  Any Other Special Instructions Will Be Listed Below (If Applicable).   If you need a refill on your cardiac medications before your next appointment, please call your pharmacy.   Signed, Theresa Heritage, PA  03/23/2017 1:18 PM    [facility] [address], [place], [place]  [address] Phone: [phone]; Fax: [phone]  [address], [place], [place] [address] Phone: [phone]

## 2017-03-23 ENCOUNTER — Ambulatory Visit (INDEPENDENT_AMBULATORY_CARE_PROVIDER_SITE_OTHER): Payer: Medicare Other | Admitting: Student

## 2017-03-23 ENCOUNTER — Encounter: Payer: Self-pay | Admitting: Student

## 2017-03-23 VITALS — BP 132/86 | HR 90 | Ht 63.0 in | Wt 164.0 lb

## 2017-03-23 DIAGNOSIS — Z7901 Long term (current) use of anticoagulants: Secondary | ICD-10-CM | POA: Diagnosis not present

## 2017-03-23 DIAGNOSIS — Z9861 Coronary angioplasty status: Secondary | ICD-10-CM

## 2017-03-23 DIAGNOSIS — G4733 Obstructive sleep apnea (adult) (pediatric): Secondary | ICD-10-CM | POA: Diagnosis not present

## 2017-03-23 DIAGNOSIS — I4819 Other persistent atrial fibrillation: Secondary | ICD-10-CM

## 2017-03-23 DIAGNOSIS — I2510 Atherosclerotic heart disease of native coronary artery without angina pectoris: Secondary | ICD-10-CM

## 2017-03-23 DIAGNOSIS — I481 Persistent atrial fibrillation: Secondary | ICD-10-CM | POA: Diagnosis not present

## 2017-03-23 DIAGNOSIS — I10 Essential (primary) hypertension: Secondary | ICD-10-CM

## 2017-03-23 DIAGNOSIS — Z9989 Dependence on other enabling machines and devices: Secondary | ICD-10-CM

## 2017-03-23 NOTE — Patient Instructions (Signed)
Medication Instructions:  STOP- Aspirin  Labwork: None Ordered  Testing/Procedures: None Ordered  Follow-Up: Your physician recommends that you schedule a follow-up appointment in: Keep appointment with Dr Debara Pickett 05/03 @ 3:20   Any Other Special Instructions Will Be Listed Below (If Applicable).     If you need a refill on your cardiac medications before your next appointment, please call your pharmacy.

## 2017-04-06 ENCOUNTER — Other Ambulatory Visit: Payer: Self-pay

## 2017-04-06 ENCOUNTER — Encounter (HOSPITAL_COMMUNITY): Payer: Self-pay

## 2017-04-06 ENCOUNTER — Emergency Department (HOSPITAL_COMMUNITY)
Admission: EM | Admit: 2017-04-06 | Discharge: 2017-04-06 | Disposition: A | Discharge status: Home / Self Care | Payer: Medicare Other | Attending: Emergency Medicine | Admitting: Emergency Medicine

## 2017-04-06 ENCOUNTER — Ambulatory Visit (HOSPITAL_BASED_OUTPATIENT_CLINIC_OR_DEPARTMENT_OTHER): Payer: Medicare Other

## 2017-04-06 DIAGNOSIS — E039 Hypothyroidism, unspecified: Secondary | ICD-10-CM | POA: Insufficient documentation

## 2017-04-06 DIAGNOSIS — I4891 Unspecified atrial fibrillation: Secondary | ICD-10-CM | POA: Diagnosis not present

## 2017-04-06 DIAGNOSIS — I10 Essential (primary) hypertension: Secondary | ICD-10-CM | POA: Diagnosis not present

## 2017-04-06 DIAGNOSIS — Z87891 Personal history of nicotine dependence: Secondary | ICD-10-CM | POA: Insufficient documentation

## 2017-04-06 DIAGNOSIS — I2510 Atherosclerotic heart disease of native coronary artery without angina pectoris: Secondary | ICD-10-CM | POA: Insufficient documentation

## 2017-04-06 DIAGNOSIS — R0789 Other chest pain: Secondary | ICD-10-CM

## 2017-04-06 DIAGNOSIS — Z79899 Other long term (current) drug therapy: Secondary | ICD-10-CM | POA: Diagnosis not present

## 2017-04-06 DIAGNOSIS — R079 Chest pain, unspecified: Secondary | ICD-10-CM | POA: Diagnosis present

## 2017-04-06 DIAGNOSIS — Z7982 Long term (current) use of aspirin: Secondary | ICD-10-CM | POA: Diagnosis not present

## 2017-04-06 LAB — BASIC METABOLIC PANEL
ANION GAP: 12 (ref 5–15)
BUN: 8 mg/dL (ref 6–20)
CHLORIDE: 96 mmol/L — AB (ref 101–111)
CO2: 24 mmol/L (ref 22–32)
Calcium: 9.4 mg/dL (ref 8.9–10.3)
Creatinine, Ser: 0.71 mg/dL (ref 0.44–1.00)
GFR calc non Af Amer: >60 mL/min (ref >60)
Glucose, Bld: 97 mg/dL (ref 65–99)
POTASSIUM: 4 mmol/L (ref 3.5–5.1)
Sodium: 132 mmol/L — ABNORMAL LOW (ref 135–145)

## 2017-04-06 LAB — CBC
HEMATOCRIT: 38.1 % (ref 36.0–46.0)
HEMOGLOBIN: 13.3 g/dL (ref 12.0–15.0)
MCH: 32.2 pg (ref 26.0–34.0)
MCHC: 34.9 g/dL (ref 30.0–36.0)
MCV: 92.3 fL (ref 78.0–100.0)
Platelets: 254 10*3/uL (ref 150–400)
RBC: 4.13 MIL/uL (ref 3.87–5.11)
RDW: 12.8 % (ref 11.5–15.5)
WBC: 3.8 10*3/uL — AB (ref 4.0–10.5)

## 2017-04-06 LAB — I-STAT TROPONIN, ED: Troponin i, poc: 0 ng/mL (ref 0.00–0.08)

## 2017-04-06 MED ORDER — ASPIRIN 81 MG PO CHEW
324.0000 mg | CHEWABLE_TABLET | Freq: Once | ORAL | Status: AC
  Administered 2017-04-06: 324 mg via ORAL
  Filled 2017-04-06: qty 4

## 2017-04-06 NOTE — ED Provider Notes (Signed)
[facility] DEPT Provider Note   CSN: [id number] Arrival date & time: 04/06/17  1054     History   Chief Complaint Chief Complaint  Patient presents with  . Chest Pain    HPI Theresa Mccarty is a 76 y.o. female.  76 yo F with a chief complaint chest pain. This been going on for the past couple days. Last for about a minute at a time. Resolves and then returns. Patient is unsure what makes it worse. Having some shortness of breath with it as well. Denies diaphoresis or nausea. Denies lower extremity edema. Denies injury.  Patient has been working at [employer] and has been using bolt cutters to cut flowers. She said this motion is made her shoulders very sore.  Patient had a outpatient echocardiogram done today, and while there told the tech that she was having chest pain. After the ultrasound was completed she called the cardiologist to instructed her to come to the ED.   The history is provided by the patient and the spouse.  Chest Pain   This is a new problem. The current episode started yesterday. The problem occurs hourly. The problem has been gradually worsening. The pain is associated with movement, lifting and raising an arm. The pain is present in the epigastric region. The pain is at a severity of 8/10. The pain is moderate. The quality of the pain is described as brief and sharp. The pain does not radiate. Duration of episode(s) is 2 days. The symptoms are aggravated by certain positions. Associated symptoms include shortness of breath. Pertinent negatives include no dizziness, no fever, no headaches, no nausea, no palpitations and no vomiting. She has tried nothing for the symptoms. The treatment provided no relief.  Pertinent negatives for past medical history include no CAD, no DVT, no MI and no PE.    Past Medical History:  Diagnosis Date  . Atrial fibrillation ([place])    a. initially diagnosed in 02/2017. Started on Eliquis  . Back pain, chronic   . Broken  leg 1958  . CAD (coronary artery disease)    a. s/p DES to LAD in 2008, patent by cath in 2012.  [name] Hearing loss    bilateral hearing aids  . History of echocardiogram 09/11/2007   normal  . History of nuclear stress test 11/17/2010   exercise; small fixed anteroseptal defect (?artifact); short run of atrial-tachy during recovery; low risk  . Hyperlipidemia   . Hypertension   . Hypothyroid   . Impaired fasting glucose   . Osteoporosis   . Renal cell carcinoma right; 2011   Dr. Alinda Money, [facility]  . Sleep apnea    on CPAP    Patient Active Problem List   Diagnosis Date Noted  . Atrial fibrillation, new onset ([place]) 03/16/2017  . Essential hypertension 06/13/2016  . Dyslipidemia 06/13/2016  . Annual physical exam 06/13/2016  . Renal cell carcinoma ([place])   . Renal cyst, left   . Hypothyroidism 08/09/2012  . CAD S/P percutaneous coronary angioplasty 08/09/2012  . Essential hypertension, benign 08/09/2012  . Pure hypercholesterolemia 08/09/2012  . OSA on CPAP 08/09/2012  . Osteoporosis 08/09/2012    Past Surgical History:  Procedure Laterality Date  . ABDOMINAL HYSTERECTOMY  1975   PARTIAL; part of 1 ovary remains  . CARDIAC CATHETERIZATION  10/08/2011   LAD-prox stent patent; no significant obstructive CAD; hi LVF end-diastolic pressure  . CORONARY ANGIOPLASTY WITH STENT PLACEMENT  2008   mid LAD  .  IR GENERIC HISTORICAL  08/18/2016   IR RADIOLOGIST EVAL & MGMT 08/18/2016 [name], MD [facility] INTERV RAD  . KIDNEY SURGERY  2011   renal carcinoma - cryoablation -  Dr. Alinda Money and Dr. Laural Roes (cryoablation)  . SPINE SURGERY      OB History    Gravida Para Term Preterm AB Living   2 2       2    SAB TAB Ectopic Multiple Live Births                   Home Medications    Prior to Admission medications   Medication Sig Start Date End Date Taking? Authorizing Provider  apixaban (ELIQUIS) 5 MG TABS tablet Take 1 tablet (5 mg total) by mouth 2 (two) times daily.  03/16/17  Yes Luke K Kilroy, PA-C  B Complex Vitamins (B-COMPLEX/B-12 PO) Take 1 tablet by mouth daily.     Yes Historical Provider, MD  Calcium-Magnesium-Zinc 500-250-12.5 MG TABS Take 1 tablet by mouth daily.   Yes Historical Provider, MD  Cholecalciferol (VITAMIN D) 2000 units tablet Take 2,000 Units by mouth daily.   Yes Historical Provider, MD  Coenzyme Q10 (COQ10) 100 MG CAPS Take 1 tablet by mouth daily.    Yes Historical Provider, MD  diltiazem (CARDIZEM CD) 180 MG 24 hr capsule Take 1 capsule (180 mg total) by mouth daily. 03/16/17  Yes Luke K Kilroy, PA-C  levothyroxine (SYNTHROID, LEVOTHROID) 50 MCG tablet TAKE 1 TABLET (50 MCG TOTAL) BY MOUTH DAILY. 01/26/17  Yes [name], MD  nitroGLYCERIN (NITROSTAT) 0.4 MG SL tablet Place 1 tablet (0.4 mg total) under the tongue every 5 (five) minutes as needed for chest pain. 06/13/16  Yes [name], MD  Polyvinyl Alcohol-Povidone (REFRESH OP) Place 1 drop into both eyes daily.   Yes Historical Provider, MD  Probiotic Product (PROBIOTIC PO) Take 1 capsule by mouth daily.   Yes Historical Provider, MD  Red Yeast Rice Extract (RED YEAST RICE PO) Take by mouth daily.   Yes Historical Provider, MD  valsartan-hydrochlorothiazide (DIOVAN-HCT) 320-25 MG tablet Take 1 tablet by mouth daily. 06/20/16  Yes [name], MD    Family History Family History  Problem Relation Age of Onset  . Heart disease Mother   . Hypertension Mother   . Arthritis Mother   . Osteoporosis Mother   . Depression Sister   . Hyperlipidemia Sister   . Heart disease Father   . Diabetes Brother   . Cancer Sister     unsure, related to smoking?  . Cancer Sister     lung cancer  . Hyperlipidemia Brother   . Hypertension Brother     Social History Social History  Substance Use Topics  . Smoking status: Former Smoker    Quit date: 12/19/1968  . Smokeless tobacco: Never Used  . Alcohol use 1.2 oz/week    2 Glasses of wine per week     Comment: 2 glasses of wine per  day.     Allergies   Atorvastatin; Celebrex [celecoxib]; and Nitrofurantoin   Review of Systems Review of Systems  Constitutional: Negative for chills and fever.  HENT: Negative for congestion and rhinorrhea.   Eyes: Negative for redness and visual disturbance.  Respiratory: Positive for shortness of breath. Negative for wheezing.   Cardiovascular: Positive for chest pain. Negative for palpitations.  Gastrointestinal: Negative for nausea and vomiting.  Genitourinary: Negative for dysuria and urgency.  Musculoskeletal: Negative for arthralgias and myalgias.  Skin:  Negative for pallor and wound.  Neurological: Negative for dizziness and headaches.     Physical Exam Updated Vital Signs BP (!) 138/93   Pulse 79   Temp 97.9 F (36.6 C) (Oral)   Resp 15   SpO2 98%   Physical Exam  Constitutional: She is oriented to person, place, and time. She appears well-developed and well-nourished. No distress.  HENT:  Head: Normocephalic and atraumatic.  Eyes: EOM are normal. Pupils are equal, round, and reactive to light.  Neck: Normal range of motion. Neck supple.  Cardiovascular: An irregularly irregular rhythm present. Tachycardia present.  Exam reveals no gallop and no friction rub.   No murmur heard. Pulmonary/Chest: Effort normal. She has no wheezes. She has no rales. She exhibits tenderness.  Abdominal: Soft. She exhibits no distension and no mass. There is no tenderness. There is no guarding.  Musculoskeletal: She exhibits no edema or tenderness.  Neurological: She is alert and oriented to person, place, and time.  Skin: Skin is warm and dry. She is not diaphoretic.  Psychiatric: She has a normal mood and affect. Her behavior is normal.  Nursing note and vitals reviewed.    ED Treatments / Results  Labs (all labs ordered are listed, but only abnormal results are displayed) Labs Reviewed  BASIC METABOLIC PANEL - Abnormal; Notable for the following:       Result Value    Sodium 132 (*)    Chloride 96 (*)    All other components within normal limits  CBC - Abnormal; Notable for the following:    WBC 3.8 (*)    All other components within normal limits  I-STAT TROPOININ, ED  CBG MONITORING, ED    EKG  EKG Interpretation  Date/Time:  Thursday April 06 2017 11:03:15 EDT Ventricular Rate:  99 PR Interval:    QRS Duration: 65 QT Interval:  355 QTC Calculation: 456 R Axis:   69 Text Interpretation:  Atrial fibrillation Anteroseptal infarct, old Borderline T abnormalities, inferior leads No significant change since last tracing Confirmed by Cipriano Millikan MD, DANIEL ([id number]) on 04/06/2017 12:07:58 PM       Radiology No results found.  Procedures Procedures (including critical care time)  Medications Ordered in ED Medications  aspirin chewable tablet 324 mg (324 mg Oral Given 04/06/17 1108)     Initial Impression / Assessment and Plan / ED Course  I have reviewed the triage vital signs and the nursing notes.  Pertinent labs & imaging results that were available during my care of the patient were reviewed by me and considered in my medical decision making (see chart for details).     76 yo F With a chief complaint of chest pain. This is atypical in nature. Is reproduced on exam. As explained by her using bolt cutters over and over again over the past week. She is not having any pain currently. Troponin is 0. EKG without changes. I will discuss with cardiology as she was sent from that office ensure there were no echocardiogram concerns. I discussed the case with Dr. Harrington Challenger, she evaluated the echo and saw a small motion abnormality at the apex. She has no old to compare. With the patient's symptoms and currently no chest pain she agrees that no further workup in the [facility] department or hospital is required. We'll have her follow-up with her cardiologist in the office.  12:31 PM:  I have discussed the diagnosis/risks/treatment options with the patient and family  and believe the pt to be eligible  for discharge home to follow-up with PCP. We also discussed returning to the ED immediately if new or worsening sx occur. We discussed the sx which are most concerning (e.g., sudden worsening pain, fever, inability to tolerate by mouth) that necessitate immediate return. Medications administered to the patient during their visit and any new prescriptions provided to the patient are listed below.  Medications given during this visit Medications  aspirin chewable tablet 324 mg (324 mg Oral Given 04/06/17 1108)     The patient appears reasonably screen and/or stabilized for discharge and I doubt any other medical condition or other [facility] requiring further screening, evaluation, or treatment in the ED at this time prior to discharge.    Final Clinical Impressions(s) / ED Diagnoses   Final diagnoses:  Atypical chest pain    New Prescriptions New Prescriptions   No medications on file     Deno Etienne, DO 04/06/17 1231

## 2017-04-06 NOTE — Discharge Instructions (Signed)
Follow up with your cardiologist  

## 2017-04-06 NOTE — ED Triage Notes (Signed)
Pt presents via POV for evaluation of generalized CP starting yesterday. Pt reports had echocardiogram today and was told to come to ED for CP. Pt reports SOB, hx of afib on elliquis. Pt denies CP on arrival to ED. Pt AxO x4.

## 2017-04-20 ENCOUNTER — Ambulatory Visit (INDEPENDENT_AMBULATORY_CARE_PROVIDER_SITE_OTHER): Payer: Medicare Other | Admitting: Internal Medicine

## 2017-04-20 VITALS — BP 136/82 | HR 98 | Ht 63.0 in | Wt 164.2 lb

## 2017-04-20 DIAGNOSIS — G4733 Obstructive sleep apnea (adult) (pediatric): Secondary | ICD-10-CM

## 2017-04-20 DIAGNOSIS — Z01818 Encounter for other preprocedural examination: Secondary | ICD-10-CM | POA: Diagnosis not present

## 2017-04-20 DIAGNOSIS — Z9989 Dependence on other enabling machines and devices: Secondary | ICD-10-CM | POA: Diagnosis not present

## 2017-04-20 DIAGNOSIS — I4891 Unspecified atrial fibrillation: Secondary | ICD-10-CM | POA: Diagnosis not present

## 2017-04-20 DIAGNOSIS — I10 Essential (primary) hypertension: Secondary | ICD-10-CM

## 2017-04-20 DIAGNOSIS — Z79899 Other long term (current) drug therapy: Secondary | ICD-10-CM

## 2017-04-20 DIAGNOSIS — D689 Coagulation defect, unspecified: Secondary | ICD-10-CM

## 2017-04-20 DIAGNOSIS — I2510 Atherosclerotic heart disease of native coronary artery without angina pectoris: Secondary | ICD-10-CM

## 2017-04-20 DIAGNOSIS — R5383 Other fatigue: Secondary | ICD-10-CM

## 2017-04-20 MED ORDER — ASPIRIN EC 81 MG PO TBEC: 81.0000 mg | DELAYED_RELEASE_TABLET | Freq: Every day | ORAL | 3 refills | Status: AC

## 2017-04-20 NOTE — Patient Instructions (Signed)
Medication Instructions:  Your physician recommends that you continue on your current medications as directed. Please refer to the Current Medication list given to you today.  If you need a refill on your cardiac medications before your next appointment, please call your pharmacy.  Testing/Procedures: Your physician wants you to [place] 05-12-2017, per DR HILTY, AT [facility]   Thank you for choosing [facility] at [facility]!!

## 2017-04-21 NOTE — Progress Notes (Signed)
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 Recently, she developed substernal chest pain in the fall and underwent cardiac catheterization. This demonstrated patent LAD stent that was proximal as well as 30% disease of the LAD just prior to that stent, but no other areas of obstructive coronary disease. She continued to have chest pain; however, I felt it was most likely related to stress. I have also encouraged dietary changes as well as trying to reduce her stress and alcohol intake. She has accomplished that to some extent and, over the past 6 months has had no other symptoms of chest discomfort. She occasionally gets short of breath with heavy exertion and has some swelling in her legs only when standing up for long periods of time. She continues to work at [employer] although there is some stress she is actually maintaining her exercise regimen. This is clearly demonstrated improvement in her risk factors including a reduction in cholesterol. A recent lipid NMR demonstrated a particle number of 631 and an LDL cholesterol of 81.   Theresa Mccarty returns for annual followup today. She reports feeling very well. She recently had the shingles vaccine at [facility]. She had some questions today about starting coconut oil glucosamine/chondroitin which I think are fine. She did have one episode of discomfort in her chest after working in regard for which she took nitroglycerin however she noted no improvement in her symptoms. She is very interested in decreasing her Lipitor she does report some soreness in her arm muscles as well as her leg muscles. She believes this is related to the statin. She has not been on any other statin since her stent was placed in 2008. She is interested a recheck of her  cholesterol today as well as a screening hemoglobin A1c - she is nondiabetic.  Finally, she recently had a fall at home which was due to tripping over a table leg. She did have some soreness in her right hand as well as ecchymosis and swelling of her digits and 2 bandages on her right toes.  Mr. Brizuela has no specific complaints today. She denies any chest pain or worsening shortness of breath. Blood pressure is at goal. She continues to take aspirin and Plavix without any bleeding complications. Cholesterol is due for recheck. She does report some mild calf tenderness which she attributes to a atorvastatin.  06/13/2016  Theresa Mccarty returns today for follow-up. Over the past year she's done fairly well. She's had some struggles with arthritis. She noted that she was having some side effects related to atorvastatin including weakness in her arm which improved after discontinuing the medicine. She says she's had about 8 pound weight loss however we demonstrated only 1-2 pounds in the office. She is currently on red yeast rice and wishes to have a fasting lipid profile today. She denies any significant chest discomfort or worsening shortness of breath. She recently retired from her job and notes less stress in her life. Blood pressure is well-controlled today.  04/20/2017  Theresa Mccarty was seen today in follow-up. Recently she was seen by Kerin Ransom and had symptoms of worsening fatigue and palpitations. She was found to be in A. fib with RVR. He adjusted some medications for rate control including adding diltiazem and started her on Eliquis. She reports  she's been taking Eliquis for the past several weeks however did miss at least one dose possibly 2. Today heart rate is just below 100 and her rhythm is irregular. She reports she feels better but still is fatigued. We discussed the possibility of a cardioversion attempt. She understands she would need at least 3 weeks of uninterrupted anticoagulation or TEE guided approach  in order to do that. She feels that she could be more compliant with medication.  PMHx:  Past Medical History:  Diagnosis Date  . Atrial fibrillation ([place])    a. initially diagnosed in 02/2017. Started on Eliquis  . Back pain, chronic   . Broken leg 1958  . CAD (coronary artery disease)    a. s/p DES to LAD in 2008, patent by cath in 2012.  [name] Hearing loss    bilateral hearing aids  . History of echocardiogram 09/11/2007   normal  . History of nuclear stress test 11/17/2010   exercise; small fixed anteroseptal defect (?artifact); short run of atrial-tachy during recovery; low risk  . Hyperlipidemia   . Hypertension   . Hypothyroid   . Impaired fasting glucose   . Osteoporosis   . Renal cell carcinoma right; 2011   Dr. Alinda Money, [facility]  . Sleep apnea    on CPAP    Past Surgical History:  Procedure Laterality Date  . ABDOMINAL HYSTERECTOMY  1975   PARTIAL; part of 1 ovary remains  . CARDIAC CATHETERIZATION  10/08/2011   LAD-prox stent patent; no significant obstructive CAD; hi LVF end-diastolic pressure  . CORONARY ANGIOPLASTY WITH STENT PLACEMENT  2008   mid LAD  . IR GENERIC HISTORICAL  08/18/2016   IR RADIOLOGIST EVAL & MGMT 08/18/2016 [name], MD [facility] INTERV RAD  . KIDNEY SURGERY  2011   renal carcinoma - cryoablation -  Dr. Alinda Money and Dr. Laural Roes (cryoablation)  . SPINE SURGERY      FAMHx:  Family History  Problem Relation Age of Onset  . Heart disease Mother   . Hypertension Mother   . Arthritis Mother   . Osteoporosis Mother   . Depression Sister   . Hyperlipidemia Sister   . Heart disease Father   . Diabetes Brother   . Cancer Sister     unsure, related to smoking?  . Cancer Sister     lung cancer  . Hyperlipidemia Brother   . Hypertension Brother     SOCHx:   reports that she quit smoking about 48 years ago. She has never used smokeless tobacco. She reports that she drinks about 1.2 oz of alcohol per week . She reports that she does  not use drugs.  ALLERGIES:  Allergies  Allergen Reactions  . Atorvastatin Other (See Comments)    Muscle aches  . Celebrex [Celecoxib] Other (See Comments)    Muscle aches  . Nitrofurantoin Nausea Only and Rash    ROS: A comprehensive review of systems was negative.  HOME MEDS: Current Outpatient Prescriptions  Medication Sig Dispense Refill  . apixaban (ELIQUIS) 5 MG TABS tablet Take 1 tablet (5 mg total) by mouth 2 (two) times daily. 60 tablet 6  . aspirin EC 81 MG tablet Take 1 tablet (81 mg total) by mouth daily. 90 tablet 3  . B Complex Vitamins (B-COMPLEX/B-12 PO) Take 1 tablet by mouth daily.      . Calcium-Magnesium-Zinc 500-250-12.5 MG TABS Take 1 tablet by mouth daily.    . Cholecalciferol (VITAMIN D) 2000 units tablet Take 2,000 Units by  mouth daily.    . Coenzyme Q10 (COQ10) 100 MG CAPS Take 1 tablet by mouth daily.     [name] diltiazem (CARDIZEM CD) 180 MG 24 hr capsule Take 1 capsule (180 mg total) by mouth daily. 30 capsule 6  . levothyroxine (SYNTHROID, LEVOTHROID) 50 MCG tablet TAKE 1 TABLET (50 MCG TOTAL) BY MOUTH DAILY. 90 tablet 2  . nitroGLYCERIN (NITROSTAT) 0.4 MG SL tablet Place 1 tablet (0.4 mg total) under the tongue every 5 (five) minutes as needed for chest pain. 25 tablet 3  . Polyvinyl Alcohol-Povidone (REFRESH OP) Place 1 drop into both eyes daily.    . Probiotic Product (PROBIOTIC PO) Take 1 capsule by mouth daily.    . Red Yeast Rice Extract (RED YEAST RICE PO) Take by mouth daily.    . valsartan-hydrochlorothiazide (DIOVAN-HCT) 320-25 MG tablet Take 1 tablet by mouth daily. 90 tablet 3   No current facility-administered medications for this visit.     LABS/IMAGING: No results found for this or any previous visit (from the past 48 hour(s)). No results found.  VITALS: BP 136/82 (BP Location: Right Arm, Patient Position: Sitting, Cuff Size: Normal)   Pulse 98   Ht 5\' 3"  (1.6 m)   Wt 164 lb 3.2 oz (74.5 kg)   BMI 29.09 kg/m   EXAM: General  appearance: alert and no distress Neck: no carotid bruit and no JVD Lungs: clear to auscultation bilaterally Heart: irregularly irregular rhythm Abdomen: soft, non-tender; bowel sounds normal; no masses,  no organomegaly Extremities: Right hand ecchymosis, digit swelling, right toes are bandaged Pulses: 2+ and symmetric Skin: Skin color, texture, turgor normal. No rashes or lesions Neurologic: Grossly normal  EKG: Deferred  ASSESSMENT: 1. New onset atrial fibrillation with controlled ventricular response 2. CHADSVASC score of 4 on Eliquis 3. Coronary artery disease status post PCI of the proximal LAD in 2008 4. Hypertension - controlled 5. Hyperlipidemia - controlled 6. Obstructive sleep apnea on CPAP 7. Arthritis  PLAN: 1.   Mrs. Schmall has newly identified atrial fibrillation with a CHADSVASC score 4 on Eliquis. She has missed a few doses of her Eliquis in starting a few weeks ago have encouraged her to work on compliance with the Eliquis. Will plan an additional 3 weeks of anticoagulation and an attempt of cardioversion at that time. I discussed the risks, benefits and alternatives with her length today and she is agreeable to proceed.   [name], MD, [facility] Attending Cardiologist [place] 04/21/2017, 9:43 AM

## 2017-05-02 ENCOUNTER — Other Ambulatory Visit: Payer: Self-pay | Admitting: *Deleted

## 2017-05-02 DIAGNOSIS — Z01818 Encounter for other preprocedural examination: Secondary | ICD-10-CM | POA: Diagnosis not present

## 2017-05-02 DIAGNOSIS — I4891 Unspecified atrial fibrillation: Secondary | ICD-10-CM

## 2017-05-02 DIAGNOSIS — R5383 Other fatigue: Secondary | ICD-10-CM | POA: Diagnosis not present

## 2017-05-02 DIAGNOSIS — D689 Coagulation defect, unspecified: Secondary | ICD-10-CM | POA: Diagnosis not present

## 2017-05-02 DIAGNOSIS — Z79899 Other long term (current) drug therapy: Secondary | ICD-10-CM | POA: Diagnosis not present

## 2017-05-03 LAB — CBC
HEMATOCRIT: 40.9 % (ref 34.0–46.6)
HEMOGLOBIN: 14.1 g/dL (ref 11.1–15.9)
MCH: 32.8 pg (ref 26.6–33.0)
MCHC: 34.5 g/dL (ref 31.5–35.7)
MCV: 95 fL (ref 79–97)
Platelets: 320 10*3/uL (ref 150–379)
RBC: 4.3 x10E6/uL (ref 3.77–5.28)
RDW: 13 % (ref 12.3–15.4)
WBC: 6 10*3/uL (ref 3.4–10.8)

## 2017-05-03 LAB — BASIC METABOLIC PANEL
BUN / CREAT RATIO: 26 (ref 12–28)
BUN: 22 mg/dL (ref 8–27)
CO2: 25 mmol/L (ref 18–29)
CREATININE: 0.84 mg/dL (ref 0.57–1.00)
Calcium: 9.6 mg/dL (ref 8.7–10.3)
Chloride: 97 mmol/L (ref 96–106)
GFR, EST AFRICAN AMERICAN: 79 mL/min/{1.73_m2} (ref >59)
GFR, EST NON AFRICAN AMERICAN: 68 mL/min/{1.73_m2} (ref >59)
Glucose: 87 mg/dL (ref 65–99)
POTASSIUM: 4.8 mmol/L (ref 3.5–5.2)
SODIUM: 138 mmol/L (ref 134–144)

## 2017-05-03 LAB — APTT: aPTT: 28 s (ref 24–33)

## 2017-05-03 LAB — PROTIME-INR
INR: 1 (ref 0.8–1.2)
Prothrombin Time: 10.5 s (ref 9.1–12.0)

## 2017-05-03 LAB — TSH: TSH: 1.32 u[IU]/mL (ref 0.450–4.500)

## 2017-05-12 ENCOUNTER — Encounter (HOSPITAL_COMMUNITY): Payer: Self-pay | Admitting: *Deleted

## 2017-05-12 ENCOUNTER — Ambulatory Visit (HOSPITAL_COMMUNITY): Payer: Medicare Other | Admitting: Certified Registered Nurse Anesthetist

## 2017-05-12 ENCOUNTER — Ambulatory Visit (HOSPITAL_COMMUNITY)
Admission: RE | Admit: 2017-05-12 | Discharge: 2017-05-12 | Disposition: A | Discharge status: Home / Self Care | Payer: Medicare Other | Source: Ambulatory Visit | Attending: Internal Medicine | Admitting: Internal Medicine

## 2017-05-12 ENCOUNTER — Encounter (HOSPITAL_COMMUNITY)
Admission: RE | Disposition: A | Discharge status: Home / Self Care | Payer: Self-pay | Source: Ambulatory Visit | Attending: Internal Medicine

## 2017-05-12 DIAGNOSIS — I10 Essential (primary) hypertension: Secondary | ICD-10-CM | POA: Diagnosis not present

## 2017-05-12 DIAGNOSIS — E785 Hyperlipidemia, unspecified: Secondary | ICD-10-CM | POA: Diagnosis not present

## 2017-05-12 DIAGNOSIS — G4730 Sleep apnea, unspecified: Secondary | ICD-10-CM | POA: Insufficient documentation

## 2017-05-12 DIAGNOSIS — I4891 Unspecified atrial fibrillation: Secondary | ICD-10-CM

## 2017-05-12 DIAGNOSIS — Z87891 Personal history of nicotine dependence: Secondary | ICD-10-CM | POA: Diagnosis not present

## 2017-05-12 DIAGNOSIS — G4733 Obstructive sleep apnea (adult) (pediatric): Secondary | ICD-10-CM | POA: Diagnosis not present

## 2017-05-12 DIAGNOSIS — I2510 Atherosclerotic heart disease of native coronary artery without angina pectoris: Secondary | ICD-10-CM | POA: Diagnosis not present

## 2017-05-12 DIAGNOSIS — E039 Hypothyroidism, unspecified: Secondary | ICD-10-CM | POA: Insufficient documentation

## 2017-05-12 HISTORY — PX: CARDIOVERSION: SHX1299

## 2017-05-12 SURGERY — CARDIOVERSION
Anesthesia: General

## 2017-05-12 MED ORDER — PROPOFOL 10 MG/ML IV BOLUS
INTRAVENOUS | Status: DC | PRN
  Administered 2017-05-12: 100 mg via INTRAVENOUS

## 2017-05-12 MED ORDER — DILTIAZEM HCL ER COATED BEADS 240 MG PO TB24: 240.0000 mg | ORAL_TABLET | Freq: Every day | ORAL | 11 refills | Status: DC

## 2017-05-12 MED ORDER — RIVAROXABAN 20 MG PO TABS: 20.0000 mg | ORAL_TABLET | Freq: Every day | ORAL | 11 refills | Status: DC

## 2017-05-12 MED ORDER — LIDOCAINE 2% (20 MG/ML) 5 ML SYRINGE
INTRAMUSCULAR | Status: DC | PRN
  Administered 2017-05-12: 100 mg via INTRAVENOUS

## 2017-05-12 MED ORDER — SODIUM CHLORIDE 0.9 % IV SOLN
INTRAVENOUS | Status: DC | PRN
  Administered 2017-05-12: 09:00:00 via INTRAVENOUS

## 2017-05-12 MED ORDER — SODIUM CHLORIDE 0.9 % IV SOLN
INTRAVENOUS | Status: DC
  Administered 2017-05-12: 09:00:00 via INTRAVENOUS

## 2017-05-12 NOTE — Anesthesia Postprocedure Evaluation (Signed)
Anesthesia Post Note  Patient: Theresa Mccarty  Procedure(s) Performed: Procedure(s) (LRB): CARDIOVERSION (N/A)  Patient location during evaluation: Endoscopy Anesthesia Type: General Level of consciousness: awake and alert Pain management: pain level controlled Vital Signs Assessment: post-procedure vital signs reviewed and stable Respiratory status: spontaneous breathing, nonlabored ventilation, respiratory function stable and patient connected to nasal cannula oxygen Cardiovascular status: blood pressure returned to baseline and stable Postop Assessment: no signs of nausea or vomiting Anesthetic complications: no       Last Vitals:  Vitals:   05/12/17 1030 05/12/17 1040  BP: (!) 159/113 (!) 148/86  Pulse: 92 (!) 42  Resp: 13 (!) 23  Temp:      Last Pain:  Vitals:   05/12/17 1016  TempSrc: Oral                 Montez Hageman

## 2017-05-12 NOTE — CV Procedure (Signed)
   CARDIOVERSION NOTE  Procedure: Electrical Cardioversion Indications:  Atrial Fibrillation  Procedure Details:  Consent: Risks of procedure as well as the alternatives and risks of each were explained to the (patient/caregiver).  Consent for procedure obtained.  Time Out: Verified patient identification, verified procedure, site/side was marked, verified correct patient position, special equipment/implants available, medications/allergies/relevent history reviewed, required imaging and test results available.  Performed  Patient placed on cardiac monitor, pulse oximetry, supplemental oxygen as necessary.  Sedation given: Propofol per anesthesia Pacer pads placed anterior and posterior chest.  Cardioverted 4 time(s).  Cardioverted at 120J, 150J, 200J x 2 biphasic.  Impression: Findings: Post procedure EKG shows: Atrial Fibrillation Complications: None Patient did tolerate procedure well.  Plan: 1. Unsuccessful DCCV - no evidence of intermittent sinus rhythm. 2. Will increase rate-control. 3. Switch from Eliquis to Xarelto - patient prefers once daily dosing and insurance cost is cheaper. 30 day card provided.  Time Spent Directly with the Patient:  30 minutes   [name], MD, [place]  Attending Cardiologist  Direct Dial: [phone]  Fax: [phone]  Website:  [email or web address]  Theresa Mccarty 05/12/2017, 10:26 AM

## 2017-05-12 NOTE — Anesthesia Preprocedure Evaluation (Addendum)
Anesthesia Evaluation  Patient identified by MRN, date of birth, ID band Patient awake    Reviewed: Allergy & Precautions, NPO status , Patient's Chart, lab work & pertinent test results  Airway Mallampati: II  TM Distance: >3 FB Neck ROM: Full    Dental no notable dental hx. (+) Dental Advisory Given   Pulmonary sleep apnea and Continuous Positive Airway Pressure Ventilation , former smoker,    Pulmonary exam normal breath sounds clear to auscultation       Cardiovascular hypertension, Pt. on medications and Pt. on home beta blockers + CAD and + Cardiac Stents  Normal cardiovascular exam+ dysrhythmias Atrial Fibrillation  Rhythm:Regular Rate:Normal     Neuro/Psych negative neurological ROS  negative psych ROS   GI/Hepatic negative GI ROS, Neg liver ROS,   Endo/Other  Hypothyroidism   Renal/GU negative Renal ROS  negative genitourinary   Musculoskeletal negative musculoskeletal ROS (+)   Abdominal   Peds negative pediatric ROS (+)  Hematology negative hematology ROS (+)   Anesthesia Other Findings   Reproductive/Obstetrics negative OB ROS                            Anesthesia Physical Anesthesia Plan  ASA: III  Anesthesia Plan: General   Post-op Pain Management:    Induction: Intravenous  Airway Management Planned: Natural Airway  Additional Equipment:   Intra-op Plan:   Post-operative Plan:   Informed Consent: I have reviewed the patients History and Physical, chart, labs and discussed the procedure including the risks, benefits and alternatives for the proposed anesthesia with the patient or authorized representative who has indicated his/her understanding and acceptance.   Dental advisory given  Plan Discussed with: CRNA and Anesthesiologist  Anesthesia Plan Comments:         Anesthesia Quick Evaluation

## 2017-05-12 NOTE — H&P (Signed)
   INTERVAL PROCEDURE H&P  History and Physical Interval Note:  05/12/2017 9:11 AM  Theresa Mccarty has presented today for their planned procedure. The various methods of treatment have been discussed with the patient and family. After consideration of risks, benefits and other options for treatment, the patient has consented to the procedure.  The patients' outpatient history has been reviewed, patient examined, and no change in status from most recent office note within the past 30 days. I have reviewed the patients' chart and labs and will proceed as planned. Questions were answered to the patient's satisfaction.   [name], MD, [place]  Attending Cardiologist  Direct Dial: [phone]  Fax: [phone]  Website:  [email or web address] 05/12/2017, 9:11 AM

## 2017-05-12 NOTE — Anesthesia Procedure Notes (Signed)
Procedure Name: MAC Date/Time: 05/12/2017 10:00 AM Performed by: Garrison Columbus T Pre-anesthesia Checklist: Patient identified, Emergency Drugs available, Suction available and Patient being monitored Patient Re-evaluated:Patient Re-evaluated prior to inductionOxygen Delivery Method: Ambu bag Preoxygenation: Pre-oxygenation with 100% oxygen Intubation Type: IV induction Placement Confirmation: positive ETCO2 and breath sounds checked- equal and bilateral Dental Injury: Teeth and Oropharynx as per pre-operative assessment

## 2017-05-12 NOTE — Transfer of Care (Signed)
Immediate Anesthesia Transfer of Care Note  Patient: Theresa Mccarty  Procedure(s) Performed: Procedure(s): CARDIOVERSION (N/A)  Patient Location: Endoscopy Unit  Anesthesia Type:General  Level of Consciousness: awake, alert  and oriented  Airway & Oxygen Therapy: Patient Spontanous Breathing and Patient connected to nasal cannula oxygen  Post-op Assessment: Report given to RN, Post -op Vital signs reviewed and stable and Patient moving all extremities X 4  Post vital signs: Reviewed and stable  Last Vitals:  Vitals:   05/12/17 0857  BP: (!) 167/100  Resp: 20  Temp: 36.5 C    Last Pain:  Vitals:   05/12/17 0857  TempSrc: Oral         Complications: No apparent anesthesia complications

## 2017-05-12 NOTE — Discharge Instructions (Signed)
Electrical Cardioversion, Care After °This sheet gives you information about how to care for yourself after your procedure. Your health care provider may also give you more specific instructions. If you have problems or questions, contact your health care provider. °What can I expect after the procedure? °After the procedure, it is common to have: °· Some redness on the skin where the shocks were given. °Follow these instructions at home: °· Do not drive for 24 hours if you were given a medicine to help you relax (sedative). °· Take over-the-counter and prescription medicines only as told by your health care provider. °· Ask your health care provider how to check your pulse. Check it often. °· Rest for 48 hours after the procedure or as told by your health care provider. °· Avoid or limit your caffeine use as told by your health care provider. °Contact a health care provider if: °· You feel like your heart is beating too quickly or your pulse is not regular. °· You have a serious muscle cramp that does not go away. °Get help right away if: °· You have discomfort in your chest. °· You are dizzy or you feel faint. °· You have trouble breathing or you are short of breath. °· Your speech is slurred. °· You have trouble moving an arm or leg on one side of your body. °· Your fingers or toes turn cold or blue. °This information is not intended to replace advice given to you by your health care provider. Make sure you discuss any questions you have with your health care provider. °Document Released: 09/25/2013 Document Revised: 07/08/2016 Document Reviewed: 06/10/2016 °Elsevier Interactive Patient Education © 2017 [employer] ° °

## 2017-05-13 ENCOUNTER — Encounter (HOSPITAL_COMMUNITY): Payer: Self-pay | Admitting: Internal Medicine

## 2017-05-18 ENCOUNTER — Telehealth: Payer: Self-pay | Admitting: Internal Medicine

## 2017-05-18 NOTE — Telephone Encounter (Signed)
Pt voiced understanding of instructions and rationale. She was thankful for this advice. Aware to call if further needs.

## 2017-05-18 NOTE — Telephone Encounter (Signed)
Pt wishes to take xarelto in AM for better compliance. Any problem with doing this?

## 2017-05-18 NOTE — Telephone Encounter (Signed)
Medication is better absorbed with full meal.   Okay to take in the morning if taken with breakfast.

## 2017-05-18 NOTE — Telephone Encounter (Signed)
Theresa Mccarty is wanting to know can she take her xaelto in the morning and not the evening .  Please call .[name] Thanks

## 2017-06-10 ENCOUNTER — Other Ambulatory Visit: Payer: Self-pay | Admitting: Internal Medicine

## 2017-06-16 ENCOUNTER — Encounter: Payer: Self-pay | Admitting: Internal Medicine

## 2017-06-16 ENCOUNTER — Ambulatory Visit: Payer: Medicare Other | Admitting: Internal Medicine

## 2017-06-16 ENCOUNTER — Ambulatory Visit (INDEPENDENT_AMBULATORY_CARE_PROVIDER_SITE_OTHER): Payer: Medicare Other | Admitting: Internal Medicine

## 2017-06-16 VITALS — BP 154/95 | HR 88 | Ht 63.0 in | Wt 167.2 lb

## 2017-06-16 DIAGNOSIS — G4733 Obstructive sleep apnea (adult) (pediatric): Secondary | ICD-10-CM | POA: Diagnosis not present

## 2017-06-16 DIAGNOSIS — I10 Essential (primary) hypertension: Secondary | ICD-10-CM

## 2017-06-16 DIAGNOSIS — I4819 Other persistent atrial fibrillation: Secondary | ICD-10-CM

## 2017-06-16 DIAGNOSIS — Z9861 Coronary angioplasty status: Secondary | ICD-10-CM

## 2017-06-16 DIAGNOSIS — Z9989 Dependence on other enabling machines and devices: Secondary | ICD-10-CM | POA: Diagnosis not present

## 2017-06-16 DIAGNOSIS — I481 Persistent atrial fibrillation: Secondary | ICD-10-CM

## 2017-06-16 DIAGNOSIS — I2510 Atherosclerotic heart disease of native coronary artery without angina pectoris: Secondary | ICD-10-CM

## 2017-06-16 NOTE — Patient Instructions (Signed)
Your physician wants you to follow-up in: 6 months with Dr. Hilty. You will receive a reminder letter in the mail two months in advance. If you don't receive a letter, please call our office to schedule the follow-up appointment.    

## 2017-06-16 NOTE — Progress Notes (Signed)
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 Recently, she developed substernal chest pain in the fall and underwent cardiac catheterization. This demonstrated patent LAD stent that was proximal as well as 30% disease of the LAD just prior to that stent, but no other areas of obstructive coronary disease. She continued to have chest pain; however, I felt it was most likely related to stress. I have also encouraged dietary changes as well as trying to reduce her stress and alcohol intake. She has accomplished that to some extent and, over the past 6 months has had no other symptoms of chest discomfort. She occasionally gets short of breath with heavy exertion and has some swelling in her legs only when standing up for long periods of time. She continues to work at [employer] although there is some stress she is actually maintaining her exercise regimen. This is clearly demonstrated improvement in her risk factors including a reduction in cholesterol. A recent lipid NMR demonstrated a particle number of 631 and an LDL cholesterol of 81.   Theresa Mccarty returns for annual followup today. She reports feeling very well. She recently had the shingles vaccine at [facility]. She had some questions today about starting coconut oil glucosamine/chondroitin which I think are fine. She did have one episode of discomfort in her chest after working in regard for which she took nitroglycerin however she noted no improvement in her symptoms. She is very interested in decreasing her Lipitor she does report some soreness in her arm muscles as well as her leg muscles. She believes this is related to the statin. She has not been on any other statin since her stent was placed in 2008. She is interested a recheck of  her cholesterol today as well as a screening hemoglobin A1c - she is nondiabetic.  Finally, she recently had a fall at home which was due to tripping over a table leg. She did have some soreness in her right hand as well as ecchymosis and swelling of her digits and 2 bandages on her right toes.  Theresa Mccarty has no specific complaints today. She denies any chest pain or worsening shortness of breath. Blood pressure is at goal. She continues to take aspirin and Plavix without any bleeding complications. Cholesterol is due for recheck. She does report some mild calf tenderness which she attributes to a atorvastatin.  06/13/2016  Theresa Mccarty returns today for follow-up. Over the past year she's done fairly well. She's had some struggles with arthritis. She noted that she was having some side effects related to atorvastatin including weakness in her arm which improved after discontinuing the medicine. She says she's had about 8 pound weight loss however we demonstrated only 1-2 pounds in the office. She is currently on red yeast rice and wishes to have a fasting lipid profile today. She denies any significant chest discomfort or worsening shortness of breath. She recently retired from her job and notes less stress in her life. Blood pressure is well-controlled today.  04/20/2017  Theresa Mccarty was seen today in follow-up. Recently she was seen by Kerin Ransom and had symptoms of worsening fatigue and palpitations. She was found to be in A. fib with RVR. He adjusted some medications for rate control including adding diltiazem and started her on Eliquis. She  reports she's been taking Eliquis for the past several weeks however did miss at least one dose possibly 2. Today heart rate is just below 100 and her rhythm is irregular. She reports she feels better but still is fatigued. We discussed the possibility of a cardioversion attempt. She understands she would need at least 3 weeks of uninterrupted anticoagulation or TEE guided  approach in order to do that. She feels that she could be more compliant with medication.  06/16/2017  Theresa Mccarty returns today for follow-up. She underwent cardioversion which was unsuccessful. Subsequently she said that she does not think that she was terribly symptomatic therefore we proceeded rate controlled. I increased her diltiazem and heart rate is well-controlled in the 80s today. She reports no significant fatigue. She is switched from Eliquis to Xarelto due to the fact that she had trouble remembering the second dose of Eliquis. Blood pressure was elevated somewhat initially 154/95 however came down to 138/88. She also reports some abdominal bloating but his recent started on probiotics. She gets some lower extremity swelling but has some evidence of venous insufficiency. She is on hydrochlorothiazide with Diovan.  PMHx:  Past Medical History:  Diagnosis Date  . Atrial fibrillation ([place])    a. initially diagnosed in 02/2017. Started on Eliquis  . Back pain, chronic   . Broken leg 1958  . CAD (coronary artery disease)    a. s/p DES to LAD in 2008, patent by cath in 2012.  [name] Hearing loss    bilateral hearing aids  . History of echocardiogram 09/11/2007   normal  . History of nuclear stress test 11/17/2010   exercise; small fixed anteroseptal defect (?artifact); short run of atrial-tachy during recovery; low risk  . Hyperlipidemia   . Hypertension   . Hypothyroid   . Impaired fasting glucose   . Osteoporosis   . Renal cell carcinoma right; 2011   Dr. Alinda Money, [facility]  . Sleep apnea    on CPAP    Past Surgical History:  Procedure Laterality Date  . ABDOMINAL HYSTERECTOMY  1975   PARTIAL; part of 1 ovary remains  . CARDIAC CATHETERIZATION  10/08/2011   LAD-prox stent patent; no significant obstructive CAD; hi LVF end-diastolic pressure  . CARDIOVERSION N/A 05/12/2017   Procedure: CARDIOVERSION;  Surgeon: [name], MD;  Location: [facility] ENDOSCOPY;  Service:  Cardiovascular;  Laterality: N/A;  . CORONARY ANGIOPLASTY WITH STENT PLACEMENT  2008   mid LAD  . IR GENERIC HISTORICAL  08/18/2016   IR RADIOLOGIST EVAL & MGMT 08/18/2016 [name], MD [facility] INTERV RAD  . KIDNEY SURGERY  2011   renal carcinoma - cryoablation -  Dr. Alinda Money and Dr. Laural Roes (cryoablation)  . SPINE SURGERY      FAMHx:  Family History  Problem Relation Age of Onset  . Heart disease Mother   . Hypertension Mother   . Arthritis Mother   . Osteoporosis Mother   . Depression Sister   . Hyperlipidemia Sister   . Heart disease Father   . Diabetes Brother   . Cancer Sister        unsure, related to smoking?  . Cancer Sister        lung cancer  . Hyperlipidemia Brother   . Hypertension Brother     SOCHx:   reports that she quit smoking about 48 years ago. She has never used smokeless tobacco. She reports that she drinks about 1.2 oz of alcohol per week . She reports that she does not  use drugs.  ALLERGIES:  Allergies  Allergen Reactions  . Atorvastatin Other (See Comments)    Muscle aches  . Celebrex [Celecoxib] Other (See Comments)    Muscle aches  . Nitrofurantoin Nausea Only and Rash    ROS: Pertinent items noted in HPI and remainder of comprehensive ROS otherwise negative.  HOME MEDS: Current Outpatient Prescriptions  Medication Sig Dispense Refill  . aspirin EC 81 MG tablet Take 1 tablet (81 mg total) by mouth daily. 90 tablet 3  . B Complex Vitamins (B-COMPLEX/B-12 PO) Take 1 tablet by mouth daily.      . Calcium-Magnesium-Zinc 500-250-12.5 MG TABS Take 1 tablet by mouth daily.    . Cholecalciferol (VITAMIN D) 2000 units tablet Take 2,000 Units by mouth daily.    . Coenzyme Q10 (COQ10) 100 MG CAPS Take 1 tablet by mouth daily.     [name] diltiazem (CARDIZEM LA) 240 MG 24 hr tablet Take 1 tablet (240 mg total) by mouth daily. 30 tablet 11  . levothyroxine (SYNTHROID, LEVOTHROID) 50 MCG tablet TAKE 1 TABLET (50 MCG TOTAL) BY MOUTH DAILY. 90 tablet 2  .  nitroGLYCERIN (NITROSTAT) 0.4 MG SL tablet Place 1 tablet (0.4 mg total) under the tongue every 5 (five) minutes as needed for chest pain. 25 tablet 3  . Polyvinyl Alcohol-Povidone (REFRESH OP) Place 1 drop into both eyes daily.    . Probiotic Product (PROBIOTIC PO) Take 1 capsule by mouth daily.    . Red Yeast Rice Extract (RED YEAST RICE PO) Take by mouth daily.    . rivaroxaban (XARELTO) 20 MG TABS tablet Take 1 tablet (20 mg total) by mouth daily with supper. 30 tablet 11  . valsartan-hydrochlorothiazide (DIOVAN-HCT) 320-25 MG tablet TAKE 1 TABLET BY MOUTH DAILY. 90 tablet 2   No current facility-administered medications for this visit.     LABS/IMAGING: No results found for this or any previous visit (from the past 48 hour(s)). No results found.  VITALS: BP (!) 154/95   Pulse 88   Ht 5\' 3"  (1.6 m)   Wt 167 lb 3.2 oz (75.8 kg)   BMI 29.62 kg/m   EXAM: General appearance: alert and no distress Neck: no carotid bruit and no JVD Lungs: clear to auscultation bilaterally Heart: irregularly irregular rhythm Abdomen: soft, non-tender; bowel sounds normal; no masses,  no organomegaly Extremities: extremities normal, atraumatic, no cyanosis or edema Pulses: 2+ and symmetric Skin: Skin color, texture, turgor normal. No rashes or lesions Neurologic: Grossly normal Psych: Pleasant  EKG: Atrial fibrillation at 88  ASSESSMENT: 1. Persistent atrial fibrillation with controlled ventricular response - failed cardioversion 2. CHADSVASC score of 4 on Xarelto 3. Coronary artery disease status post PCI of the proximal LAD in 2008 4. Hypertension - controlled 5. Hyperlipidemia - controlled 6. Obstructive sleep apnea on CPAP 7. Arthritis  PLAN: 1.   Mrs. Naim has persistent atrial fibrillation which was not able to be cardioverted. She reports being essentially asymptomatic therefore we focused on rate control. She is on Xarelto for CHADSVASC score 4. Blood pressure is well controlled.  She's been compliant with CPAP. She reports abdominal bloating which sounds more GI in nature. I recommend follow-up in 6 months or sooner as necessary.   [name], MD, [facility] Attending Cardiologist [place] 06/16/2017, 5:37 PM

## 2017-07-24 DIAGNOSIS — H2513 Age-related nuclear cataract, bilateral: Secondary | ICD-10-CM | POA: Diagnosis not present

## 2017-07-24 DIAGNOSIS — H52203 Unspecified astigmatism, bilateral: Secondary | ICD-10-CM | POA: Diagnosis not present

## 2017-07-24 DIAGNOSIS — H524 Presbyopia: Secondary | ICD-10-CM | POA: Diagnosis not present

## 2017-07-24 DIAGNOSIS — H5213 Myopia, bilateral: Secondary | ICD-10-CM | POA: Diagnosis not present

## 2017-08-04 ENCOUNTER — Other Ambulatory Visit (HOSPITAL_COMMUNITY): Payer: Self-pay | Admitting: Urology

## 2017-08-04 ENCOUNTER — Ambulatory Visit (HOSPITAL_COMMUNITY)
Admission: RE | Admit: 2017-08-04 | Discharge: 2017-08-04 | Disposition: A | Discharge status: Home / Self Care | Payer: Medicare Other | Source: Ambulatory Visit | Attending: Urology | Admitting: Urology

## 2017-08-04 DIAGNOSIS — C641 Malignant neoplasm of right kidney, except renal pelvis: Secondary | ICD-10-CM | POA: Diagnosis not present

## 2017-08-04 DIAGNOSIS — R918 Other nonspecific abnormal finding of lung field: Secondary | ICD-10-CM | POA: Diagnosis not present

## 2017-08-06 DIAGNOSIS — N390 Urinary tract infection, site not specified: Secondary | ICD-10-CM | POA: Diagnosis not present

## 2017-08-11 DIAGNOSIS — C641 Malignant neoplasm of right kidney, except renal pelvis: Secondary | ICD-10-CM | POA: Diagnosis not present

## 2017-08-16 ENCOUNTER — Telehealth: Payer: Self-pay | Admitting: Internal Medicine

## 2017-08-16 NOTE — Telephone Encounter (Signed)
Theresa Mccarty is calling because she wants to speak to someone about the Xarelto and the side effects . Please call

## 2017-08-16 NOTE — Telephone Encounter (Signed)
S/w pt she states that 2 weeks ago on sat she woke with sx of UTI she states that she went to UC and received rx for antibiotics and followed up with urology. At [facility] appt he said that her urine was clear last week. Pt states that she awoke this morning with urinary sx again and assumed that since her urine was clear, she thought that this was a side effect of the Xarelto or valsartan-HCTZ. Informed pt that this is not one of the s/e that we know of. Pt will call URO or PCP for further direction. She will call back if she needs anything else from [place].

## 2017-08-17 DIAGNOSIS — R3915 Urgency of urination: Secondary | ICD-10-CM | POA: Diagnosis not present

## 2017-08-17 DIAGNOSIS — R350 Frequency of micturition: Secondary | ICD-10-CM | POA: Diagnosis not present

## 2017-08-30 ENCOUNTER — Other Ambulatory Visit (INDEPENDENT_AMBULATORY_CARE_PROVIDER_SITE_OTHER): Payer: Medicare Other

## 2017-08-30 DIAGNOSIS — Z23 Encounter for immunization: Secondary | ICD-10-CM

## 2017-09-20 ENCOUNTER — Telehealth: Payer: Self-pay | Admitting: Internal Medicine

## 2017-09-20 NOTE — Telephone Encounter (Signed)
New Message ° °Patient calling the office for samples of medication: ° ° °1.  What medication and dosage are you requesting samples for? Xarelto 20mg ° °2.  Are you currently out of this medication? yes ° ° ° ° °

## 2017-09-21 MED ORDER — RIVAROXABAN 20 MG PO TABS: 20.0000 mg | ORAL_TABLET | Freq: Every day | ORAL | 0 refills | Status: DC

## 2017-09-21 NOTE — Telephone Encounter (Signed)
Patient aware samples are at the front desk for pick up  

## 2017-10-19 ENCOUNTER — Other Ambulatory Visit: Payer: Self-pay | Admitting: Family Medicine

## 2017-10-19 DIAGNOSIS — E039 Hypothyroidism, unspecified: Secondary | ICD-10-CM

## 2017-11-13 ENCOUNTER — Ambulatory Visit (INDEPENDENT_AMBULATORY_CARE_PROVIDER_SITE_OTHER): Payer: Medicare Other | Admitting: Family Medicine

## 2017-11-13 ENCOUNTER — Encounter: Payer: Self-pay | Admitting: Family Medicine

## 2017-11-13 VITALS — BP 140/78 | HR 88 | Ht 63.0 in | Wt 164.0 lb

## 2017-11-13 DIAGNOSIS — M7582 Other shoulder lesions, left shoulder: Secondary | ICD-10-CM | POA: Diagnosis not present

## 2017-11-13 DIAGNOSIS — M25512 Pain in left shoulder: Secondary | ICD-10-CM | POA: Diagnosis not present

## 2017-11-13 DIAGNOSIS — M25519 Pain in unspecified shoulder: Secondary | ICD-10-CM

## 2017-11-13 MED ORDER — BUPIVACAINE HCL 0.5 % IJ SOLN
50.0000 mL | Freq: Once | INTRAMUSCULAR | Status: AC
  Administered 2017-11-13: 50 mL

## 2017-11-13 MED ORDER — LIDOCAINE HCL 2 % IJ SOLN
4.0000 mL | Freq: Once | INTRAMUSCULAR | Status: AC
  Administered 2017-11-13: 80 mg

## 2017-11-13 MED ORDER — TRIAMCINOLONE ACETONIDE 40 MG/ML IJ SUSP
40.0000 mg | Freq: Once | INTRAMUSCULAR | Status: AC
  Administered 2017-11-13: 40 mg via INTRAMUSCULAR

## 2017-11-13 NOTE — Patient Instructions (Signed)
Use tylenol as needed for pain. You may continue to use biofreeze/icy hot or other topical medication for pain, if needed. Work on the range of motion exercises as shown (circles leaning forward, walking arm up the wall front and sideways, and reaching back like putting on a bra).  If you aren't improving, next steps would be physical therapy and/or orthopedic evaluation.   Fill in the blanks below--holding for 10-15 seconds, repeat 10 repetitions, 2 times/day.   Shoulder Exercises Ask your health care provider which exercises are safe for you. Do exercises exactly as told by your health care provider and adjust them as directed. It is normal to feel mild stretching, pulling, tightness, or discomfort as you do these exercises, but you should stop right away if you feel sudden pain or your pain gets worse.Do not begin these exercises until told by your health care provider. RANGE OF MOTION EXERCISES These exercises warm up your muscles and joints and improve the movement and flexibility of your shoulder. These exercises also help to relieve pain, numbness, and tingling. These exercises involve stretching your injured shoulder directly. Exercise A: Pendulum  1. Stand near a wall or a surface that you can hold onto for balance. 2. Bend at the waist and let your left / right arm hang straight down. Use your other arm to support you. Keep your back straight and do not lock your knees. 3. Relax your left / right arm and shoulder muscles, and move your hips and your trunk so your left / right arm swings freely. Your arm should swing because of the motion of your body, not because you are using your arm or shoulder muscles. 4. Keep moving your body so your arm swings in the following directions, as told by your health care provider: ? Side to side. ? Forward and backward. ? In clockwise and counterclockwise circles. 5. Continue each motion for __________ seconds, or for as long as told by your  health care provider. 6. Slowly return to the starting position. Repeat __________ times. Complete this exercise __________ times a day. Exercise B:Flexion, Standing  1. Stand and hold a broomstick, a cane, or a similar object. Place your hands a little more than shoulder-width apart on the object. Your left / right hand should be palm-up, and your other hand should be palm-down. 2. Keep your elbow straight and keep your shoulder muscles relaxed. Push the stick down with your healthy arm to raise your left / right arm in front of your body, and then over your head until you feel a stretch in your shoulder. ? Avoid shrugging your shoulder while you raise your arm. Keep your shoulder blade tucked down toward the middle of your back. 3. Hold for __________ seconds. 4. Slowly return to the starting position. Repeat __________ times. Complete this exercise __________ times a day. Exercise C: Abduction, Standing 1. Stand and hold a broomstick, a cane, or a similar object. Place your hands a little more than shoulder-width apart on the object. Your left / right hand should be palm-up, and your other hand should be palm-down. 2. While keeping your elbow straight and your shoulder muscles relaxed, push the stick across your body toward your left / right side. Raise your left / right arm to the side of your body and then over your head until you feel a stretch in your shoulder. ? Do not raise your arm above shoulder height, unless your health care provider tells you to do that. ? Avoid  shrugging your shoulder while you raise your arm. Keep your shoulder blade tucked down toward the middle of your back. 3. Hold for __________ seconds. 4. Slowly return to the starting position. Repeat __________ times. Complete this exercise __________ times a day. Exercise D:Internal Rotation  1. Place your left / right hand behind your back, palm-up. 2. Use your other hand to dangle an exercise band, a towel, or a  similar object over your shoulder. Grasp the band with your left / right hand so you are holding onto both ends. 3. Gently pull up on the band until you feel a stretch in the front of your left / right shoulder. ? Avoid shrugging your shoulder while you raise your arm. Keep your shoulder blade tucked down toward the middle of your back. 4. Hold for __________ seconds. 5. Release the stretch by letting go of the band and lowering your hands. Repeat __________ times. Complete this exercise __________ times a day. STRETCHING EXERCISES These exercises warm up your muscles and joints and improve the movement and flexibility of your shoulder. These exercises also help to relieve pain, numbness, and tingling. These exercises are done using your healthy shoulder to help stretch the muscles of your injured shoulder. Exercise E: Warehouse manager (External Rotation and Abduction)  1. Stand in a doorway with one of your feet slightly in front of the other. This is called a staggered stance. If you cannot reach your forearms to the door frame, stand facing a corner of a room. 2. Choose one of the following positions as told by your health care provider: ? Place your hands and forearms on the door frame above your head. ? Place your hands and forearms on the door frame at the height of your head. ? Place your hands on the door frame at the height of your elbows. 3. Slowly move your weight onto your front foot until you feel a stretch across your chest and in the front of your shoulders. Keep your head and chest upright and keep your abdominal muscles tight. 4. Hold for __________ seconds. 5. To release the stretch, shift your weight to your back foot. Repeat __________ times. Complete this stretch __________ times a day. Exercise F:Extension, Standing 1. Stand and hold a broomstick, a cane, or a similar object behind your back. ? Your hands should be a little wider than shoulder-width apart. ? Your palms  should face away from your back. 2. Keeping your elbows straight and keeping your shoulder muscles relaxed, move the stick away from your body until you feel a stretch in your shoulder. ? Avoid shrugging your shoulders while you move the stick. Keep your shoulder blade tucked down toward the middle of your back. 3. Hold for __________ seconds. 4. Slowly return to the starting position. Repeat __________ times. Complete this exercise __________ times a day. STRENGTHENING EXERCISES These exercises build strength and endurance in your shoulder. Endurance is the ability to use your muscles for a long time, even after they get tired. Exercise G:External Rotation  1. Sit in a stable chair without armrests. 2. Secure an exercise band at elbow height on your left / right side. 3. Place a soft object, such as a folded towel or a small pillow, between your left / right upper arm and your body to move your elbow a few inches away (about 10 cm) from your side. 4. Hold the end of the band so it is tight and there is no slack. 5. Keeping your elbow  pressed against the soft object, move your left / right forearm out, away from your abdomen. Keep your body steady so only your forearm moves. 6. Hold for __________ seconds. 7. Slowly return to the starting position. Repeat __________ times. Complete this exercise __________ times a day. Exercise H:Shoulder Abduction  1. Sit in a stable chair without armrests, or stand. 2. Hold a __________ weight in your left / right hand, or hold an exercise band with both hands. 3. Start with your arms straight down and your left / right palm facing in, toward your body. 4. Slowly lift your left / right hand out to your side. Do not lift your hand above shoulder height unless your health care provider tells you that this is safe. ? Keep your arms straight. ? Avoid shrugging your shoulder while you do this movement. Keep your shoulder blade tucked down toward the middle of  your back. 5. Hold for __________ seconds. 6. Slowly lower your arm, and return to the starting position. Repeat __________ times. Complete this exercise __________ times a day. Exercise I:Shoulder Extension 1. Sit in a stable chair without armrests, or stand. 2. Secure an exercise band to a stable object in front of you where it is at shoulder height. 3. Hold one end of the exercise band in each hand. Your palms should face each other. 4. Straighten your elbows and lift your hands up to shoulder height. 5. Step back, away from the secured end of the exercise band, until the band is tight and there is no slack. 6. Squeeze your shoulder blades together as you pull your hands down to the sides of your thighs. Stop when your hands are straight down by your sides. Do not let your hands go behind your body. 7. Hold for __________ seconds. 8. Slowly return to the starting position. Repeat __________ times. Complete this exercise __________ times a day. Exercise J:Standing Shoulder Row 1. Sit in a stable chair without armrests, or stand. 2. Secure an exercise band to a stable object in front of you so it is at waist height. 3. Hold one end of the exercise band in each hand. Your palms should be in a thumbs-up position. 4. Bend each of your elbows to an "L" shape (about 90 degrees) and keep your upper arms at your sides. 5. Step back until the band is tight and there is no slack. 6. Slowly pull your elbows back behind you. 7. Hold for __________ seconds. 8. Slowly return to the starting position. Repeat __________ times. Complete this exercise __________ times a day. Exercise K:Shoulder Press-Ups  1. Sit in a stable chair that has armrests. Sit upright, with your feet flat on the floor. 2. Put your hands on the armrests so your elbows are bent and your fingers are pointing forward. Your hands should be about even with the sides of your body. 3. Push down on the armrests and use your arms to  lift yourself off of the chair. Straighten your elbows and lift yourself up as much as you comfortably can. ? Move your shoulder blades down, and avoid letting your shoulders move up toward your ears. ? Keep your feet on the ground. As you get stronger, your feet should support less of your body weight as you lift yourself up. 4. Hold for __________ seconds. 5. Slowly lower yourself back into the chair. Repeat __________ times. Complete this exercise __________ times a day. Exercise L: Wall Push-Ups  1. Stand so you are facing a  stable wall. Your feet should be about one arm-length away from the wall. 2. Lean forward and place your palms on the wall at shoulder height. 3. Keep your feet flat on the floor as you bend your elbows and lean forward toward the wall. 4. Hold for __________ seconds. 5. Straighten your elbows to push yourself back to the starting position. Repeat __________ times. Complete this exercise __________ times a day. This information is not intended to replace advice given to you by your health care provider. Make sure you discuss any questions you have with your health care provider. Document Released: 10/19/2005 Document Revised: 08/29/2016 Document Reviewed: 08/16/2015 Elsevier Interactive Patient Education  2018 [employer].

## 2017-11-13 NOTE — Progress Notes (Signed)
Chief Complaint  Patient presents with  . Shoulder Pain    left shoulder pain and upper arm pain x 1 month. Hurts mostly when she raises her arm.    She hurt her left shoulder over a month ago.  It has gradually gotten worse.  She blames her heavy purse. This started prior to furniture market, had to walk far, carrying heavy purse.  Sometimes she would help move rugs, move some furniture--no known acute injury. It hurts to lift the arm, and it sometimes the pain travels down the upper arm.  Denies neck pain, numbness, tingling, weakness.    She tried aspercreme, heating pad and some home exercises (very little). She is on blood thinners, and cannot take anti-inflammatories  PMH, PSH, SH reviewed    Outpatient Encounter Medications as of 11/13/2017  Medication Sig  . aspirin EC 81 MG tablet Take 1 tablet (81 mg total) by mouth daily.  . B Complex Vitamins (B-COMPLEX/B-12 PO) Take 1 tablet by mouth daily.    . Calcium-Magnesium-Zinc 500-250-12.5 MG TABS Take 1 tablet by mouth daily.  . Cholecalciferol (VITAMIN D) 2000 units tablet Take 2,000 Units by mouth daily.  . Coenzyme Q10 (COQ10) 100 MG CAPS Take 1 tablet by mouth daily.   [name] diltiazem (CARDIZEM LA) 240 MG 24 hr tablet Take 1 tablet (240 mg total) by mouth daily.  [name] levothyroxine (SYNTHROID, LEVOTHROID) 50 MCG tablet TAKE 1 TABLET (50 MCG TOTAL) BY MOUTH DAILY.  . Polyvinyl Alcohol-Povidone (REFRESH OP) Place 1 drop into both eyes daily.  . Probiotic Product (PROBIOTIC PO) Take 1 capsule by mouth daily.  . Red Yeast Rice Extract (RED YEAST RICE PO) Take by mouth daily.  . rivaroxaban (XARELTO) 20 MG TABS tablet Take 1 tablet (20 mg total) by mouth daily with supper.  . valsartan-hydrochlorothiazide (DIOVAN-HCT) 320-25 MG tablet TAKE 1 TABLET BY MOUTH DAILY.  . nitroGLYCERIN (NITROSTAT) 0.4 MG SL tablet Place 1 tablet (0.4 mg total) under the tongue every 5 (five) minutes as needed for chest pain. (Patient not taking: Reported on  11/13/2017)   No facility-administered encounter medications on file as of 11/13/2017.    Allergies  Allergen Reactions  . Atorvastatin Other (See Comments)    Muscle aches  . Celebrex [Celecoxib] Other (See Comments)    Muscle aches  . Nitrofurantoin Nausea Only and Rash    ROS: no fever, chills, URI symptoms, chest pain, shortness of breath, nausea, vomiting, diarrhea.afib, asymptomatic. Denies bleeding on xarelto.  Always has some slight right knee pain.  L shoulder pain as per HPI  PHYSICAL EXAM:  BP 140/78   Pulse 88   Ht 5\' 3"  (1.6 m)   Wt 164 lb (74.4 kg)   BMI 29.05 kg/m   Well appearing, pleasant female, in moderate discomfort with certain arm movements, comfortable at rest Left arm--nontender to palpation around shoulder, no swelling, erythema, warmth. Limited abduction due to pain, only active ROM to 95 degrees Forward flexion 90 degrees. Very limited internal rotation, due to pain Also has pain with internal rotation against resistance.  Full strength. No other pain with other strength testing +impingment.  ASSESSMENT/PLAN:   Tendinitis of left rotator cuff - cannot take NSAIDs due to blood-thinners. Discussed oral steroids, PT vs injection--prefers latter; had improvement. Suspect calcific tendonitis/frozen shoulder - Plan: PR DRAIN/INJECT LARGE JOINT/BURSA  Shoulder pain, unspecified chronicity, unspecified laterality - Plan: triamcinolone acetonide (KENALOG-40) injection 40 mg, lidocaine (XYLOCAINE) 2 % (with pres) injection 80 mg, bupivacaine (MARCAINE) 0.5 % (with  pres) injection 50 mL  Acute pain of left shoulder - Plan: PR DRAIN/INJECT LARGE JOINT/BURSA    Prefers trial of cortisone shot (physical therapy as a second-line if not better, prefers not to try first due to cost). May need ortho referral and/or imaging if not improving.  Risks/benefits and alternatives to steroid injection were reviewed, and verbal consent obtained.  30mg  of kenalog, along  with 4cc lidocaine 2% without epi and 4cc marcaine were injection into the left subacromial bursa without complication.  She had improvement in her pain, but still had limited range of motion  Shown ROM exercises to be done daily.  F/u 2-3 weeks if not improved (vs call for PT referral).    Use tylenol as needed for pain. You may continue to use biofreeze/icy hot or other topical medication for pain, if needed. Work on the range of motion exercises as shown (circles leaning forward, walking arm up the wall front and sideways, and reaching back like putting on a bra).  If you aren't improving, next steps would be physical therapy and/or orthopedic evaluation.

## 2017-11-14 NOTE — Progress Notes (Signed)
Yes, the charges did drop

## 2017-11-17 ENCOUNTER — Telehealth: Payer: Self-pay | Admitting: *Deleted

## 2017-11-17 NOTE — Telephone Encounter (Signed)
CPAP supply order signed by Dr. Kelly and returned to [facility] 

## 2017-11-29 NOTE — Progress Notes (Signed)
Chief Complaint  Patient presents with  . Medicare Wellness    fasting AWV/CPE with pelvic. Just had eye exam. Only concerns is shoulder is not 100% better.     Theresa Mccarty is a 76 y.o. female who presents for annual physical exam, Medicare wellness visit and follow-up on chronic medical conditions.   She had cortisone shot to her left shoulder for rotator cuff tendonitis on 11/13/17. She is sleeping a little better at night (has insomnia, pain no longer keeps her awake), but still has pain with movement. She reports it is at least 50% better. She has the most pain with internal rotation. She is not yet ready for PT, wanting to give it a little more time.  She had UTI  In August (diagnosed and treated with Keflex per [facility]); Per notes available in computer, culture was negative. She reports she saw Dr. Alinda Money in follow-up afterwards, said culture from him showed infection, and she was treated with a different antibiotic. She currently has no urinary complaints.   Hypothyroidism: Compliant with taking thyroid medications on an empty stomach, separate from other medications. She feels well, denies any thyroid-related symptoms. No hair/skin/bowel/mood/energy/weight changes. Lab Results  Component Value Date   TSH 1.320 05/02/2017   H/o Renal Cell Cancer, treated in 07/2010 with cryoablation.  She last saw Dr. Alinda Money in August 2018 (no notes received), this was when she had urinary infection.   Last MRI (2017) didn't show any recurrence or metastases. IMPRESSION: Stable post cryoablation changes in upper pole of right kidney. No evidence of locally recurrent or metastatic carcinoma. Bilateral benign Bosniak category 1 and 2 renal cysts. No other solid or enhancing renal masses identified. Stable hepatic steatosis and small hepatic cysts. Stable tiny cysts in pancreatic body and tail, largest measuring 1.3 cm, which are likely benign. Recommend continued attention on follow-up imaging  in 1-2 years.   Plans to f/u next year (was told by Dr. Alinda Money to f/u in 2 years on this).  Hypertension follow-up: Blood pressures are running lowest of 124/78 (rarely that low), has been running mostly 135-140/85-90 more recently, since closer to the holidays, not exercising as regularly.  She forgot to bring her paper where she recorded BP's.  She plans to bring her BP list to her f/u with [name] on Monday.  Exercise hasn't been as regular since furniture market ended. Denies dizziness, headaches, chest pain. Denies side effects of medications.  CAD, hyperlipidemia and atrial fibrillation: Sees [name], and has f/u scheduled next week).  CAD, s/p PCI of proximal LAD in 2008. [name] also manages her lipids and atrial fibrillation.  Last seen in 05/2017.  Failed cardioversion in 04/2017. She is on Diltiazem and Xarelto.  She denies tachycardia, palpitations (only rarely feels "a little bit", chest pain, shortness of breath. She denies significant bleeding or bruising (bruises easily, not any different). She was having trouble tolerating atorvastatin due to calf pain, and ultimately she changed to red yeast rice.  She had lipids checked by him on the [facility], and was felt to be acceptable. Last checked a year ago. She continues on red yeast rice.  She has been having more eggs than usual (3-4/week), and has red meat (sausage or Canadian bacon) with the eggs, occasional steak. Admits too much bread/pasta. She is worried her labs won't be as good this year. Lab Results  Component Value Date   CHOL 211 (H) 11/28/2016   HDL 110 11/28/2016   LDLCALC 90 11/28/2016  TRIG 54 11/28/2016   CHOLHDL 1.9 11/28/2016   OSA--on CPAP; reports compliance with use of CPAP. Denies significant daytime somnolence (except when she has a poor night sleep due to insomnia).  Osteoporosis: Previously treated with fosamax and Boniva. She has been off meds for at least 7-8 years. She doesn't recall why the medication  was stopped, denies side effects (made her tired/draggy?). Her DEXA in 2009 was while still taking Boniva, and she had another DEXA 10/2012 which showed T-2.4 and decline in bone density at the left hip. It doesn't appear that she had the f/u DEXA as has been recommended at her last physicals. She states that [facility] will no longer take her insurance. She is also past due for mammogram.  Immunization History  Administered Date(s) Administered  . Influenza, High Dose Seasonal PF 10/16/2013, 12/01/2014, 09/24/2015, 08/18/2016, 08/30/2017  . Pneumococcal Conjugate-13 08/06/2014  . Pneumococcal Polysaccharide-23 08/09/2012  . Td 01/04/2006  . Tdap 08/09/2012  . Zoster 02/16/2014    Last Pap smear: 2009; S/p hysterectomy in '75  Last mammogram: 12/2011 (recommended yearly by [place], hasn't gotten) Last colonoscopy: 07/26/11  Last DEXA: 10/2012 at [place]. T-2.4 at L femur, with significant decline at L hip since 2009 DEXA Dentist: past due, many years. She finally has dental coverage and plans to schedule Ophtho: recent.  Has cataracts and got glasses Exercise: Treadmill 35 minutes at least 5x/week; 2.5 mph until October.  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 Alinda Money (urology) and Dr. Kathlene Cote [name] (cardiology) Dr. Claiborne Billings for OSA (hasn't seen in a while) [facility] at [facility]. Dr. [name] (ophtho) [employer] for hearing (had hearing aids) Doesn't have a dentist  Depression screen: negative Fall screen: none Functional Status survey: See screen in epic. +hearing aid, some issues with her glasses/vision (has cataracts).  End of Life Discussion: Patient hasa living will and medical power of attorney, but hasn't yet gotten it notarized. Plans to do at the bank soon and drop it off.  Past  Medical History:  Diagnosis Date  . Atrial fibrillation ([place])    a. initially diagnosed in 02/2017. Started on Eliquis  . Back pain, chronic   . Broken leg 1958  . CAD (coronary artery disease)    a. s/p DES to LAD in 2008, patent by cath in 2012.  [name] Hearing loss    bilateral hearing aids  . History of echocardiogram 09/11/2007   normal  . History of nuclear stress test 11/17/2010   exercise; small fixed anteroseptal defect (?artifact); short run of atrial-tachy during recovery; low risk  . Hyperlipidemia   . Hypertension   . Hypothyroid   . Impaired fasting glucose   . Osteoporosis   . Renal cell carcinoma right; 2011   Dr. Alinda Money, [facility]  . Sleep apnea    on CPAP    Past Surgical History:  Procedure Laterality Date  . ABDOMINAL HYSTERECTOMY  1975   PARTIAL; part of 1 ovary remains  . CARDIAC CATHETERIZATION  10/08/2011   LAD-prox stent patent; no significant obstructive CAD; hi LVF end-diastolic pressure  . CARDIOVERSION N/A 05/12/2017   Procedure: CARDIOVERSION;  Surgeon: [name], MD;  Location: [facility] ENDOSCOPY;  Service: Cardiovascular;  Laterality: N/A;  . CORONARY ANGIOPLASTY WITH STENT PLACEMENT  2008   mid LAD  . IR GENERIC HISTORICAL  08/18/2016   IR RADIOLOGIST EVAL & MGMT 08/18/2016 [name], MD [facility] INTERV RAD  . KIDNEY SURGERY  2011   renal carcinoma - cryoablation -  Dr. Alinda Money and Dr. Laural Roes (cryoablation)  . SPINE SURGERY      Social History   Socioeconomic History  . Marital status: Married    Spouse name: Not on file  . Number of children: 2  . Years of education: Not on file  . Highest education level: Not on file  Social Needs  . Financial resource strain: Not on file  . Food insecurity - worry: Not on file  . Food insecurity - inability: Not on file  . Transportation needs - medical: Not on file  . Transportation needs - non-medical: Not on file  Occupational History  . Occupation: retired Teacher, music: [employer]  Tobacco Use  . Smoking status: Former Smoker    Last attempt to quit: 12/19/1968    Years since quitting: 48.9  . Smokeless tobacco: Never Used  Substance and Sexual Activity  . Alcohol use: Yes    Alcohol/week: 1.2 oz    Types: 2 Glasses of wine per week    Comment: 0-3 glasses of wine per day. Trying to cut back on overall consumption (used to have 2/d)  . Drug use: No  . Sexual activity: Not Currently  Other Topics Concern  . Not on file  Social History Narrative   Lives with husband.  2 sons live near [place]. Retired from working for [employer] 02/2016. 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    Family History  Problem Relation Age of Onset  . Heart disease Mother   . Hypertension Mother   . Arthritis Mother   . Osteoporosis Mother   . Depression Sister   . Hyperlipidemia Sister   . Heart disease Father   . Diabetes Brother   . Cancer Sister        unsure, related to smoking?  . Cancer Sister        lung cancer  . Hyperlipidemia Brother   . Hypertension Brother     Outpatient Encounter Medications as of 11/30/2017  Medication Sig Note  . aspirin EC 81 MG tablet Take 1 tablet (81 mg total) by mouth daily.   . B Complex Vitamins (B-COMPLEX/B-12 PO) Take 1 tablet by mouth daily.     . Calcium-Magnesium-Zinc 500-250-12.5 MG TABS Take 1 tablet by mouth daily.   . Cholecalciferol (VITAMIN D) 2000 units tablet Take 2,000 Units by mouth daily.   . Coenzyme Q10 (COQ10) 100 MG CAPS Take 1 tablet by mouth daily.    [name] diltiazem (CARDIZEM LA) 240 MG 24 hr tablet Take 1 tablet (240 mg total) by mouth daily.   [name] levothyroxine (SYNTHROID, LEVOTHROID) 50 MCG tablet TAKE 1 TABLET (50 MCG TOTAL) BY MOUTH DAILY.   . Polyvinyl Alcohol-Povidone (REFRESH OP) Place 1 drop into both eyes daily.   . Probiotic Product (PROBIOTIC PO) Take 1 capsule by mouth daily.   . Red Yeast Rice Extract (RED YEAST RICE  PO) Take by mouth daily. 11/30/2017: Takes 2/day  . rivaroxaban (XARELTO) 20 MG TABS tablet Take 1 tablet (20 mg total) by mouth daily with supper.   . valsartan-hydrochlorothiazide (DIOVAN-HCT) 320-25 MG tablet TAKE 1 TABLET BY MOUTH DAILY.   . nitroGLYCERIN (NITROSTAT) 0.4 MG SL tablet Place 1 tablet (0.4 mg total) under the  tongue every 5 (five) minutes as needed for chest pain. (Patient not taking: Reported on 11/13/2017)    No facility-administered encounter medications on file as of 11/30/2017.     Allergies  Allergen Reactions  . Atorvastatin Other (See Comments)    Muscle aches  . Celebrex [Celecoxib] Other (See Comments)    Muscle aches  . Nitrofurantoin Nausea Only and Rash    ROS: The patient denies anorexia, fever, headaches, vision changes, ear pain, sore throat, breast concerns, chest pain, palpitations, dizziness, syncope, dyspnea on exertion, cough, swelling, nausea, vomiting, diarrhea, constipation, abdominal pain, melena, hematochezia, indigestion/heartburn, hematuria, incontinence, dysuria, vaginal bleeding, discharge, odor or itch, genital lesions, new joint pains (left shoulder per HPI), numbness, tingling, weakness, tremor, suspicious skin lesions, depression, anxiety, abnormal bleeding or enlarged lymph nodes. +easy bruising (unchanged) +hearing loss -- has hearing aids. Slight rash in pubic area--healing; gets periodically, often when stressed. Mild allergies with weather changes, no significant symptoms currently.   PHYSICAL EXAM:  BP (!) 150/88   Pulse 92   Ht _0  (1.575 m)   Wt 159 lb 9.6 oz (72.4 kg)   BMI 29.19 kg/m   Wt Readings from Last 3 Encounters:  11/30/17 159 lb 9.6 oz (72.4 kg)  11/13/17 164 lb (74.4 kg)  06/16/17 167 lb 3.2 oz (75.8 kg)   146/86 on repeat by MD  General Appearance:  Alert, cooperative, no distress, appears stated age   Head:  Normocephalic, without obvious abnormality, atraumatic   Eyes:  PERRL,  conjunctiva/corneas clear, EOM's intact, fundi benign. Mild ptosis of upper lid on the right.  Ears:  Normal TM's and EAC's; no cerumen  Nose:  Nares normal, mucosa normal, no drainage or sinus tenderness   Throat:  Lips, mucosa, and tongue normal; teeth and gums normal   Neck:  Supple, no lymphadenopathy; thyroid: no enlargement/tenderness/nodules; no carotid bruit or JVD   Back:  Spine nontender, no curvature, ROM normal, no CVA tenderness   Lungs:  Clear to auscultation bilaterally without wheezes, rales or ronchi; respirations unlabored   Chest Wall:  No tenderness or deformity   Heart:  Irregularly irregular, normal rate, S1 and S2 normal, no murmur, rub or gallop   Breast Exam:  No tenderness, masses, or nipple discharge or inversion. No axillary lymphadenopathy   Abdomen:  Soft, non-tender, nondistended, normoactive bowel sounds, no masses, no hepatosplenomegaly   Genitalia:  Normal external genitalia without lesions, some atrophic changes noted. BUS and vagina normal. No abnormal vaginal discharge. Uterus surgically absent and adnexa not palpable--nontender, no masses. Pap not performed   Rectal:  Normal tone, no masses or tenderness; guaiac negative stool   Extremities:  No clubbing, cyanosis or edema. Full range of motion with left arm, but painful starting at about 100 degrees abduction, and pain with internal rotation.  Pulses:  2+ and symmetric all extremities   Skin:  Skin color, texture, turgor normal. Actinic changes to skin throughout.  3 scabbed lesions, horizontally in pubic area, nontender; healing. No erythema or drainage. +many small ecchymosis on hands/forearms and lower legs.  Lymph nodes:  Cervical, supraclavicular, and axillary nodes normal   Neurologic:  CNII-XII intact, normal strength, sensation and gait; reflexes 2+ and symmetric throughout                         Psych: Normal mood, affect, hygiene and  grooming   ASSESSMENT/PLAN:  Annual physical exam - Plan: POCT Urinalysis DIP (Proadvantage Device)  Medicare  annual wellness visit, subsequent  Essential hypertension, benign - elevated today; discussed low sodium diet, resuming regular exercise. Bring list of BP's to cardiology appt on Monday - Plan: Comprehensive metabolic panel  Pure hypercholesterolemia - intolerant of atorvastatin, previously at goal on red yeast rice. Due for recheck. Send results to [name] - Plan: Lipid panel  Osteoporosis, unspecified osteoporosis type, unspecified pathological fracture presence - past due for f/u DEXA. No longer can go to [place]; pt advised to schedule both DEXA and mammo at breast center - Plan: DG Bone Density  OSA on CPAP - compliant  Coronary artery disease due to lipid rich plaque - asymptomatic/stable  Tendinitis of left rotator cuff - partly improved with cortisone shot; prefers to wait until January for physical therapy, if has persistent pain  Hypothyroidism, unspecified type - euthyroid by history - Plan: TSH  Persistent atrial fibrillation (HCC) - rate controlled, on anticoagulation  Renal cell carcinoma of right kidney ([place]) - discussed f/u screening--plans for f/u next summer  CAD S/P percutaneous coronary angioplasty  Medication monitoring encounter - Plan: Comprehensive metabolic panel, Lipid panel, CBC with Differential/Platelet, TSH  Encounter for current long-term use of anticoagulants - Plan: CBC with Differential/Platelet  Impaired fasting glucose - counseled re: exercise, low carb diet, weight loss - Plan: Comprehensive metabolic panel, Hemoglobin A1c   Prefers to hold off until January to be referred for PT for her left shoulder She will calll [place] in January (declines referral to be done now).  c-met, lipids, TSH, CBC  Discussed monthly self breast exams and yearly mammograms (past due); at least 30 minutes of aerobic activity at least 5 days/week,  weight-bearing exercise 2x/week; proper sunscreen use reviewed; healthy diet, including goals of calcium and vitamin D intake and alcohol recommendations (less than or equal to 1 drink/day) reviewed; regular seatbelt use; changing batteries in smoke detectors. Immunization recommendations UTD, continue yearly high dose flu shots. Recommended Shingrix, counseled re: side effects/efficacy. Colonoscopy recommendations reviewed, UTD  MR Abdomen due again 07/2017-2019  Osteoporosis--s/p treatment with bisphosphonate,  Some decline since stopping, and is past due for recheck. Advised to schedule DEXA and mammogram at [facility] (where she previously had)--advised of same last year, but never scheduled. Discussed calcium, vitamin D and weight-bearing exercise in detail  Living will, healthcare POA info again given, and discussed, needs to get notarized. Full Code, Full care MOST form discussed and filled out.  Cut back on alcohol to just 1 glass of wine/alcoholic beverage daily  SEND RESULTS TO DR HILTY--APPT Monday  F/u 1 year, may need 6 month if labs abnormal.    Your blood pressure was elevated today.  Please be sure to limit your sodium in your diet, get regular exercise, and continue to work on weight loss. Be sure to bring your list of blood pressures to your cardiology visit next week.    Medicare Attestation I have personally reviewed: The patient's medical and social history Their use of alcohol, tobacco or illicit drugs Their current medications and supplements The patient's functional ability including ADLs,fall risks, home safety risks, cognitive, and hearing and visual impairment Diet and physical activities Evidence for depression or mood disorders  The patient's weight, height and BMI have been recorded in the chart.  I have made referrals, counseling, and provided education to the patient based on review of the above and I have provided the patient with a written  personalized care plan for preventive services.

## 2017-11-30 ENCOUNTER — Ambulatory Visit (INDEPENDENT_AMBULATORY_CARE_PROVIDER_SITE_OTHER): Payer: Medicare Other | Admitting: Family Medicine

## 2017-11-30 ENCOUNTER — Encounter: Payer: Self-pay | Admitting: Family Medicine

## 2017-11-30 VITALS — BP 146/86 | HR 92 | Ht 62.0 in | Wt 159.6 lb

## 2017-11-30 DIAGNOSIS — M7582 Other shoulder lesions, left shoulder: Secondary | ICD-10-CM | POA: Diagnosis not present

## 2017-11-30 DIAGNOSIS — M810 Age-related osteoporosis without current pathological fracture: Secondary | ICD-10-CM | POA: Diagnosis not present

## 2017-11-30 DIAGNOSIS — I481 Persistent atrial fibrillation: Secondary | ICD-10-CM | POA: Diagnosis not present

## 2017-11-30 DIAGNOSIS — I2510 Atherosclerotic heart disease of native coronary artery without angina pectoris: Secondary | ICD-10-CM

## 2017-11-30 DIAGNOSIS — Z0000 Encounter for general adult medical examination without abnormal findings: Secondary | ICD-10-CM | POA: Diagnosis not present

## 2017-11-30 DIAGNOSIS — E7800 Pure hypercholesterolemia, unspecified: Secondary | ICD-10-CM

## 2017-11-30 DIAGNOSIS — I2583 Coronary atherosclerosis due to lipid rich plaque: Secondary | ICD-10-CM

## 2017-11-30 DIAGNOSIS — G4733 Obstructive sleep apnea (adult) (pediatric): Secondary | ICD-10-CM

## 2017-11-30 DIAGNOSIS — E039 Hypothyroidism, unspecified: Secondary | ICD-10-CM | POA: Diagnosis not present

## 2017-11-30 DIAGNOSIS — I10 Essential (primary) hypertension: Secondary | ICD-10-CM | POA: Diagnosis not present

## 2017-11-30 DIAGNOSIS — Z5181 Encounter for therapeutic drug level monitoring: Secondary | ICD-10-CM | POA: Diagnosis not present

## 2017-11-30 DIAGNOSIS — C641 Malignant neoplasm of right kidney, except renal pelvis: Secondary | ICD-10-CM | POA: Diagnosis not present

## 2017-11-30 DIAGNOSIS — R7301 Impaired fasting glucose: Secondary | ICD-10-CM | POA: Diagnosis not present

## 2017-11-30 DIAGNOSIS — Z7901 Long term (current) use of anticoagulants: Secondary | ICD-10-CM

## 2017-11-30 DIAGNOSIS — Z9861 Coronary angioplasty status: Secondary | ICD-10-CM

## 2017-11-30 DIAGNOSIS — Z9989 Dependence on other enabling machines and devices: Secondary | ICD-10-CM

## 2017-11-30 DIAGNOSIS — I4819 Other persistent atrial fibrillation: Secondary | ICD-10-CM

## 2017-11-30 LAB — POCT URINALYSIS DIP (PROADVANTAGE DEVICE)
Bilirubin, UA: NEGATIVE
Blood, UA: NEGATIVE
Glucose, UA: NEGATIVE mg/dL
Ketones, POC UA: NEGATIVE mg/dL
LEUKOCYTES UA: NEGATIVE
NITRITE UA: NEGATIVE
PROTEIN UA: NEGATIVE mg/dL
Specific Gravity, Urine: 1.015
UUROB: NEGATIVE
pH, UA: 7.5 (ref 5.0–8.0)

## 2017-11-30 NOTE — Patient Instructions (Addendum)
HEALTH MAINTENANCE RECOMMENDATIONS:  It is recommended that you get at least 30 minutes of aerobic exercise at least 5 days/week (for weight loss, you may need as much as 60-90 minutes). This can be any activity that gets your heart rate up. This can be divided in 10-15 minute intervals if needed, but try and build up your endurance at least once a week.  Weight bearing exercise is also recommended twice weekly.  Eat a healthy diet with lots of vegetables, fruits and fiber.  "Colorful" foods have a lot of vitamins (ie green vegetables, tomatoes, red peppers, etc).  Limit sweet tea, regular sodas and alcoholic beverages, all of which has a lot of calories and sugar.  Up to 1 alcoholic drink daily may be beneficial for women (unless trying to lose weight, watch sugars).  Drink a lot of water.  Calcium recommendations are 1200-1500 mg daily (1500 mg for postmenopausal women or women without ovaries), and vitamin D 1000 IU daily.  This should be obtained from diet and/or supplements (vitamins), and calcium should not be taken all at once, but in divided doses.  Monthly self breast exams and yearly mammograms for women over the age of 24 is recommended.  Sunscreen of at least SPF 30 should be used on all sun-exposed parts of the skin when outside between the hours of 10 am and 4 pm (not just when at beach or pool, but even with exercise, golf, tennis, and yard work!)  Use a sunscreen that says "broad spectrum" so it covers both UVA and UVB rays, and make sure to reapply every 1-2 hours.  Remember to change the batteries in your smoke detectors when changing your clock times in the spring and fall.  Use your seat belt every time you are in a car, and please drive safely and not be distracted with cell phones and texting while driving.   Theresa Mccarty , Thank you for taking time to come for your Medicare Wellness Visit. I appreciate your ongoing commitment to your health goals. Please review the following  plan we discussed and let me know if I can assist you in the future.   These are the goals we discussed: Goals    None      This is a list of the screening recommended for you and due dates:  Health Maintenance  Topic Date Due  . Tetanus Vaccine  08/09/2022  . Flu Shot  Completed  . DEXA scan (bone density measurement)  Completed  . Pneumonia vaccines  Completed   You are due for bone density test and mammogram.  [facility] and schedule both of these (since [facility] no longer takes your insurance).  You should get prior films sent for comparison. They won't be able to directly compare the bone density.  I recommend getting the new shingles vaccine (Shingrix). You will need to check with your insurance to see if it is covered, and if covered by Medicare Part D, you need to get from the pharmacy rather than our office.  It is a series of 2 injections, spaced 2 months apart.  Please call to schedule routine dental exam.  Drop off copies of your notarized Living Will and healthcare POA when you have them.  You should be due to follow up with Dr. Norville Haggard for imaging (MRI abdomen);  this summer will be 2 years.   Your blood pressure was elevated today.  Please be sure to limit your sodium in your diet, get  regular exercise, and continue to work on weight loss. Be sure to bring your list of blood pressures to your cardiology visit next week.     Low-Sodium Eating Plan Sodium, which is an element that makes up salt, helps you maintain a healthy balance of fluids in your body. Too much sodium can increase your blood pressure and cause fluid and waste to be held in your body. Your health care provider or dietitian may recommend following this plan if you have high blood pressure (hypertension), kidney disease, liver disease, or heart failure. Eating less sodium can help lower your blood pressure, reduce swelling, and protect your heart, liver, and kidneys. What are  tips for following this plan? General guidelines  Most people on this plan should limit their sodium intake to 1,500-2,000 mg (milligrams) of sodium each day. Reading food labels  The Nutrition Facts label lists the amount of sodium in one serving of the food. If you eat more than one serving, you must multiply the listed amount of sodium by the number of servings.  Choose foods with less than 140 mg of sodium per serving.  Avoid foods with 300 mg of sodium or more per serving. Shopping  Look for lower-sodium products, often labeled as "low-sodium" or "no salt added."  Always check the sodium content even if foods are labeled as "unsalted" or "no salt added".  Buy fresh foods. ? Avoid canned foods and premade or frozen meals. ? Avoid canned, cured, or processed meats  Buy breads that have less than 80 mg of sodium per slice. Cooking  Eat more home-cooked food and less restaurant, buffet, and fast food.  Avoid adding salt when cooking. Use salt-free seasonings or herbs instead of table salt or sea salt. Check with your health care provider or pharmacist before using salt substitutes.  Cook with plant-based oils, such as canola, sunflower, or olive oil. Meal planning  When eating at a restaurant, ask that your food be prepared with less salt or no salt, if possible.  Avoid foods that contain MSG (monosodium glutamate). MSG is sometimes added to [place] food, bouillon, and some canned foods. What foods are recommended? The items listed may not be a complete list. Talk with your dietitian about what dietary choices are best for you. Grains Low-sodium cereals, including oats, puffed wheat and rice, and shredded wheat. Low-sodium crackers. Unsalted rice. Unsalted pasta. Low-sodium bread. Whole-grain breads and whole-grain pasta. Vegetables Fresh or frozen vegetables. "No salt added" canned vegetables. "No salt added" tomato sauce and paste. Low-sodium or reduced-sodium tomato and  vegetable juice. Fruits Fresh, frozen, or canned fruit. Fruit juice. Meats and other protein foods Fresh or frozen (no salt added) meat, poultry, seafood, and fish. Low-sodium canned tuna and salmon. Unsalted nuts. Dried peas, beans, and lentils without added salt. Unsalted canned beans. Eggs. Unsalted nut butters. Dairy Milk. Soy milk. Cheese that is naturally low in sodium, such as ricotta cheese, fresh mozzarella, or Swiss cheese Low-sodium or reduced-sodium cheese. Cream cheese. Yogurt. Fats and oils Unsalted butter. Unsalted margarine with no trans fat. Vegetable oils such as canola or olive oils. Seasonings and other foods Fresh and dried herbs and spices. Salt-free seasonings. Low-sodium mustard and ketchup. Sodium-free salad dressing. Sodium-free light mayonnaise. Fresh or refrigerated horseradish. Lemon juice. Vinegar. Homemade, reduced-sodium, or low-sodium soups. Unsalted popcorn and pretzels. Low-salt or salt-free chips. What foods are not recommended? The items listed may not be a complete list. Talk with your dietitian about what dietary choices are best for  you. Grains Instant hot cereals. Bread stuffing, pancake, and biscuit mixes. Croutons. Seasoned rice or pasta mixes. Noodle soup cups. Boxed or frozen macaroni and cheese. Regular salted crackers. Self-rising flour. Vegetables Sauerkraut, pickled vegetables, and relishes. Olives. [place] fries. Onion rings. Regular canned vegetables (not low-sodium or reduced-sodium). Regular canned tomato sauce and paste (not low-sodium or reduced-sodium). Regular tomato and vegetable juice (not low-sodium or reduced-sodium). Frozen vegetables in sauces. Meats and other protein foods Meat or fish that is salted, canned, smoked, spiced, or pickled. Bacon, ham, sausage, hotdogs, corned beef, chipped beef, packaged lunch meats, salt pork, jerky, pickled herring, anchovies, regular canned tuna, sardines, salted nuts. Dairy Processed cheese and  cheese spreads. Cheese curds. Blue cheese. Feta cheese. String cheese. Regular cottage cheese. Buttermilk. Canned milk. Fats and oils Salted butter. Regular margarine. Ghee. Bacon fat. Seasonings and other foods Onion salt, garlic salt, seasoned salt, table salt, and sea salt. Canned and packaged gravies. Worcestershire sauce. Tartar sauce. Barbecue sauce. Teriyaki sauce. Soy sauce, including reduced-sodium. Steak sauce. Fish sauce. Oyster sauce. Cocktail sauce. Horseradish that you find on the shelf. Regular ketchup and mustard. Meat flavorings and tenderizers. Bouillon cubes. Hot sauce and Tabasco sauce. Premade or packaged marinades. Premade or packaged taco seasonings. Relishes. Regular salad dressings. Salsa. Potato and tortilla chips. Corn chips and puffs. Salted popcorn and pretzels. Canned or dried soups. Pizza. Frozen entrees and pot pies. Summary  Eating less sodium can help lower your blood pressure, reduce swelling, and protect your heart, liver, and kidneys.  Most people on this plan should limit their sodium intake to 1,500-2,000 mg (milligrams) of sodium each day.  Canned, boxed, and frozen foods are high in sodium. Restaurant foods, fast foods, and pizza are also very high in sodium. You also get sodium by adding salt to food.  Try to cook at home, eat more fresh fruits and vegetables, and eat less fast food, canned, processed, or prepared foods. This information is not intended to replace advice given to you by your health care provider. Make sure you discuss any questions you have with your health care provider. Document Released: 05/27/2002 Document Revised: 11/28/2016 Document Reviewed: 11/28/2016 Elsevier Interactive Patient Education  2017 [employer].

## 2017-12-01 LAB — LIPID PANEL
CHOL/HDL RATIO: 2 (calc) (ref <5.0)
Cholesterol: 216 mg/dL — ABNORMAL HIGH (ref <200)
HDL: 108 mg/dL (ref >50)
LDL Cholesterol (Calc): 95 mg/dL (calc)
NON-HDL CHOLESTEROL (CALC): 108 mg/dL (ref <130)
TRIGLYCERIDES: 43 mg/dL (ref <150)

## 2017-12-01 LAB — COMPREHENSIVE METABOLIC PANEL
AG Ratio: 1.7 (calc) (ref 1.0–2.5)
ALT: 20 U/L (ref 6–29)
AST: 25 U/L (ref 10–35)
Albumin: 4.2 g/dL (ref 3.6–5.1)
Alkaline phosphatase (APISO): 64 U/L (ref 33–130)
BUN: 16 mg/dL (ref 7–25)
CHLORIDE: 94 mmol/L — AB (ref 98–110)
CO2: 26 mmol/L (ref 20–32)
CREATININE: 0.65 mg/dL (ref 0.60–0.93)
Calcium: 9.5 mg/dL (ref 8.6–10.4)
GLOBULIN: 2.5 g/dL (ref 1.9–3.7)
GLUCOSE: 94 mg/dL (ref 65–99)
Potassium: 4 mmol/L (ref 3.5–5.3)
SODIUM: 130 mmol/L — AB (ref 135–146)
TOTAL PROTEIN: 6.7 g/dL (ref 6.1–8.1)
Total Bilirubin: 0.8 mg/dL (ref 0.2–1.2)

## 2017-12-01 LAB — CBC WITH DIFFERENTIAL/PLATELET
BASOS PCT: 0.4 %
Basophils Absolute: 28 cells/uL (ref 0–200)
Eosinophils Absolute: 28 cells/uL (ref 15–500)
Eosinophils Relative: 0.4 %
HEMATOCRIT: 41.4 % (ref 35.0–45.0)
HEMOGLOBIN: 14.4 g/dL (ref 11.7–15.5)
LYMPHS ABS: 980 {cells}/uL (ref 850–3900)
MCH: 32.4 pg (ref 27.0–33.0)
MCHC: 34.8 g/dL (ref 32.0–36.0)
MCV: 93 fL (ref 80.0–100.0)
MPV: 10.2 fL (ref 7.5–12.5)
Monocytes Relative: 9 %
NEUTROS PCT: 76 %
Neutro Abs: 5244 cells/uL (ref 1500–7800)
Platelets: 313 10*3/uL (ref 140–400)
RBC: 4.45 10*6/uL (ref 3.80–5.10)
RDW: 12.1 % (ref 11.0–15.0)
Total Lymphocyte: 14.2 %
WBC: 6.9 10*3/uL (ref 3.8–10.8)
WBCMIX: 621 {cells}/uL (ref 200–950)

## 2017-12-01 LAB — HEMOGLOBIN A1C
Hgb A1c MFr Bld: 5.1 % of total Hgb (ref <5.7)
Mean Plasma Glucose: 100 (calc)
eAG (mmol/L): 5.5 (calc)

## 2017-12-01 LAB — TSH: TSH: 1.81 mIU/L (ref 0.40–4.50)

## 2017-12-04 ENCOUNTER — Ambulatory Visit: Payer: Medicare Other | Admitting: Internal Medicine

## 2017-12-04 ENCOUNTER — Encounter: Payer: Self-pay | Admitting: Internal Medicine

## 2017-12-04 VITALS — BP 147/91 | HR 96 | Ht 62.0 in | Wt 159.4 lb

## 2017-12-04 DIAGNOSIS — E785 Hyperlipidemia, unspecified: Secondary | ICD-10-CM

## 2017-12-04 DIAGNOSIS — I2510 Atherosclerotic heart disease of native coronary artery without angina pectoris: Secondary | ICD-10-CM | POA: Diagnosis not present

## 2017-12-04 DIAGNOSIS — I4819 Other persistent atrial fibrillation: Secondary | ICD-10-CM

## 2017-12-04 DIAGNOSIS — Z9861 Coronary angioplasty status: Secondary | ICD-10-CM

## 2017-12-04 DIAGNOSIS — I10 Essential (primary) hypertension: Secondary | ICD-10-CM | POA: Diagnosis not present

## 2017-12-04 DIAGNOSIS — I481 Persistent atrial fibrillation: Secondary | ICD-10-CM | POA: Diagnosis not present

## 2017-12-04 MED ORDER — EZETIMIBE 10 MG PO TABS: 10.0000 mg | ORAL_TABLET | Freq: Every day | ORAL | 3 refills | Status: DC

## 2017-12-04 NOTE — Progress Notes (Signed)
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 Recently, she developed substernal chest pain in the fall and underwent cardiac catheterization. This demonstrated patent LAD stent that was proximal as well as 30% disease of the LAD just prior to that stent, but no other areas of obstructive coronary disease. She continued to have chest pain; however, I felt it was most likely related to stress. I have also encouraged dietary changes as well as trying to reduce her stress and alcohol intake. She has accomplished that to some extent and, over the past 6 months has had no other symptoms of chest discomfort. She occasionally gets short of breath with heavy exertion and has some swelling in her legs only when standing up for long periods of time. She continues to work at [employer] although there is some stress she is actually maintaining her exercise regimen. This is clearly demonstrated improvement in her risk factors including a reduction in cholesterol. A recent lipid NMR demonstrated a particle number of 631 and an LDL cholesterol of 81.   Theresa Mccarty returns for annual followup today. She reports feeling very well. She recently had the sh[facility]les vaccine at [facility]. She had some questions today about starting coconut oil glucosamine/chondroitin which I think are fine. She did have one episode of discomfort in her chest after working in regard for which she took nitroglycerin however she noted no improvement in her symptoms. She is very interested in decreasing her Lipitor she does report some soreness in her arm muscles as well as her leg muscles. She believes this is related to the statin. She has not been on any other statin since her stent was placed in 2008. She is interested a recheck of her  cholesterol today as well as a screening hemoglobin A1c - she is nondiabetic.  Finally, she recently had a fall at home which was due to tripping over a table leg. She did have some soreness in her right hand as well as ecchymosis and swelling of her digits and 2 bandages on her right toes.  Theresa Mccarty has no specific complaints today. She denies any chest pain or worsening shortness of breath. Blood pressure is at goal. She continues to take aspirin and Plavix without any bleeding complications. Cholesterol is due for recheck. She does report some mild calf tenderness which she attributes to a atorvastatin.  06/13/2016  Theresa Mccarty returns today for follow-up. Over the past year she's done fairly well. She's had some struggles with arthritis. She noted that she was having some side effects related to atorvastatin including weakness in her arm which improved after discontinuing the medicine. She says she's had about 8 pound weight loss however we demonstrated only 1-2 pounds in the office. She is currently on red yeast rice and wishes to have a fasting lipid profile today. She denies any significant chest discomfort or worsening shortness of breath. She recently retired from her job and notes less stress in her life. Blood pressure is well-controlled today.  04/20/2017  Theresa Mccarty was seen today in follow-up. Recently she was seen by Kerin Ransom and had symptoms of worsening fatigue and palpitations. She was found to be in A. fib with RVR. He adjusted some medications for rate control including adding diltiazem and started her on Eliquis. She reports  she's been taking Eliquis for the past several weeks however did miss at least one dose possibly 2. Today heart rate is just below 100 and her rhythm is irregular. She reports she feels better but still is fatigued. We discussed the possibility of a cardioversion attempt. She understands she would need at least 3 weeks of uninterrupted anticoagulation or TEE guided approach  in order to do that. She feels that she could be more compliant with medication.  06/16/2017  Theresa Mccarty returns today for follow-up. She underwent cardioversion which was unsuccessful. Subsequently she said that she does not think that she was terribly symptomatic therefore we proceeded rate controlled. I increased her diltiazem and heart rate is well-controlled in the 80s today. She reports no significant fatigue. She is switched from Eliquis to Xarelto due to the fact that she had trouble remembering the second dose of Eliquis. Blood pressure was elevated somewhat initially 154/95 however came down to 138/88. She also reports some abdominal bloating but his recent started on probiotics. She gets some lower extremity swelling but has some evidence of venous insufficiency. She is on hydrochlorothiazide with Diovan.  12/04/2017  Theresa Mccarty was seen today in follow-up.  She continues to be without complaints.  She denies any chest pain or worsening shortness of breath.  She remains in persistent if not permanent atrial fibrillation.  She has been on Xarelto which is been easier for her to remember however she is concerned about the cost, particularly now that she is in the donut hole is causing her about $500 a month.  Blood pressure is pretty well controlled.  She showed me numbers at home indicating blood pressures between 175 and 102 systolic.  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  PMHx:  Past Medical History:  Diagnosis Date  . Atrial fibrillation ([place])    a. initially diagnosed in 02/2017. Started on Eliquis  . Back pain, chronic   . Broken leg 1958  . CAD (coronary artery disease)    a. s/p DES to LAD in 2008, patent by cath in 2012.  [name] Hearing loss    bilateral hearing aids  . History of echocardiogram 09/11/2007   normal  . History of nuclear stress test 11/17/2010   exercise; small fixed anteroseptal defect (?artifact); short run of  atrial-tachy during recovery; low risk  . Hyperlipidemia   . Hypertension   . Hypothyroid   . Impaired fasting glucose   . Osteoporosis   . Renal cell carcinoma right; 2011   Dr. Alinda Money, [facility]  . Sleep apnea    on CPAP    Past Surgical History:  Procedure Laterality Date  . ABDOMINAL HYSTERECTOMY  1975   PARTIAL; part of 1 ovary remains  . CARDIAC CATHETERIZATION  10/08/2011   LAD-prox stent patent; no significant obstructive CAD; hi LVF end-diastolic pressure  . CARDIOVERSION N/A 05/12/2017   Procedure: CARDIOVERSION;  Surgeon: [name], MD;  Location: [facility] ENDOSCOPY;  Service: Cardiovascular;  Laterality: N/A;  . CORONARY ANGIOPLASTY WITH STENT PLACEMENT  2008   mid LAD  . IR GENERIC HISTORICAL  08/18/2016   IR RADIOLOGIST EVAL & MGMT 08/18/2016 [name], MD [facility] INTERV RAD  . KIDNEY SURGERY  2011   renal carcinoma - cryoablation -  Dr. Alinda Money and Dr. Laural Roes (cryoablation)  . SPINE SURGERY      FAMHx:  Family History  Problem Relation Age of Onset  . Heart disease Mother   .  Hypertension Mother   . Arthritis Mother   . Osteoporosis Mother   . Depression Sister   . Hyperlipidemia Sister   . Heart disease Father   . Diabetes Brother   . Cancer Sister        unsure, related to smoking?  . Cancer Sister        lung cancer  . Hyperlipidemia Brother   . Hypertension Brother     SOCHx:   reports that she quit smoking about 48 years ago. she has never used smokeless tobacco. She reports that she drinks about 1.2 oz of alcohol per week. She reports that she does not use drugs.  ALLERGIES:  Allergies  Allergen Reactions  . Atorvastatin Other (See Comments)    Muscle aches  . Celebrex [Celecoxib] Other (See Comments)    Muscle aches  . Nitrofurantoin Nausea Only and Rash    ROS: Pertinent items noted in HPI and remainder of comprehensive ROS otherwise negative.  HOME MEDS: Current Outpatient Medications  Medication Sig Dispense Refill    . aspirin EC 81 MG tablet Take 1 tablet (81 mg total) by mouth daily. 90 tablet 3  . B Complex Vitamins (B-COMPLEX/B-12 PO) Take 1 tablet by mouth daily.      . Calcium-Magnesium-Zinc 500-250-12.5 MG TABS Take 1 tablet by mouth daily.    . Cholecalciferol (VITAMIN D) 2000 units tablet Take 2,000 Units by mouth daily.    . Coenzyme Q10 (COQ10) 100 MG CAPS Take 1 tablet by mouth daily.     [name] diltiazem (CARDIZEM LA) 240 MG 24 hr tablet Take 1 tablet (240 mg total) by mouth daily. 30 tablet 11  . levothyroxine (SYNTHROID, LEVOTHROID) 50 MCG tablet TAKE 1 TABLET (50 MCG TOTAL) BY MOUTH DAILY. 90 tablet 0  . nitroGLYCERIN (NITROSTAT) 0.4 MG SL tablet Place 1 tablet (0.4 mg total) under the tongue every 5 (five) minutes as needed for chest pain. 25 tablet 3  . Polyvinyl Alcohol-Povidone (REFRESH OP) Place 1 drop into both eyes daily.    . Probiotic Product (PROBIOTIC PO) Take 1 capsule by mouth daily.    . Red Yeast Rice Extract (RED YEAST RICE PO) Take by mouth daily.    . rivaroxaban (XARELTO) 20 MG TABS tablet Take 1 tablet (20 mg total) by mouth daily with supper. 28 tablet 0  . valsartan-hydrochlorothiazide (DIOVAN-HCT) 320-25 MG tablet TAKE 1 TABLET BY MOUTH DAILY. 90 tablet 2  . ezetimibe (ZETIA) 10 MG tablet Take 1 tablet (10 mg total) by mouth daily. 90 tablet 3   No current facility-administered medications for this visit.     LABS/IMAGING: No results found for this or any previous visit (from the past 48 hour(s)). No results found.  VITALS: BP (!) 147/91   Pulse 96   Ht 5\' 2"  (1.575 m)   Wt 159 lb 6.4 oz (72.3 kg)   BMI 29.15 kg/m   EXAM: General appearance: alert and no distress Neck: no carotid bruit and no JVD Lungs: clear to auscultation bilaterally Heart: irregularly irregular rhythm Abdomen: soft, non-tender; bowel sounds normal; no masses,  no organomegaly Extremities: extremities normal, atraumatic, no cyanosis or edema Pulses: 2+ and symmetric Skin: Skin color,  texture, turgor normal. No rashes or lesions Neurologic: Grossly normal Psych: Pleasant  EKG: She will fibrillation at 96-personally reviewed  ASSESSMENT: 1. Persistent atrial fibrillation with controlled ventricular response - failed cardioversion 2. CHADSVASC score of 4 on Xarelto 3. Coronary artery disease status post PCI of the proximal LAD in  2008 4. Hypertension - controlled 5. Hyperlipidemia - controlled 6. Obstructive sleep apnea on CPAP 7. Arthritis  PLAN: 1.   Theresa Mccarty is to be doing well and is asymptomatic with her atrial fibrillation which is long-standing persistent if not permanent.  Her CHADSVASC score is 4 and she is on Xarelto.  We will continue that as she seems to remember to take it daily.  She is not have any active coronary artery disease but does have a prior stent in 2008.  She has hypertension and dyslipidemia however recent lipid profile showed an LDL 95.  Her cholesterol has been persistently around this level with a goal LDL less than 70.  She was intolerant of atorvastatin and has been on red yeast rice with minimal benefits.  She understands that there is no proven cardiovascular benefit of this medication.  We do have recent data however with ezetimibe showing coronary artery benefit and it may be tolerated her to allow a 20-25% reduction in her cholesterol which would put her at goal.  We will plan to start ezetimibe 10 mg daily today and repeat a lipid profile in 3 months.  Follow-up with me afterwards in 6 months.  [name], MD, [facility], [place] Director of the Advanced Lipid Disorders &  Cardiovascular Risk Reduction Clinic Attending Cardiologist  Direct Dial: [phone]  Fax: [phone]  Website:  [email or web address] 12/04/2017, 12:56 PM

## 2017-12-04 NOTE — Patient Instructions (Signed)
Medication Instructions:   START zetia 10mg  once daily  Labwork:  FASTING lab work in 3 months to recheck cholesterol  Testing/Procedures:  NONE  Follow-Up:  Your physician recommends that you schedule a follow-up appointment in 6 months with [name].   If you need a refill on your cardiac medications before your next appointment, please call your pharmacy.  Any Other Special Instructions Will Be Listed Below (If Applicable).

## 2017-12-06 ENCOUNTER — Telehealth: Payer: Self-pay | Admitting: Internal Medicine

## 2017-12-06 NOTE — Telephone Encounter (Signed)
New message     Patient calling the office for samples of medication:   1.  What medication and dosage are you requesting samples for? Xarelto 20 mg   2.  Are you currently out of this medication?  Almost out

## 2017-12-06 NOTE — Telephone Encounter (Signed)
Patient aware samples are at the front desk for pick up  

## 2017-12-08 DIAGNOSIS — G4733 Obstructive sleep apnea (adult) (pediatric): Secondary | ICD-10-CM | POA: Diagnosis not present

## 2017-12-20 ENCOUNTER — Other Ambulatory Visit: Payer: Self-pay | Admitting: Family Medicine

## 2017-12-20 DIAGNOSIS — Z1231 Encounter for screening mammogram for malignant neoplasm of breast: Secondary | ICD-10-CM

## 2017-12-22 ENCOUNTER — Other Ambulatory Visit: Payer: Self-pay | Admitting: Internal Medicine

## 2017-12-22 DIAGNOSIS — I10 Essential (primary) hypertension: Secondary | ICD-10-CM

## 2017-12-22 DIAGNOSIS — Z0000 Encounter for general adult medical examination without abnormal findings: Secondary | ICD-10-CM

## 2017-12-22 DIAGNOSIS — E785 Hyperlipidemia, unspecified: Secondary | ICD-10-CM

## 2017-12-26 ENCOUNTER — Other Ambulatory Visit: Payer: Self-pay

## 2017-12-26 DIAGNOSIS — E785 Hyperlipidemia, unspecified: Secondary | ICD-10-CM

## 2017-12-26 DIAGNOSIS — Z0000 Encounter for general adult medical examination without abnormal findings: Secondary | ICD-10-CM

## 2017-12-26 DIAGNOSIS — I10 Essential (primary) hypertension: Secondary | ICD-10-CM

## 2017-12-26 MED ORDER — NITROGLYCERIN 0.4 MG SL SUBL
0.4000 mg | SUBLINGUAL_TABLET | SUBLINGUAL | 0 refills | Status: DC | PRN
Start: 2017-12-26 — End: 2018-01-08

## 2018-01-08 ENCOUNTER — Other Ambulatory Visit: Payer: Self-pay

## 2018-01-08 DIAGNOSIS — E785 Hyperlipidemia, unspecified: Secondary | ICD-10-CM

## 2018-01-08 DIAGNOSIS — Z0000 Encounter for general adult medical examination without abnormal findings: Secondary | ICD-10-CM

## 2018-01-08 DIAGNOSIS — I10 Essential (primary) hypertension: Secondary | ICD-10-CM

## 2018-01-08 MED ORDER — NITROGLYCERIN 0.4 MG SL SUBL: 0.4000 mg | SUBLINGUAL_TABLET | SUBLINGUAL | 1 refills | Status: DC | PRN

## 2018-01-10 ENCOUNTER — Telehealth: Payer: Self-pay

## 2018-01-10 DIAGNOSIS — E039 Hypothyroidism, unspecified: Secondary | ICD-10-CM

## 2018-01-10 MED ORDER — LEVOTHYROXINE SODIUM 50 MCG PO TABS: ORAL_TABLET | ORAL | 1 refills | Status: DC

## 2018-01-10 NOTE — Telephone Encounter (Signed)
Pharmacy is requesting levothyroxine 50 mcg last ov 11-30-17 which included labs. Pharmacy is [facility] [address] . Thanks [employer]

## 2018-01-10 NOTE — Telephone Encounter (Signed)
Done x 6 months.

## 2018-01-12 ENCOUNTER — Ambulatory Visit
Admission: RE | Admit: 2018-01-12 | Discharge: 2018-01-12 | Disposition: A | Discharge status: Home / Self Care | Payer: Medicare Other | Source: Ambulatory Visit | Attending: Family Medicine | Admitting: Family Medicine

## 2018-01-12 DIAGNOSIS — Z1231 Encounter for screening mammogram for malignant neoplasm of breast: Secondary | ICD-10-CM | POA: Diagnosis not present

## 2018-01-12 DIAGNOSIS — M810 Age-related osteoporosis without current pathological fracture: Secondary | ICD-10-CM

## 2018-01-12 DIAGNOSIS — Z780 Asymptomatic menopausal state: Secondary | ICD-10-CM | POA: Diagnosis not present

## 2018-02-04 NOTE — Progress Notes (Signed)
Chief Complaint  Patient presents with  . Follow-up    bone density consult.     Patient presents to discuss her bone density results from last month (12/2017).  DEXA showed osteoporosis, with T-2.7 at R fem neck. This was done at the [facility], a different location than prior DEXA's, due to [facility] no longer taking her insurance. Previously treated with fosamax and Boniva. She has been off meds for at least 7-8 years. She doesn't recall why the medication was stopped, denies side effects(made her tired/draggy?). Her DEXA in 2009 was while still taking Boniva, and she had another DEXA 10/2012 which showed T-2.4 and decline in bone density at the left hip.   She was started on Zetia by [name] in December.  Had some nausea for 2 weeks (also when her brother passed). She stopped med, felt better, re-tried it and hasn't had any nausea recur.  She DID see Dr. Alinda Money in August this past year.  Still has some left shoulder pain, certain movements cause it to "catch". Not all the time, just every now and then. Admits to not being very consistent with her home exercises. Reports the discomfort feels "like a steel rod" in her upper humerus on the left. Had injection in 10/2017. Helped within days. Current pain is not constant.  PMH, PSH, SH reviewed  Outpatient Encounter Medications as of 02/05/2018  Medication Sig Note  . aspirin EC 81 MG tablet Take 1 tablet (81 mg total) by mouth daily.   . B Complex Vitamins (B-COMPLEX/B-12 PO) Take 1 tablet by mouth daily.     . Calcium-Magnesium-Zinc 500-250-12.5 MG TABS Take 1 tablet by mouth daily.   . Cholecalciferol (VITAMIN D) 2000 units tablet Take 2,000 Units by mouth daily.   . Coenzyme Q10 (COQ10) 100 MG CAPS Take 1 tablet by mouth daily.    [name] diltiazem (CARDIZEM LA) 240 MG 24 hr tablet Take 1 tablet (240 mg total) by mouth daily.   [name] ezetimibe (ZETIA) 10 MG tablet Take 1 tablet (10 mg total) by mouth daily.   [name] levothyroxine (SYNTHROID,  LEVOTHROID) 50 MCG tablet TAKE 1 TABLET (50 MCG TOTAL) BY MOUTH DAILY.   . Polyvinyl Alcohol-Povidone (REFRESH OP) Place 1 drop into both eyes daily.   . Probiotic Product (PROBIOTIC PO) Take 1 capsule by mouth daily.   . Red Yeast Rice Extract (RED YEAST RICE PO) Take by mouth daily. 11/30/2017: Takes 2/day  . rivaroxaban (XARELTO) 20 MG TABS tablet Take 1 tablet (20 mg total) by mouth daily with supper.   . valsartan-hydrochlorothiazide (DIOVAN-HCT) 320-25 MG tablet TAKE 1 TABLET BY MOUTH DAILY.   [name] alendronate (FOSAMAX) 70 MG tablet Take 1 tablet (70 mg total) by mouth every 7 (seven) days. Take with a full glass of water on an empty stomach.   . nitroGLYCERIN (NITROSTAT) 0.4 MG SL tablet Place 1 tablet (0.4 mg total) under the tongue every 5 (five) minutes as needed for chest pain. (Patient not taking: Reported on 02/05/2018)    No facility-administered encounter medications on file as of 02/05/2018.    (alendronate rx'd today, not taking prior to visit)  Allergies  Allergen Reactions  . Atorvastatin Other (See Comments)    Muscle aches  . Celebrex [Celecoxib] Other (See Comments)    Muscle aches  . Nitrofurantoin Nausea Only and Rash    ROS: no fever, chills, headaches, dizziness, URI symptoms. Denies heartburn, dysphagia.  Left shoulder pain with certain movements per HPI. No chest pain, shortness  of breath. See HPI   PHYSICAL EXAM  BP 138/84   Pulse 92   Ht 5\' 2"  (1.575 m)   Wt 163 lb 6.4 oz (74.1 kg)   BMI 29.89 kg/m   Pleasant, overweight female in no distress Exam not performed, limited to discussion and counseling.  ASSESSMENT/PLAN:  Osteoporosis, unspecified osteoporosis type, unspecified pathological fracture presence - discussed risks/SE and how to take alendronate. to contact us if not tolerating, to consider change to monthly med vs Prolia - Plan: alendronate (FOSAMAX) 70 MG tablet  Tendinitis of left rotator cuff - significantly improved s/p injection, some  residual pain. Declines PT. Encouraged home exercises as previously given    Discussed bisphosphonates (weekly vs monthly; did not mention yearly infusion today) and Prolia injections. Discussed how to take (especially in relation to her thyroid medication and other medications), risks/side effects and potential differences in costs.  After thorough discussion, she would like to start alendronate weekly. Would be interested potentially in Prolia (if having side effects or issues) if it is affordable, but will start with alendronate. Counseled re: Ca, D, weight-bearing exercise.  Shoulder pain (L), much improved s/p injection, still some sharp discomfort with certain positions, not daily. Encouraged to do home exercises daily.  Declines referral for PT at this time.    We are going to start you on weekly alendronate. Remember to take this on an empty stomach with a full glass of water, and don't recline or eat for at least 30 minutes.  Be sure to take your thyroid medication 30 minutes prior to taking this alendronate. If you develop heartburn, chest pain, or other side effects, let us know. If you are having side effects that are somewhat bothersome, then the options include changing to a once a month medication (would have similar side effects, but less often).  These would be Boniva or Actonel.  Check with your insurance to see if there is a preferred or less expensive choice.  The other option we discussed was Prolia, which is the every 6 month injection given in our office.  If you are interested in this, we will have Juliann Pulse look into what your out of pocket costs may be. Just let us know.  Regardless of the medication used to help build your bones, you must remember that you need the building blocks in order for the medication to work--meaning calcium and vitamin D. Weight-bearing exercise at least 2x/week is also good for keeping bones strong.

## 2018-02-05 ENCOUNTER — Encounter: Payer: Self-pay | Admitting: Family Medicine

## 2018-02-05 ENCOUNTER — Ambulatory Visit (INDEPENDENT_AMBULATORY_CARE_PROVIDER_SITE_OTHER): Payer: Medicare Other | Admitting: Family Medicine

## 2018-02-05 VITALS — BP 138/84 | HR 92 | Ht 62.0 in | Wt 163.4 lb

## 2018-02-05 DIAGNOSIS — M7582 Other shoulder lesions, left shoulder: Secondary | ICD-10-CM

## 2018-02-05 DIAGNOSIS — M810 Age-related osteoporosis without current pathological fracture: Secondary | ICD-10-CM

## 2018-02-05 MED ORDER — ALENDRONATE SODIUM 70 MG PO TABS: 70.0000 mg | ORAL_TABLET | ORAL | 3 refills | Status: DC

## 2018-02-05 NOTE — Patient Instructions (Addendum)
We are going to start you on weekly alendronate. Remember to take this on an empty stomach with a full glass of water, and don't recline or eat for at least 30 minutes.  Be sure to take your thyroid medication 30 minutes prior to taking this alendronate. If you develop heartburn, chest pain, or other side effects, let us know. If you are having side effects that are somewhat bothersome, then the options include changing to a once a month medication (would have similar side effects, but less often).  These would be Boniva or Actonel.  Check with your insurance to see if there is a preferred or less expensive choice.  The other option we discussed was Prolia, which is the every 6 month injection given in our office.  If you are interested in this, we will have Juliann Pulse look into what your out of pocket costs may be. Just let us know.  Regardless of the medication used to help build your bones, you must remember that you need the building blocks in order for the medication to work--meaning calcium and vitamin D. Weight-bearing exercise at least 2x/week is also good for keeping bones strong.  Resume doing regular home exercises for the left shoulder to see if that helps with your pain.   Osteoporosis Osteoporosis happens when your bones become thinner and weaker. Weak bones can break (fracture) more easily when you slip or fall. Bones most at risk of breaking are in the hip, wrist, and spine. Follow these instructions at home:  Get enough calcium and vitamin D. These nutrients are good for your bones.  Exercise as told by your doctor.  Do not use any tobacco products. This includes cigarettes, chewing tobacco, and electronic cigarettes. If you need help quitting, ask your doctor.  Limit the amount of alcohol you drink.  Take medicines only as told by your doctor.  Keep all follow-up visits as told by your doctor. This is important.  Take care at home to prevent falls. Some ways to do this  are: ? Keep rooms well lit and tidy. ? Put safety rails on your stairs. ? Put a rubber mat in the bathroom and other places that are often wet or slippery. Get help right away if:  You fall.  You hurt yourself. This information is not intended to replace advice given to you by your health care provider. Make sure you discuss any questions you have with your health care provider. Document Released: 02/27/2012 Document Revised: 05/12/2016 Document Reviewed: 05/15/2014 Elsevier Interactive Patient Education  2018 [employer].  Alendronate weekly tablets What is this medicine? ALENDRONATE (a LEN droe nate) slows calcium loss from bones. It helps to make healthy bone and to slow bone loss in people with osteoporosis. It may be used to treat Paget's disease. This medicine may be used for other purposes; ask your health care provider or pharmacist if you have questions. COMMON BRAND NAME(S): Fosamax What should I tell my health care provider before I take this medicine? They need to know if you have any of these conditions: -esophagus, stomach, or intestine problems, like acid-reflux or GERD -dental disease -kidney disease -low blood calcium -low vitamin D -problems swallowing -problems sitting or standing for 30 minutes -an unusual or allergic reaction to alendronate, other medicines, foods, dyes, or preservatives -pregnant or trying to get pregnant -breast-feeding How should I use this medicine? You must take this medicine exactly as directed or you will lower the amount of medicine you absorb into your  body or you may cause yourself harm. Take your dose by mouth first thing in the morning, after you are up for the day. Do not eat or drink anything before you take this medicine. Swallow your medicine with a full glass (6 to 8 fluid ounces) of plain water. Do not take this tablet with any other drink. Do not chew or crush the tablet. After taking this medicine, do not eat breakfast, drink,  or take any medicines or vitamins for at least 30 minutes. Stand or sit up for at least 30 minutes after you take this medicine; do not lie down. Take this medicine on the same day every week. Do not take your medicine more often than directed. Talk to your pediatrician regarding the use of this medicine in children. Special care may be needed. Overdosage: If you think you have taken too much of this medicine contact a poison control center or emergency room at once. NOTE: This medicine is only for you. Do not share this medicine with others. What if I miss a dose? If you miss a dose, take the dose on the morning after you remember. Then take your next dose on your regular day of the week. Never take 2 tablets on the same day. Do not take double or extra doses. What may interact with this medicine? -aluminum hydroxide -antacids -aspirin -calcium supplements -drugs for inflammation like ibuprofen, naproxen, and others -iron supplements -magnesium supplements -vitamins with minerals This list may not describe all possible interactions. Give your health care provider a list of all the medicines, herbs, non-prescription drugs, or dietary supplements you use. Also tell them if you smoke, drink alcohol, or use illegal drugs. Some items may interact with your medicine. What should I watch for while using this medicine? Visit your doctor or health care professional for regular checks ups. It may be some time before you see benefit from this medicine. Do not stop taking your medication except on your doctor's advice. Your doctor or health care professional may order blood tests and other tests to see how you are doing. You should make sure you get enough calcium and vitamin D while you are taking this medicine, unless your doctor tells you not to. Discuss the foods you eat and the vitamins you take with your health care professional. Some people who take this medicine have severe bone, joint, and/or muscle  pain. This medicine may also increase your risk for a broken thigh bone. Tell your doctor right away if you have pain in your upper leg or groin. Tell your doctor if you have any pain that does not go away or that gets worse. This medicine can make you more sensitive to the sun. If you get a rash while taking this medicine, sunlight may cause the rash to get worse. Keep out of the sun. If you cannot avoid being in the sun, wear protective clothing and use sunscreen. Do not use sun lamps or tanning beds/booths. What side effects may I notice from receiving this medicine? Side effects that you should report to your doctor or health care professional as soon as possible: -allergic reactions like skin rash, itching or hives, swelling of the face, lips, or tongue -black or tarry stools -bone, muscle or joint pain -changes in vision -chest pain -heartburn or stomach pain -jaw pain, especially after dental work -pain or trouble when swallowing -redness, blistering, peeling or loosening of the skin, including inside the mouth Side effects that usually do not require medical  attention (report to your doctor or health care professional if they continue or are bothersome): -changes in taste -diarrhea or constipation -eye pain or itching -headache -nausea or vomiting -stomach gas or fullness This list may not describe all possible side effects. Call your doctor for medical advice about side effects. You may report side effects to FDA at 1-800-FDA-1088. Where should I keep my medicine? Keep out of the reach of children. Store at room temperature of 15 and 30 degrees C (59 and 86 degrees F). Throw away any unused medicine after the expiration date. NOTE: This sheet is a summary. It may not cover all possible information. If you have questions about this medicine, talk to your doctor, pharmacist, or health care provider.  2018 Elsevier/Gold Standard (2011-10-20 09:02:42)   Denosumab injection What is  this medicine? DENOSUMAB (den oh sue mab) slows bone breakdown. Prolia is used to treat osteoporosis in women after menopause and in men. Delton See is used to treat a high calcium level due to cancer and to prevent bone fractures and other bone problems caused by multiple myeloma or cancer bone metastases. Delton See is also used to treat giant cell tumor of the bone. This medicine may be used for other purposes; ask your health care provider or pharmacist if you have questions. COMMON BRAND NAME(S): Prolia, XGEVA What should I tell my health care provider before I take this medicine? They need to know if you have any of these conditions: -dental disease -having surgery or tooth extraction -infection -kidney disease -low levels of calcium or Vitamin D in the blood -malnutrition -on hemodialysis -skin conditions or sensitivity -thyroid or parathyroid disease -an unusual reaction to denosumab, other medicines, foods, dyes, or preservatives -pregnant or trying to get pregnant -breast-feeding How should I use this medicine? This medicine is for injection under the skin. It is given by a health care professional in a hospital or clinic setting. If you are getting Prolia, a special MedGuide will be given to you by the pharmacist with each prescription and refill. Be sure to read this information carefully each time. For Prolia, talk to your pediatrician regarding the use of this medicine in children. Special care may be needed. For Delton See, talk to your pediatrician regarding the use of this medicine in children. While this drug may be prescribed for children as young as 13 years for selected conditions, precautions do apply. Overdosage: If you think you have taken too much of this medicine contact a poison control center or emergency room at once. NOTE: This medicine is only for you. Do not share this medicine with others. What if I miss a dose? It is important not to miss your dose. Call your doctor or  health care professional if you are unable to keep an appointment. What may interact with this medicine? Do not take this medicine with any of the following medications: -other medicines containing denosumab This medicine may also interact with the following medications: -medicines that lower your chance of fighting infection -steroid medicines like prednisone or cortisone This list may not describe all possible interactions. Give your health care provider a list of all the medicines, herbs, non-prescription drugs, or dietary supplements you use. Also tell them if you smoke, drink alcohol, or use illegal drugs. Some items may interact with your medicine. What should I watch for while using this medicine? Visit your doctor or health care professional for regular checks on your progress. Your doctor or health care professional may order blood tests and other  tests to see how you are doing. Call your doctor or health care professional for advice if you get a fever, chills or sore throat, or other symptoms of a cold or flu. Do not treat yourself. This drug may decrease your body's ability to fight infection. Try to avoid being around people who are sick. You should make sure you get enough calcium and vitamin D while you are taking this medicine, unless your doctor tells you not to. Discuss the foods you eat and the vitamins you take with your health care professional. See your dentist regularly. Brush and floss your teeth as directed. Before you have any dental work done, tell your dentist you are receiving this medicine. Do not become pregnant while taking this medicine or for 5 months after stopping it. Talk with your doctor or health care professional about your birth control options while taking this medicine. Women should inform their doctor if they wish to become pregnant or think they might be pregnant. There is a potential for serious side effects to an unborn child. Talk to your health care  professional or pharmacist for more information. What side effects may I notice from receiving this medicine? Side effects that you should report to your doctor or health care professional as soon as possible: -allergic reactions like skin rash, itching or hives, swelling of the face, lips, or tongue -bone pain -breathing problems -dizziness -jaw pain, especially after dental work -redness, blistering, peeling of the skin -signs and symptoms of infection like fever or chills; cough; sore throat; pain or trouble passing urine -signs of low calcium like fast heartbeat, muscle cramps or muscle pain; pain, tingling, numbness in the hands or feet; seizures -unusual bleeding or bruising -unusually weak or tired Side effects that usually do not require medical attention (report to your doctor or health care professional if they continue or are bothersome): -constipation -diarrhea -headache -joint pain -loss of appetite -muscle pain -runny nose -tiredness -upset stomach This list may not describe all possible side effects. Call your doctor for medical advice about side effects. You may report side effects to FDA at 1-800-FDA-1088. Where should I keep my medicine? This medicine is only given in a clinic, doctor's office, or other health care setting and will not be stored at home. NOTE: This sheet is a summary. It may not cover all possible information. If you have questions about this medicine, talk to your doctor, pharmacist, or health care provider.  2018 Elsevier/Gold Standard (2016-12-27 19:17:21)

## 2018-03-04 ENCOUNTER — Other Ambulatory Visit: Payer: Self-pay | Admitting: Internal Medicine

## 2018-03-05 DIAGNOSIS — E785 Hyperlipidemia, unspecified: Secondary | ICD-10-CM | POA: Diagnosis not present

## 2018-03-05 LAB — LIPID PANEL
Chol/HDL Ratio: 2 ratio (ref 0.0–4.4)
Cholesterol, Total: 183 mg/dL (ref 100–199)
HDL: 91 mg/dL (ref >39)
LDL CALC: 84 mg/dL (ref 0–99)
Triglycerides: 39 mg/dL (ref 0–149)
VLDL CHOLESTEROL CAL: 8 mg/dL (ref 5–40)

## 2018-04-04 ENCOUNTER — Other Ambulatory Visit: Payer: Self-pay | Admitting: Internal Medicine

## 2018-04-11 DIAGNOSIS — G4733 Obstructive sleep apnea (adult) (pediatric): Secondary | ICD-10-CM | POA: Diagnosis not present

## 2018-05-21 ENCOUNTER — Ambulatory Visit: Payer: Medicare Other | Admitting: Internal Medicine

## 2018-05-21 ENCOUNTER — Encounter: Payer: Self-pay | Admitting: Internal Medicine

## 2018-05-21 VITALS — BP 138/82 | HR 87 | Ht 63.0 in | Wt 162.6 lb

## 2018-05-21 DIAGNOSIS — Z9989 Dependence on other enabling machines and devices: Secondary | ICD-10-CM

## 2018-05-21 DIAGNOSIS — G4733 Obstructive sleep apnea (adult) (pediatric): Secondary | ICD-10-CM

## 2018-05-21 DIAGNOSIS — I4819 Other persistent atrial fibrillation: Secondary | ICD-10-CM

## 2018-05-21 DIAGNOSIS — E785 Hyperlipidemia, unspecified: Secondary | ICD-10-CM | POA: Diagnosis not present

## 2018-05-21 DIAGNOSIS — I2510 Atherosclerotic heart disease of native coronary artery without angina pectoris: Secondary | ICD-10-CM | POA: Diagnosis not present

## 2018-05-21 DIAGNOSIS — I481 Persistent atrial fibrillation: Secondary | ICD-10-CM

## 2018-05-21 MED ORDER — ROSUVASTATIN CALCIUM 5 MG PO TABS: 5.0000 mg | ORAL_TABLET | ORAL | 3 refills | Status: DC

## 2018-05-21 NOTE — Progress Notes (Signed)
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 Recently, she developed substernal chest pain in the fall and underwent cardiac catheterization. This demonstrated patent LAD stent that was proximal as well as 30% disease of the LAD just prior to that stent, but no other areas of obstructive coronary disease. She continued to have chest pain; however, I felt it was most likely related to stress. I have also encouraged dietary changes as well as trying to reduce her stress and alcohol intake. She has accomplished that to some extent and, over the past 6 months has had no other symptoms of chest discomfort. She occasionally gets short of breath with heavy exertion and has some swelling in her legs only when standing up for long periods of time. She continues to work at [employer] although there is some stress she is actually maintaining her exercise regimen. This is clearly demonstrated improvement in her risk factors including a reduction in cholesterol. A recent lipid NMR demonstrated a particle number of 631 and an LDL cholesterol of 81.   Theresa Mccarty returns for annual followup today. She reports feeling very well. She recently had the shingles vaccine at [facility]. She had some questions today about starting coconut oil glucosamine/chondroitin which I think are fine. She did have one episode of discomfort in her chest after working in regard for which she took nitroglycerin however she noted no improvement in her symptoms. She is very interested in decreasing her Lipitor she does report some soreness in her arm muscles as well as her leg muscles. She believes this is related to the statin. She has not been on any other statin since her stent was placed in 2008. She is interested a recheck of her cholesterol  today as well as a screening hemoglobin A1c - she is nondiabetic.  Finally, she recently had a fall at home which was due to tripping over a table leg. She did have some soreness in her right hand as well as ecchymosis and swelling of her digits and 2 bandages on her right toes.  Mr. Picha has no specific complaints today. She denies any chest pain or worsening shortness of breath. Blood pressure is at goal. She continues to take aspirin and Plavix without any bleeding complications. Cholesterol is due for recheck. She does report some mild calf tenderness which she attributes to a atorvastatin.  06/13/2016  Theresa Mccarty returns today for follow-up. Over the past year she's done fairly well. She's had some struggles with arthritis. She noted that she was having some side effects related to atorvastatin including weakness in her arm which improved after discontinuing the medicine. She says she's had about 8 pound weight loss however we demonstrated only 1-2 pounds in the office. She is currently on red yeast rice and wishes to have a fasting lipid profile today. She denies any significant chest discomfort or worsening shortness of breath. She recently retired from her job and notes less stress in her life. Blood pressure is well-controlled today.  04/20/2017  Theresa Mccarty was seen today in follow-up. Recently she was seen by Kerin Ransom and had symptoms of worsening fatigue and palpitations. She was found to be in A. fib with RVR. He adjusted some medications for rate control including adding diltiazem and started her on Eliquis. She  reports she's been taking Eliquis for the past several weeks however did miss at least one dose possibly 2. Today heart rate is just below 100 and her rhythm is irregular. She reports she feels better but still is fatigued. We discussed the possibility of a cardioversion attempt. She understands she would need at least 3 weeks of uninterrupted anticoagulation or TEE guided approach in order to  do that. She feels that she could be more compliant with medication.  06/16/2017  Theresa Mccarty returns today for follow-up. She underwent cardioversion which was unsuccessful. Subsequently she said that she does not think that she was terribly symptomatic therefore we proceeded rate controlled. I increased her diltiazem and heart rate is well-controlled in the 80s today. She reports no significant fatigue. She is switched from Eliquis to Xarelto due to the fact that she had trouble remembering the second dose of Eliquis. Blood pressure was elevated somewhat initially 154/95 however came down to 138/88. She also reports some abdominal bloating but his recent started on probiotics. She gets some lower extremity swelling but has some evidence of venous insufficiency. She is on hydrochlorothiazide with Diovan.  12/04/2017  Theresa Mccarty was seen today in follow-up.  She continues to be without complaints.  She denies any chest pain or worsening shortness of breath.  She remains in persistent if not permanent atrial fibrillation.  She has been on Xarelto which is been easier for her to remember however she is concerned about the cost, particularly now that she is in the donut hole is causing her about $500 a month.  Blood pressure is pretty well controlled.  She showed me numbers at home indicating blood pressures between 093 and 818 systolic.  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  05/21/2018  Theresa Mccarty was seen today in follow-up.  She denies chest pain or shortness of breath.  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  She is in persistent if not permanent atrial fibrillation.  Her family recently purchased a [place] mobile watch for her to monitor her a-fib and she has the ability to send EKGs to me.  Blood pressure is reasonably well controlled today.  We recently discussed her lipid  profile which in March showed an LDL of 84 with total cholesterol 183.  She has been taking ezetimibe but is statin intolerant.  Her goal LDL is less than 70 given her history of coronary artery disease and she may be a candidate for PCSK9 inhibitor therapy.  In the past she had intolerance to atorvastatin, but did not recall she had tried rosuvastatin.  PMHx:  Past Medical History:  Diagnosis Date  . Atrial fibrillation ([place])    a. initially diagnosed in 02/2017. Started on Eliquis  . Back pain, chronic   . Broken leg 1958  . CAD (coronary artery disease)    a. s/p DES to LAD in 2008, patent by cath in 2012.  [name] Hearing loss    bilateral hearing aids  . History of echocardiogram 09/11/2007   normal  . History of nuclear stress test 11/17/2010   exercise; small fixed anteroseptal defect (?artifact); short run of atrial-tachy during recovery; low risk  . Hyperlipidemia   . Hypertension   . Hypothyroid   . Impaired fasting glucose   . Osteoporosis   . Renal cell carcinoma right; 2011   Dr. Alinda Money, [facility]  . Sleep apnea  on CPAP    Past Surgical History:  Procedure Laterality Date  . ABDOMINAL HYSTERECTOMY  1975   PARTIAL; part of 1 ovary remains  . CARDIAC CATHETERIZATION  10/08/2011   LAD-prox stent patent; no significant obstructive CAD; hi LVF end-diastolic pressure  . CARDIOVERSION N/A 05/12/2017   Procedure: CARDIOVERSION;  Surgeon: [name], MD;  Location: [facility] ENDOSCOPY;  Service: Cardiovascular;  Laterality: N/A;  . CORONARY ANGIOPLASTY WITH STENT PLACEMENT  2008   mid LAD  . IR GENERIC HISTORICAL  08/18/2016   IR RADIOLOGIST EVAL & MGMT 08/18/2016 [name], MD [facility] INTERV RAD  . KIDNEY SURGERY  2011   renal carcinoma - cryoablation -  Dr. Alinda Money and Dr. Laural Roes (cryoablation)  . SPINE SURGERY      FAMHx:  Family History  Problem Relation Age of Onset  . Heart disease Mother   . Hypertension Mother   . Arthritis Mother   . Osteoporosis  Mother   . Depression Sister   . Hyperlipidemia Sister   . Heart disease Father   . Diabetes Brother   . Cancer Sister        unsure, related to smoking?  . Cancer Sister        lung cancer  . Hyperlipidemia Brother   . Hypertension Brother     SOCHx:   reports that she quit smoking about 49 years ago. She has never used smokeless tobacco. She reports that she drinks about 1.2 oz of alcohol per week. She reports that she does not use drugs.  ALLERGIES:  Allergies  Allergen Reactions  . Atorvastatin Other (See Comments)    Muscle aches  . Celebrex [Celecoxib] Other (See Comments)    Muscle aches  . Nitrofurantoin Nausea Only and Rash    ROS: Pertinent items noted in HPI and remainder of comprehensive ROS otherwise negative.  HOME MEDS: Current Outpatient Medications  Medication Sig Dispense Refill  . alendronate (FOSAMAX) 70 MG tablet Take 1 tablet (70 mg total) by mouth every 7 (seven) days. Take with a full glass of water on an empty stomach. 12 tablet 3  . aspirin EC 81 MG tablet Take 1 tablet (81 mg total) by mouth daily. 90 tablet 3  . B Complex Vitamins (B-COMPLEX/B-12 PO) Take 1 tablet by mouth daily.      . Calcium-Magnesium-Zinc 500-250-12.5 MG TABS Take 1 tablet by mouth daily.    . Cholecalciferol (VITAMIN D) 2000 units tablet Take 2,000 Units by mouth daily.    . Coenzyme Q10 (COQ10) 100 MG CAPS Take 1 tablet by mouth daily.     [name] diltiazem (CARDIZEM CD) 240 MG 24 hr capsule TAKE ONE CAPSULE BY MOUTH EVERY DAY 30 capsule 2  . ezetimibe (ZETIA) 10 MG tablet Take 1 tablet (10 mg total) by mouth daily. 90 tablet 3  . levothyroxine (SYNTHROID, LEVOTHROID) 50 MCG tablet TAKE 1 TABLET (50 MCG TOTAL) BY MOUTH DAILY. 90 tablet 1  . nitroGLYCERIN (NITROSTAT) 0.4 MG SL tablet Place 1 tablet (0.4 mg total) under the tongue every 5 (five) minutes as needed for chest pain. 90 tablet 1  . Polyvinyl Alcohol-Povidone (REFRESH OP) Place 1 drop into both eyes daily.    . Probiotic  Product (PROBIOTIC PO) Take 1 capsule by mouth daily.    . Red Yeast Rice Extract (RED YEAST RICE PO) Take by mouth daily.    . rivaroxaban (XARELTO) 20 MG TABS tablet Take 1 tablet (20 mg total) by mouth daily with supper. 28 tablet  0  . valsartan-hydrochlorothiazide (DIOVAN-HCT) 320-25 MG tablet TAKE 1 TABLET BY MOUTH DAILY. 90 tablet 2  . XARELTO 20 MG TABS tablet TAKE 1 TABLET (20 MG TOTAL) BY MOUTH DAILY WITH SUPPER. 90 tablet 1   No current facility-administered medications for this visit.     LABS/IMAGING: No results found for this or any previous visit (from the past 48 hour(s)). No results found.  VITALS: BP 138/82   Pulse 87   Ht 5\' 3"  (1.6 m)   Wt 162 lb 9.6 oz (73.8 kg)   BMI 28.80 kg/m   EXAM: General appearance: alert and no distress Neck: no carotid bruit and no JVD Lungs: clear to auscultation bilaterally Heart: irregularly irregular rhythm Abdomen: soft, non-tender; bowel sounds normal; no masses,  no organomegaly Extremities: extremities normal, atraumatic, no cyanosis or edema Pulses: 2+ and symmetric Skin: Skin color, texture, turgor normal. No rashes or lesions Neurologic: Grossly normal Psych: Pleasant  EKG: Atrial fibrillation at 87, nonspecific T wave changes-personally reviewed  ASSESSMENT: 1. Persistent atrial fibrillation with controlled ventricular response - failed cardioversion 2. CHADSVASC score of 4 on Xarelto 3. Coronary artery disease status post PCI of the proximal LAD in 2008 4. Hypertension - controlled 5. Hyperlipidemia -not at goal LDL less than 70 6. Statin intolerance 7. Obstructive sleep apnea on CPAP 8. Arthritis  PLAN: 1.   Theresa Mccarty doing well from a clinical standpoint.  She remains in long-standing persistent A. fib.  She is anticoagulated on Xarelto without bleeding difficulty.  She is been relatively statin intolerant and recently started on ezetimibe.  LDL is not at goal.  I would like to try low-dose rosuvastatin 5 mg  every other day to see if she can tolerate this.  Hopefully this will help [place] to reach goal LDL less than 70.  Blood pressure is reasonably well controlled.  She reports compliance with CPAP.  We will plan to stop her red yeast rice and recheck a lipid profile in 3 months.  Follow-up with me at that time.  [name], MD, [facility], [place] Director of the [facility] Lip[facility]  [facility] Attending Cardiologist  Direct Dial: [phone]  Fax: [phone]  Website:  [email or web address] 05/21/2018, 10:56 AM

## 2018-05-21 NOTE — Patient Instructions (Signed)
Medication Instructions:   STOP red yeast rice  START crestor 5mg  every other day  Labwork:  Your physician recommends that you return for lab work in [place] (fasting) to check cholesterol   Testing/Procedures:  NONE  Follow-Up:  Your physician recommends that you schedule a follow-up appointment in [place] (after lab work)   If you need a refill on your cardiac medications before your next appointment, please call your pharmacy.  Any Other Special Instructions Will Be Listed Below (If Applicable).

## 2018-05-30 ENCOUNTER — Other Ambulatory Visit: Payer: Self-pay | Admitting: Internal Medicine

## 2018-06-20 DIAGNOSIS — L570 Actinic keratosis: Secondary | ICD-10-CM | POA: Diagnosis not present

## 2018-06-20 DIAGNOSIS — L821 Other seborrheic keratosis: Secondary | ICD-10-CM | POA: Diagnosis not present

## 2018-06-20 DIAGNOSIS — D225 Melanocytic nevi of trunk: Secondary | ICD-10-CM | POA: Diagnosis not present

## 2018-06-20 DIAGNOSIS — C44619 Basal cell carcinoma of skin of left upper limb, including shoulder: Secondary | ICD-10-CM | POA: Diagnosis not present

## 2018-06-28 ENCOUNTER — Encounter: Payer: Self-pay | Admitting: Family Medicine

## 2018-07-02 ENCOUNTER — Other Ambulatory Visit: Payer: Self-pay | Admitting: Family Medicine

## 2018-07-02 DIAGNOSIS — E039 Hypothyroidism, unspecified: Secondary | ICD-10-CM

## 2018-07-09 ENCOUNTER — Other Ambulatory Visit (HOSPITAL_COMMUNITY): Payer: Self-pay | Admitting: Interventional Radiology

## 2018-07-09 ENCOUNTER — Other Ambulatory Visit: Payer: Self-pay | Admitting: Radiology

## 2018-07-09 DIAGNOSIS — K862 Cyst of pancreas: Secondary | ICD-10-CM

## 2018-07-09 DIAGNOSIS — C641 Malignant neoplasm of right kidney, except renal pelvis: Secondary | ICD-10-CM

## 2018-07-26 DIAGNOSIS — H2513 Age-related nuclear cataract, bilateral: Secondary | ICD-10-CM | POA: Diagnosis not present

## 2018-07-26 DIAGNOSIS — H5213 Myopia, bilateral: Secondary | ICD-10-CM | POA: Diagnosis not present

## 2018-07-26 DIAGNOSIS — H52203 Unspecified astigmatism, bilateral: Secondary | ICD-10-CM | POA: Diagnosis not present

## 2018-07-26 DIAGNOSIS — H524 Presbyopia: Secondary | ICD-10-CM | POA: Diagnosis not present

## 2018-08-09 ENCOUNTER — Ambulatory Visit
Admission: RE | Admit: 2018-08-09 | Discharge: 2018-08-09 | Disposition: A | Discharge status: Home / Self Care | Payer: Medicare Other | Source: Ambulatory Visit | Attending: Interventional Radiology | Admitting: Interventional Radiology

## 2018-08-09 ENCOUNTER — Ambulatory Visit (HOSPITAL_COMMUNITY)
Admission: RE | Admit: 2018-08-09 | Discharge: 2018-08-09 | Disposition: A | Discharge status: Home / Self Care | Payer: Medicare Other | Source: Ambulatory Visit | Attending: Interventional Radiology | Admitting: Interventional Radiology

## 2018-08-09 ENCOUNTER — Other Ambulatory Visit (HOSPITAL_COMMUNITY): Payer: Self-pay | Admitting: Interventional Radiology

## 2018-08-09 ENCOUNTER — Other Ambulatory Visit: Payer: Medicare Other

## 2018-08-09 ENCOUNTER — Encounter: Payer: Self-pay | Admitting: Radiology

## 2018-08-09 DIAGNOSIS — N281 Cyst of kidney, acquired: Secondary | ICD-10-CM | POA: Insufficient documentation

## 2018-08-09 DIAGNOSIS — N2889 Other specified disorders of kidney and ureter: Secondary | ICD-10-CM | POA: Diagnosis not present

## 2018-08-09 DIAGNOSIS — K7689 Other specified diseases of liver: Secondary | ICD-10-CM | POA: Insufficient documentation

## 2018-08-09 DIAGNOSIS — Z9889 Other specified postprocedural states: Secondary | ICD-10-CM | POA: Insufficient documentation

## 2018-08-09 DIAGNOSIS — C641 Malignant neoplasm of right kidney, except renal pelvis: Secondary | ICD-10-CM | POA: Diagnosis not present

## 2018-08-09 DIAGNOSIS — K862 Cyst of pancreas: Secondary | ICD-10-CM | POA: Diagnosis not present

## 2018-08-09 DIAGNOSIS — K760 Fatty (change of) liver, not elsewhere classified: Secondary | ICD-10-CM | POA: Insufficient documentation

## 2018-08-09 DIAGNOSIS — R932 Abnormal findings on diagnostic imaging of liver and biliary tract: Secondary | ICD-10-CM | POA: Diagnosis not present

## 2018-08-09 HISTORY — PX: IR RADIOLOGIST EVAL & MGMT: IMG5224

## 2018-08-09 LAB — POCT I-STAT CREATININE: Creatinine, Ser: 0.6 mg/dL (ref 0.44–1.00)

## 2018-08-09 MED ORDER — GADOBENATE DIMEGLUMINE 529 MG/ML IV SOLN
20.0000 mL | Freq: Once | INTRAVENOUS | Status: AC | PRN
  Administered 2018-08-09: 20 mL via INTRAVENOUS

## 2018-08-09 NOTE — Progress Notes (Signed)
Referring Physician(s): Yamagata,Glenn  Chief Complaint: The patient is seen in follow up today s/p right renal cryoablation in 2011  History of present illness: Theresa Mccarty is a 77 year old female with past medical history of right renal lesion who underwent successful cryoablation procedure in 2011.  She was following for routine monitoring of her ablation site, however at last appointment her MRI revealed a new pancreatic cyst.  Dr. Kathlene Cote recommended 2 year follow-up with repeat imaging.  Patient presents to clinic today for follow-up and review of MR imaging obtained this AM. She presents her in usual state of health.  She has no new symptoms or concerns; denies fever, chills, abdominal pain, difficulty eating/drinking, nausea, vomiting, back pain, dysuria, hematuria.   Past Medical History:  Diagnosis Date  . Atrial fibrillation ([place])    a. initially diagnosed in 02/2017. Started on Eliquis  . Back pain, chronic   . Broken leg 1958  . CAD (coronary artery disease)    a. s/p DES to LAD in 2008, patent by cath in 2012.  [name] Hearing loss    bilateral hearing aids  . History of echocardiogram 09/11/2007   normal  . History of nuclear stress test 11/17/2010   exercise; small fixed anteroseptal defect (?artifact); short run of atrial-tachy during recovery; low risk  . Hyperlipidemia   . Hypertension   . Hypothyroid   . Impaired fasting glucose   . Osteoporosis   . Renal cell carcinoma right; 2011   Dr. Alinda Money, [facility]  . Sleep apnea    on CPAP    Past Surgical History:  Procedure Laterality Date  . ABDOMINAL HYSTERECTOMY  1975   PARTIAL; part of 1 ovary remains  . CARDIAC CATHETERIZATION  10/08/2011   LAD-prox stent patent; no significant obstructive CAD; hi LVF end-diastolic pressure  . CARDIOVERSION N/A 05/12/2017   Procedure: CARDIOVERSION;  Surgeon: [name], MD;  Location: [facility];  Service: Cardiovascular;  Laterality: N/A;  . CORONARY  ANGIOPLASTY WITH STENT PLACEMENT  2008   mid LAD  . IR GENERIC HISTORICAL  08/18/2016   IR RADIOLOGIST EVAL & MGMT 08/18/2016 [name], MD [facility] INTERV RAD  . KIDNEY SURGERY  2011   renal carcinoma - cryoablation -  Dr. Alinda Money and Dr. Laural Roes (cryoablation)  . SPINE SURGERY      Allergies: Atorvastatin; Celebrex [celecoxib]; and Nitrofurantoin  Medications: Prior to Admission medications   Medication Sig Start Date End Date Taking? Authorizing Provider  alendronate (FOSAMAX) 70 MG tablet Take 1 tablet (70 mg total) by mouth every 7 (seven) days. Take with a full glass of water on an empty stomach. 02/05/18   [name], MD  aspirin EC 81 MG tablet Take 1 tablet (81 mg total) by mouth daily. 04/20/17   Hilty, Nadean Corwin, MD  B Complex Vitamins (B-COMPLEX/B-12 PO) Take 1 tablet by mouth daily.      [provider]  Calcium-Magnesium-Zinc 500-250-12.5 MG TABS Take 1 tablet by mouth daily.    [provider]  Cholecalciferol (VITAMIN D) 2000 units tablet Take 2,000 Units by mouth daily.    [provider]  Coenzyme Q10 (COQ10) 100 MG CAPS Take 1 tablet by mouth daily.     [provider]  diltiazem (CARDIZEM CD) 240 MG 24 hr capsule TAKE ONE CAPSULE BY MOUTH EVERY DAY 05/30/18   Hilty, Nadean Corwin, MD  ezetimibe (ZETIA) 10 MG tablet Take 1 tablet (10 mg total) by mouth daily. 12/04/17 05/21/18  Lyman Bishop  C, MD  levothyroxine (SYNTHROID, LEVOTHROID) 50 MCG tablet TAKE 1 TABLET BY MOUTH EVERY DAY 07/02/18   [name], MD  nitroGLYCERIN (NITROSTAT) 0.4 MG SL tablet Place 1 tablet (0.4 mg total) under the tongue every 5 (five) minutes as needed for chest pain. 01/08/18   Hilty, Nadean Corwin, MD  Polyvinyl Alcohol-Povidone (REFRESH OP) Place 1 drop into both eyes daily.    [provider]  Probiotic Product (PROBIOTIC PO) Take 1 capsule by mouth daily.    [provider]  rivaroxaban (XARELTO) 20 MG TABS tablet Take 1 tablet (20 mg total) by mouth  daily with supper. 09/21/17   Hilty, Nadean Corwin, MD  rosuvastatin (CRESTOR) 5 MG tablet Take 1 tablet (5 mg total) by mouth every other day. 05/21/18   Hilty, Nadean Corwin, MD  valsartan-hydrochlorothiazide (DIOVAN-HCT) 320-25 MG tablet TAKE 1 TABLET BY MOUTH DAILY. 03/05/18   Hilty, Nadean Corwin, MD  XARELTO 20 MG TABS tablet TAKE 1 TABLET (20 MG TOTAL) BY MOUTH DAILY WITH SUPPER. 04/04/18   Hilty, Nadean Corwin, MD     Family History  Problem Relation Age of Onset  . Heart disease Mother   . Hypertension Mother   . Arthritis Mother   . Osteoporosis Mother   . Depression Sister   . Hyperlipidemia Sister   . Heart disease Father   . Diabetes Brother   . Cancer Sister        unsure, related to smoking?  . Cancer Sister        lung cancer  . Hyperlipidemia Brother   . Hypertension Brother     Social History   Socioeconomic History  . Marital status: Married    Spouse name: Not on file  . Number of children: 2  . Years of education: Not on file  . Highest education level: Not on file  Occupational History  . Occupation: retired Teacher, music: [employer]  Social Needs  . Financial resource strain: Not on file  . Food insecurity:    Worry: Not on file    Inability: Not on file  . Transportation needs:    Medical: Not on file    Non-medical: Not on file  Tobacco Use  . Smoking status: Former Smoker    Last attempt to quit: 12/19/1968    Years since quitting: 49.6  . Smokeless tobacco: Never Used  Substance and Sexual Activity  . Alcohol use: Yes    Alcohol/week: 2.0 standard drinks    Types: 2 Glasses of wine per week    Comment: 0-3 glasses of wine per day. Trying to cut back on overall consumption (used to have 2/d)  . Drug use: No  . Sexual activity: Not Currently  Lifestyle  . Physical activity:    Days per week: Not on file    Minutes per session: Not on file  . Stress: Not on file  Relationships  . Social connections:    Talks on phone: Not on file    Gets  together: Not on file    Attends religious service: Not on file    Active member of club or organization: Not on file    Attends meetings of clubs or organizations: Not on file    Relationship status: Not on file  Other Topics Concern  . Not on file  Social History Narrative   Lives with husband.  2 sons live near [place]. Retired from working for [employer] 02/2016. 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     Vital Signs: BP 127/84   Pulse 76   Temp 98.4 F (36.9 C) (Oral)   Resp 15   Ht 5' 2.5" (1.588 m)   Wt 161 lb (73 kg)   SpO2 98%   BMI 28.98 kg/m   Physical Exam  Nursing note and vitals reviewed. NAD, alert, cooperative Heart: irregular, irregular Chest: clear to auscultation bilaterally Abdomen: Soft, non-tender in all 4 quadrants, no palpable masses or lesions Back: soft, non-tender bilaterally   Imaging: Mr Abdomen Wwo Contrast  Result Date: 08/09/2018 CLINICAL DATA:  Evaluate pancreatic cyst and kidney lesions. EXAM: MRI ABDOMEN WITHOUT AND WITH CONTRAST TECHNIQUE: Multiplanar multisequence MR imaging of the abdomen was performed both before and after the administration of intravenous contrast. CONTRAST:  26mL MULTIHANCE GADOBENATE DIMEGLUMINE 529 MG/ML IV SOLN COMPARISON:  CT abdomen from 07/28/2015 an MRI from 04/29/2014 FINDINGS: Lower chest: No acute findings. Hepatobiliary: Diffuse hepatic steatosis. Multiple liver cysts are again identified. The largest is in the posterior right hepatic lobe measuring 3.3 cm. No suspicious liver abnormality identified. Gallbladder appears normal. No significant biliary dilatation. Pancreas: No pancreatic inflammation or main duct dilatation identified. There are numerous T2 hyperintense cystic lesions identified throughout the pancreas. The index cystic lesion within the tail of pancreas measures 1.1 cm, image 20/5. Previously 1.6 cm. The index cyst within  the body of pancreas measures 6 mm and is unchanged, image 32/5. New cyst within head of pancreas measures 0.8 cm, image 16/6. cyst within uncinate process of pancreas measures 1 cm, image 18/6. Previously this measured the same. No solid enhancing pancreatic lesion identified. Spleen:  Within normal limits in size and appearance. Adrenals/Urinary Tract:  Normal appearance of the adrenal glands. Evaluation of the kidneys is somewhat limited due to respiratory there are several hemorrhagic cysts identified motion artifact. No hydronephrosis identified bilaterally. The cryo ablation defect involving the medial cortex of the upper pole of the right kidney appears stable. No specific findings identified to suggest residual/recurrence of tumor. Bilateral kidney cysts are identified of varying complexity. Assessment of internal enhancement characteristics of these lesions is diminished secondary to respiratory motion artifact. Within the posterior cortex of the upper pole of right kidney there is a septated lesion measuring 1.6 cm, image 25/9. And image 33/8. There is equivocal enhancement within this structure versus volume averaging with adjacent medullary pyramid, image 57/1203. On the previous exam this structure measured 8 mm. Additional small cysts within the inferior pole the right kidney are too small to reliably characterize. Several left kidney cysts are complicated by hemorrhage. The previously referenced index lesion arising from the anterolateral cortex of the interpolar left kidney with equivocal enhancement currently measures 1.2 cm, image 30/4. Previously this measured 0.9 cm. No definite internal enhancement identified within this structure. Additional lesions include a 2.2 cm hemorrhagic lesion arising from the medial cortex of the upper pole of left kidney, image 25/4, previously 1.5 cm. No definite enhancement identified within this structure. Posterior cortex of left upper pole kidney hemorrhagic  lesion measuring 1.9 cm is unchanged, image 27/4. Hemorrhagic lesion arising from the posterolateral cortex of the lower pole left kidney is stable in size measuring 1.9 cm, image 35/4. Also no definite internal enhancement. Stomach/Bowel: Visualized portions within the abdomen are unremarkable. Vascular/Lymphatic: No pathologically enlarged lymph nodes identified. No abdominal aortic aneurysm demonstrated. Other:  None. Musculoskeletal: No suspicious bone lesions identified. IMPRESSION: 1. Exam detail  is diminished secondary to respiratory motion artifact 2. Stable postprocedure changes of prior cryoablation involving the medial aspect of the upper pole of right kidney. No findings to suggest local recurrence of disease. 3. Multiple kidney are identified in both kidneys. The respiratory motion makes evaluation of these lesions specially difficult. Within the upper pole of the right kidney there is a septated cyst which appears increased in size from previous exam which exhibits equivocal internal enhancement versus volume averaging with adjacent medullary pyramid. Advise close interval follow-up of this lesion. There are several lesions in the left kidney which are complicated by hemorrhage. No definitive enhancement identified within these structures. 4. Again noted are numerous cysts scattered throughout the pancreas. These have a benign nonaggressive appearance. One in the head of pancreas appears new. Based on the size of the cysts and age of patient suggest follow-up imaging in 2 years with repeat contrast enhanced MRI of the abdomen. This recommendation follows ACR consensus guidelines: Management of Incidental Pancreatic Cysts: A White Paper of the ACR Incidental Findings Committee. J Am Coll Radiol 9563;87:564-332. 5. Hepatic steatosis 6. Liver cysts. Electronically Signed   By: Kerby Moors M.D.   On: 08/09/2018 11:51   Mr 3d Recon At Scanner  Result Date: 08/09/2018 CLINICAL DATA:  Evaluate  pancreatic cyst and kidney lesions. EXAM: MRI ABDOMEN WITHOUT AND WITH CONTRAST TECHNIQUE: Multiplanar multisequence MR imaging of the abdomen was performed both before and after the administration of intravenous contrast. CONTRAST:  24mL MULTIHANCE GADOBENATE DIMEGLUMINE 529 MG/ML IV SOLN COMPARISON:  CT abdomen from 07/28/2015 an MRI from 04/29/2014 FINDINGS: Lower chest: No acute findings. Hepatobiliary: Diffuse hepatic steatosis. Multiple liver cysts are again identified. The largest is in the posterior right hepatic lobe measuring 3.3 cm. No suspicious liver abnormality identified. Gallbladder appears normal. No significant biliary dilatation. Pancreas: No pancreatic inflammation or main duct dilatation identified. There are numerous T2 hyperintense cystic lesions identified throughout the pancreas. The index cystic lesion within the tail of pancreas measures 1.1 cm, image 20/5. Previously 1.6 cm. The index cyst within the body of pancreas measures 6 mm and is unchanged, image 32/5. New cyst within head of pancreas measures 0.8 cm, image 16/6. cyst within uncinate process of pancreas measures 1 cm, image 18/6. Previously this measured the same. No solid enhancing pancreatic lesion identified. Spleen:  Within normal limits in size and appearance. Adrenals/Urinary Tract:  Normal appearance of the adrenal glands. Evaluation of the kidneys is somewhat limited due to respiratory there are several hemorrhagic cysts identified motion artifact. No hydronephrosis identified bilaterally. The cryo ablation defect involving the medial cortex of the upper pole of the right kidney appears stable. No specific findings identified to suggest residual/recurrence of tumor. Bilateral kidney cysts are identified of varying complexity. Assessment of internal enhancement characteristics of these lesions is diminished secondary to respiratory motion artifact. Within the posterior cortex of the upper pole of right kidney there is a  septated lesion measuring 1.6 cm, image 25/9. And image 33/8. There is equivocal enhancement within this structure versus volume averaging with adjacent medullary pyramid, image 57/1203. On the previous exam this structure measured 8 mm. Additional small cysts within the inferior pole the right kidney are too small to reliably characterize. Several left kidney cysts are complicated by hemorrhage. The previously referenced index lesion arising from the anterolateral cortex of the interpolar left kidney with equivocal enhancement currently measures 1.2 cm, image 30/4. Previously this measured 0.9 cm. No definite internal enhancement identified within this structure. Additional  lesions include a 2.2 cm hemorrhagic lesion arising from the medial cortex of the upper pole of left kidney, image 25/4, previously 1.5 cm. No definite enhancement identified within this structure. Posterior cortex of left upper pole kidney hemorrhagic lesion measuring 1.9 cm is unchanged, image 27/4. Hemorrhagic lesion arising from the posterolateral cortex of the lower pole left kidney is stable in size measuring 1.9 cm, image 35/4. Also no definite internal enhancement. Stomach/Bowel: Visualized portions within the abdomen are unremarkable. Vascular/Lymphatic: No pathologically enlarged lymph nodes identified. No abdominal aortic aneurysm demonstrated. Other:  None. Musculoskeletal: No suspicious bone lesions identified. IMPRESSION: 1. Exam detail is diminished secondary to respiratory motion artifact 2. Stable postprocedure changes of prior cryoablation involving the medial aspect of the upper pole of right kidney. No findings to suggest local recurrence of disease. 3. Multiple kidney are identified in both kidneys. The respiratory motion makes evaluation of these lesions specially difficult. Within the upper pole of the right kidney there is a septated cyst which appears increased in size from previous exam which exhibits equivocal internal  enhancement versus volume averaging with adjacent medullary pyramid. Advise close interval follow-up of this lesion. There are several lesions in the left kidney which are complicated by hemorrhage. No definitive enhancement identified within these structures. 4. Again noted are numerous cysts scattered throughout the pancreas. These have a benign nonaggressive appearance. One in the head of pancreas appears new. Based on the size of the cysts and age of patient suggest follow-up imaging in 2 years with repeat contrast enhanced MRI of the abdomen. This recommendation follows ACR consensus guidelines: Management of Incidental Pancreatic Cysts: A White Paper of the ACR Incidental Findings Committee. J Am Coll Radiol 0623;76:283-151. 5. Hepatic steatosis 6. Liver cysts. Electronically Signed   By: Kerby Moors M.D.   On: 08/09/2018 11:51    Labs:  CBC: Recent Labs    11/30/17 1523  WBC 6.9  HGB 14.4  HCT 41.4  PLT 313    COAGS: No results for input(s): INR, APTT in the last 8760 hours.  BMP: Recent Labs    11/30/17 1523 08/09/18 1025  NA 130*  --   K 4.0  --   CL 94*  --   CO2 26  --   GLUCOSE 94  --   BUN 16  --   CALCIUM 9.5  --   CREATININE 0.65 0.60    LIVER FUNCTION TESTS: Recent Labs    11/30/17 1523  BILITOT 0.8  AST 25  ALT 20  PROT 6.7    Assessment: Right Renal lesion s/p cryoablation procedure in 2011 Patient s/p procedure several years ago.  She has been stable without development of new symptoms.  She retains good renal function as her SCr today prior to scan was 0.6 An MRI is obtained today.  Dr. Kathlene Cote has seen the patient and discussed imaging results with her.  Continue with regular follow-up as determined by Dr. Kathlene Cote.   Pancreatic cyst Patient with incidental finding of pancreatic cyst 2 years ago.  She remains asymptomatic.  Repeat imaging obtained and reviewed with Dr. Kathlene Cote who has discussed with the patient.  Due to potentially  increased size of the septated cyst, Dr. Kathlene Cote recommends repeat imaging in 1 year.   Signed: Docia Barrier, PA 08/09/2018, 1:35 PM   Please refer to Dr. Kathlene Cote attestation of this note for management and plan.

## 2018-08-10 ENCOUNTER — Ambulatory Visit (HOSPITAL_COMMUNITY)
Admission: RE | Admit: 2018-08-10 | Discharge: 2018-08-10 | Disposition: A | Discharge status: Home / Self Care | Payer: Medicare Other | Source: Ambulatory Visit | Attending: Urology | Admitting: Urology

## 2018-08-10 ENCOUNTER — Other Ambulatory Visit (HOSPITAL_COMMUNITY): Payer: Self-pay | Admitting: Urology

## 2018-08-10 DIAGNOSIS — C641 Malignant neoplasm of right kidney, except renal pelvis: Secondary | ICD-10-CM

## 2018-08-21 DIAGNOSIS — C641 Malignant neoplasm of right kidney, except renal pelvis: Secondary | ICD-10-CM | POA: Diagnosis not present

## 2018-08-29 DIAGNOSIS — E785 Hyperlipidemia, unspecified: Secondary | ICD-10-CM | POA: Diagnosis not present

## 2018-08-29 LAB — LIPID PANEL
CHOL/HDL RATIO: 1.6 ratio (ref 0.0–4.4)
CHOLESTEROL TOTAL: 160 mg/dL (ref 100–199)
HDL: 100 mg/dL (ref >39)
LDL CALC: 51 mg/dL (ref 0–99)
TRIGLYCERIDES: 46 mg/dL (ref 0–149)
VLDL CHOLESTEROL CAL: 9 mg/dL (ref 5–40)

## 2018-09-03 ENCOUNTER — Ambulatory Visit: Payer: Medicare Other | Admitting: Internal Medicine

## 2018-09-03 ENCOUNTER — Encounter: Payer: Self-pay | Admitting: Internal Medicine

## 2018-09-03 VITALS — BP 136/74 | HR 87 | Ht 63.0 in | Wt 166.8 lb

## 2018-09-03 DIAGNOSIS — E782 Mixed hyperlipidemia: Secondary | ICD-10-CM

## 2018-09-03 DIAGNOSIS — I2510 Atherosclerotic heart disease of native coronary artery without angina pectoris: Secondary | ICD-10-CM

## 2018-09-03 DIAGNOSIS — I10 Essential (primary) hypertension: Secondary | ICD-10-CM

## 2018-09-03 DIAGNOSIS — I4819 Other persistent atrial fibrillation: Secondary | ICD-10-CM

## 2018-09-03 DIAGNOSIS — I481 Persistent atrial fibrillation: Secondary | ICD-10-CM | POA: Diagnosis not present

## 2018-09-03 NOTE — Progress Notes (Signed)
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 Recently, she developed substernal chest pain in the fall and underwent cardiac catheterization. This demonstrated patent LAD stent that was proximal as well as 30% disease of the LAD just prior to that stent, but no other areas of obstructive coronary disease. She continued to have chest pain; however, I felt it was most likely related to stress. I have also encouraged dietary changes as well as trying to reduce her stress and alcohol intake. She has accomplished that to some extent and, over the past 6 months has had no other symptoms of chest discomfort. She occasionally gets short of breath with heavy exertion and has some swelling in her legs only when standing up for long periods of time. She continues to work at [employer] although there is some stress she is actually maintaining her exercise regimen. This is clearly demonstrated improvement in her risk factors including a reduction in cholesterol. A recent lipid NMR demonstrated a particle number of 631 and an LDL cholesterol of 81.   Theresa Mccarty returns for annual followup today. She reports feeling very well. She recently had the shingles vaccine at [facility]. She had some questions today about starting coconut oil glucosamine/chondroitin which I think are fine. She did have one episode of discomfort in her chest after working in regard for which she took nitroglycerin however she noted no improvement in her symptoms. She is very interested in decreasing her Lipitor she does report some soreness in her arm muscles as well as her leg muscles. She believes this is related to the statin. She has not been on any other statin since her stent was placed in 2008. She is interested a recheck of her cholesterol  today as well as a screening hemoglobin A1c - she is nondiabetic.  Finally, she recently had a fall at home which was due to tripping over a table leg. She did have some soreness in her right hand as well as ecchymosis and swelling of her digits and 2 bandages on her right toes.  Mr. Picha has no specific complaints today. She denies any chest pain or worsening shortness of breath. Blood pressure is at goal. She continues to take aspirin and Plavix without any bleeding complications. Cholesterol is due for recheck. She does report some mild calf tenderness which she attributes to a atorvastatin.  06/13/2016  Theresa Mccarty returns today for follow-up. Over the past year she's done fairly well. She's had some struggles with arthritis. She noted that she was having some side effects related to atorvastatin including weakness in her arm which improved after discontinuing the medicine. She says she's had about 8 pound weight loss however we demonstrated only 1-2 pounds in the office. She is currently on red yeast rice and wishes to have a fasting lipid profile today. She denies any significant chest discomfort or worsening shortness of breath. She recently retired from her job and notes less stress in her life. Blood pressure is well-controlled today.  04/20/2017  Theresa Mccarty was seen today in follow-up. Recently she was seen by Kerin Ransom and had symptoms of worsening fatigue and palpitations. She was found to be in A. fib with RVR. He adjusted some medications for rate control including adding diltiazem and started her on Eliquis. She  reports she's been taking Eliquis for the past several weeks however did miss at least one dose possibly 2. Today heart rate is just below 100 and her rhythm is irregular. She reports she feels better but still is fatigued. We discussed the possibility of a cardioversion attempt. She understands she would need at least 3 weeks of uninterrupted anticoagulation or TEE guided approach in order to  do that. She feels that she could be more compliant with medication.  06/16/2017  Theresa Mccarty returns today for follow-up. She underwent cardioversion which was unsuccessful. Subsequently she said that she does not think that she was terribly symptomatic therefore we proceeded rate controlled. I increased her diltiazem and heart rate is well-controlled in the 80s today. She reports no significant fatigue. She is switched from Eliquis to Xarelto due to the fact that she had trouble remembering the second dose of Eliquis. Blood pressure was elevated somewhat initially 154/95 however came down to 138/88. She also reports some abdominal bloating but his recent started on probiotics. She gets some lower extremity swelling but has some evidence of venous insufficiency. She is on hydrochlorothiazide with Diovan.  12/04/2017  Theresa Mccarty was seen today in follow-up.  She continues to be without complaints.  She denies any chest pain or worsening shortness of breath.  She remains in persistent if not permanent atrial fibrillation.  She has been on Xarelto which is been easier for her to remember however she is concerned about the cost, particularly now that she is in the donut hole is causing her about $500 a month.  Blood pressure is pretty well controlled.  She showed me numbers at home indicating blood pressures between 784 and 696 systolic.  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  05/21/2018  Theresa Mccarty was seen today in follow-up.  She denies chest pain or shortness of breath.  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  She is in persistent if not permanent atrial fibrillation.  Her family recently purchased a [place] mobile watch for her to monitor her a-fib and she has the ability to send EKGs to me.  Blood pressure is reasonably well controlled today.  We recently discussed her lipid  profile which in March showed an LDL of 84 with total cholesterol 183.  She has been taking ezetimibe but is statin intolerant.  Her goal LDL is less than 70 given her history of coronary artery disease and she may be a candidate for PCSK9 inhibitor therapy.  In the past she had intolerance to atorvastatin, but did not recall she had tried rosuvastatin.  09/03/2018  Theresa Mccarty returns today for follow-up.  She is now getting up for furniture market next week.  She denies any chest pain or worsening shortness of breath.  She is noted to be in permanent A. fib with rate control.  She says she is tolerated changes in her medications including the recent addition of ezetimibe.  Recent lipid profile indicated total cholesterol 160, HDL 100 LDL 51 and triglycerides 46.  Blood pressure appears well-controlled and she is asymptomatic at this time.  PMHx:  Past Medical History:  Diagnosis Date  . Atrial fibrillation ([place])    a. initially diagnosed in 02/2017. Started on Eliquis  . Back pain, chronic   . Broken leg 1958  . CAD (coronary artery disease)    a. s/p DES to LAD in 2008, patent  by cath in 2012.  [name] Hearing loss    bilateral hearing aids  . History of echocardiogram 09/11/2007   normal  . History of nuclear stress test 11/17/2010   exercise; small fixed anteroseptal defect (?artifact); short run of atrial-tachy during recovery; low risk  . Hyperlipidemia   . Hypertension   . Hypothyroid   . Impaired fasting glucose   . Osteoporosis   . Renal cell carcinoma right; 2011   Dr. Alinda Money, [facility]  . Sleep apnea    on CPAP    Past Surgical History:  Procedure Laterality Date  . ABDOMINAL HYSTERECTOMY  1975   PARTIAL; part of 1 ovary remains  . CARDIAC CATHETERIZATION  10/08/2011   LAD-prox stent patent; no significant obstructive CAD; hi LVF end-diastolic pressure  . CARDIOVERSION N/A 05/12/2017   Procedure: CARDIOVERSION;  Surgeon: [name], MD;  Location: [facility] ENDOSCOPY;   Service: Cardiovascular;  Laterality: N/A;  . CORONARY ANGIOPLASTY WITH STENT PLACEMENT  2008   mid LAD  . IR GENERIC HISTORICAL  08/18/2016   IR RADIOLOGIST EVAL & MGMT 08/18/2016 [name], MD [facility] INTERV RAD  . IR RADIOLOGIST EVAL & MGMT  08/09/2018  . KIDNEY SURGERY  2011   renal carcinoma - cryoablation -  Dr. Alinda Money and Dr. Laural Roes (cryoablation)  . SPINE SURGERY      FAMHx:  Family History  Problem Relation Age of Onset  . Heart disease Mother   . Hypertension Mother   . Arthritis Mother   . Osteoporosis Mother   . Depression Sister   . Hyperlipidemia Sister   . Heart disease Father   . Diabetes Brother   . Cancer Sister        unsure, related to smoking?  . Cancer Sister        lung cancer  . Hyperlipidemia Brother   . Hypertension Brother     SOCHx:   reports that she quit smoking about 49 years ago. She has never used smokeless tobacco. She reports that she drinks about 2.0 standard drinks of alcohol per week. She reports that she does not use drugs.  ALLERGIES:  Allergies  Allergen Reactions  . Atorvastatin Other (See Comments)    Muscle aches  . Celebrex [Celecoxib] Other (See Comments)    Muscle aches  . Nitrofurantoin Nausea Only and Rash    ROS: Pertinent items noted in HPI and remainder of comprehensive ROS otherwise negative.  HOME MEDS: Current Outpatient Medications  Medication Sig Dispense Refill  . alendronate (FOSAMAX) 70 MG tablet Take 1 tablet (70 mg total) by mouth every 7 (seven) days. Take with a full glass of water on an empty stomach. 12 tablet 3  . aspirin EC 81 MG tablet Take 1 tablet (81 mg total) by mouth daily. 90 tablet 3  . B Complex Vitamins (B-COMPLEX/B-12 PO) Take 1 tablet by mouth daily.      . Calcium-Magnesium-Zinc 500-250-12.5 MG TABS Take 1 tablet by mouth daily.    . Cholecalciferol (VITAMIN D) 2000 units tablet Take 2,000 Units by mouth daily.    . Coenzyme Q10 (COQ10) 100 MG CAPS Take 1 tablet by mouth daily.      [name] diltiazem (CARDIZEM CD) 240 MG 24 hr capsule TAKE ONE CAPSULE BY MOUTH EVERY DAY 30 capsule 10  . levothyroxine (SYNTHROID, LEVOTHROID) 50 MCG tablet TAKE 1 TABLET BY MOUTH EVERY DAY 90 tablet 0  . nitroGLYCERIN (NITROSTAT) 0.4 MG SL tablet Place 1 tablet (0.4 mg total) under the tongue  every 5 (five) minutes as needed for chest pain. 90 tablet 1  . Polyvinyl Alcohol-Povidone (REFRESH OP) Place 1 drop into both eyes daily.    . Probiotic Product (PROBIOTIC PO) Take 1 capsule by mouth daily.    . rosuvastatin (CRESTOR) 5 MG tablet Take 1 tablet (5 mg total) by mouth every other day. 45 tablet 3  . valsartan-hydrochlorothiazide (DIOVAN-HCT) 320-25 MG tablet TAKE 1 TABLET BY MOUTH DAILY. 90 tablet 2  . XARELTO 20 MG TABS tablet TAKE 1 TABLET (20 MG TOTAL) BY MOUTH DAILY WITH SUPPER. 90 tablet 1  . ezetimibe (ZETIA) 10 MG tablet Take 1 tablet (10 mg total) by mouth daily. 90 tablet 3   No current facility-administered medications for this visit.     LABS/IMAGING: No results found for this or any previous visit (from the past 48 hour(s)). No results found.  VITALS: BP 136/74 (BP Location: Left Arm, Patient Position: Sitting, Cuff Size: Normal)   Pulse 87   Ht 5\' 3"  (1.6 m)   Wt 166 lb 12.8 oz (75.7 kg)   BMI 29.55 kg/m   EXAM: General appearance: alert and no distress Neck: no carotid bruit and no JVD Lungs: clear to auscultation bilaterally Heart: irregularly irregular rhythm Abdomen: soft, non-tender; bowel sounds normal; no masses,  no organomegaly Extremities: extremities normal, atraumatic, no cyanosis or edema Pulses: 2+ and symmetric Skin: Skin color, texture, turgor normal. No rashes or lesions Neurologic: Grossly normal Psych: Pleasant  EKG: Atrial fibrillation 87, nonspecific T wave changes-personally reviewed  ASSESSMENT: 1. Persistent atrial fibrillation with controlled ventricular response - failed cardioversion 2. CHADSVASC score of 4 on Xarelto 3. Coronary  artery disease status post PCI of the proximal LAD in 2008 4. Hypertension - controlled 5. Hyperlipidemia -not at goal LDL less than 70 6. Statin intolerance 7. Obstructive sleep apnea on CPAP 8. Arthritis  PLAN: 1.   Theresa Mccarty continues to be rate controlled with A. fib and is anticoagulated on Xarelto.  She had a good clinical response with addition of ezetimibe to rosuvastatin which she can only take every other day.  In fact, she takes it on Monday, Wednesday, and Friday.  Now her LDL is at goal.  We will plan to continue her current medications.  Have encouraged her to continue to remain active and exercise as regular as possible.  Follow-up 6 months.  [name], MD, [facility], [place] Director of the Advanced Lipid Disorders &  Cardiovascular Risk Reduction Clinic Attending Cardiologist  Direct Dial: [phone]  Fax: [phone]  Website:  [email or web address] 09/03/2018, 10:48 AM

## 2018-09-03 NOTE — Patient Instructions (Signed)
Your physician wants you to follow-up in: 6 months with [name]. You will receive a reminder via [place]. Please call our office to schedule your appointment once this notice is received.

## 2018-09-27 ENCOUNTER — Other Ambulatory Visit: Payer: Self-pay | Admitting: Family Medicine

## 2018-09-27 DIAGNOSIS — E039 Hypothyroidism, unspecified: Secondary | ICD-10-CM

## 2018-09-28 ENCOUNTER — Other Ambulatory Visit: Payer: Self-pay | Admitting: Internal Medicine

## 2018-10-15 DIAGNOSIS — Z85828 Personal history of other malignant neoplasm of skin: Secondary | ICD-10-CM | POA: Diagnosis not present

## 2018-10-15 DIAGNOSIS — D225 Melanocytic nevi of trunk: Secondary | ICD-10-CM | POA: Diagnosis not present

## 2018-10-15 DIAGNOSIS — L821 Other seborrheic keratosis: Secondary | ICD-10-CM | POA: Diagnosis not present

## 2018-10-26 DIAGNOSIS — G4733 Obstructive sleep apnea (adult) (pediatric): Secondary | ICD-10-CM | POA: Diagnosis not present

## 2018-10-29 ENCOUNTER — Other Ambulatory Visit: Payer: Medicare Other

## 2018-11-30 ENCOUNTER — Other Ambulatory Visit: Payer: Self-pay | Admitting: Internal Medicine

## 2018-12-04 NOTE — Patient Instructions (Addendum)
  HEALTH MAINTENANCE RECOMMENDATIONS:  It is recommended that you get at least 30 minutes of aerobic exercise at least 5 days/week (for weight loss, you may need as much as 60-90 minutes). This can be any activity that gets your heart rate up. This can be divided in 10-15 minute intervals if needed, but try and build up your endurance at least once a week.  Weight bearing exercise is also recommended twice weekly.  Eat a healthy diet with lots of vegetables, fruits and fiber.  "Colorful" foods have a lot of vitamins (ie green vegetables, tomatoes, red peppers, etc).  Limit sweet tea, regular sodas and alcoholic beverages, all of which has a lot of calories and sugar.  Up to 1 alcoholic drink daily may be beneficial for women (unless trying to lose weight, watch sugars).  Drink a lot of water.  Calcium recommendations are 1200-1500 mg daily (1500 mg for postmenopausal women or women without ovaries), and vitamin D 1000 IU daily.  This should be obtained from diet and/or supplements (vitamins), and calcium should not be taken all at once, but in divided doses.  Monthly self breast exams and yearly mammograms for women over the age of 38 is recommended.  Sunscreen of at least SPF 30 should be used on all sun-exposed parts of the skin when outside between the hours of 10 am and 4 pm (not just when at beach or pool, but even with exercise, golf, tennis, and yard work!)  Use a sunscreen that says "broad spectrum" so it covers both UVA and UVB rays, and make sure to reapply every 1-2 hours.  Remember to change the batteries in your smoke detectors when changing your clock times in the spring and fall.  Use your seat belt every time you are in a car, and please drive safely and not be distracted with cell phones and texting while driving.   Theresa Mccarty , Thank you for taking time to come for your Medicare Wellness Visit. I appreciate your ongoing commitment to your health goals. Please review the following  plan we discussed and let me know if I can assist you in the future.    This is a list of the screening recommended for you and due dates:  Health Maintenance  Topic Date Due  . Flu Shot  07/19/2018  . Tetanus Vaccine  08/09/2022  . DEXA scan (bone density measurement)  Completed  . Pneumonia vaccines  Completed   You got your flu shot today. Try and get it in September/October in the future. Next mammogram is due 12/2018. Next Bone Density test is due 12/2019.  I recommend getting the new shingles vaccine (Shingrix). You will need to check with your insurance to see if it is covered, and if covered by Medicare Part D, you need to get from the pharmacy rather than our office.  It is a series of 2 injections, spaced 2 months apart.  Please schedule your dental visit.  Please get your Living Will and healthcare power of attorney notarized, and give [place] a copy to get scanned into your [facility] chart.  Please cut back on alcohol to no more than 1 glass of wine/day.  You may use over-the-counter hydrocortisone cream twice daily, if needed, for vaginal itching (externally).

## 2018-12-04 NOTE — Progress Notes (Signed)
Chief Complaint  Patient presents with  . Medicare Wellness    fasting AWV/CPE with pelvic exam. Sees Dr.Shapiro for eye exams and is UTD. Has not gotten the shingles vaccines yet. Has itching at her vaginal hairline that is worse at night. Has a question about AZO test strips and the results.     Theresa Mccarty is a 77 y.o. female who presents for annual wellness visit and follow-up on chronic medical conditions.  She has the following concerns:  External vaginal itching at night, gets sore from scratching. She applies vaseline when she wakes up, which often helps.  Denies vaginal discharge. She sometimes uses cortaid, which is very helpful, and can go many days without itching.  She can tells if she has urinary tract infection.  A couple of months ago, she woke up 4 in the morning with urgency, frequency, a slight "tinge". She used OTC test strips--there was a persistent abnormality (brown), but can't recall what it meant. She recalls that if it was pink, it says "see doctor". She currently denies any urinary symptoms. She meant to bring them for me to see (and explain what it meant), and forgot. She drank cranberry juice and used AZO pills, and symptoms resolved.   Currently denies any urinary complaints.   Osteoporosis:  Last DEXA was in 12/2017, showing osteoporosis, with T-2.7 at R fem neck. This was done at the [facility], a different location than prior DEXA's, due to [place] no longer taking her insurance. Previously treated with fosamax and Boniva. She has been off meds for at least7-8years. She doesn't recall why the medication was stopped, denies side effects(made her tired/draggy?). Her DEXA in 2009 was while still taking Boniva, and she had another DEXA 10/2012 which showed T-2.4 and decline in bone density at the left hip. She restarted alendronate weekly in 01/2018.  She is tolerating this without side effects. Denies dysphagia, chest pain.   Hypothyroidism: Compliant with  taking thyroid medications on an empty stomach, separate from other medications. She feels well, denies any thyroid-related symptoms. No hair/skin/bowel/mood/energy/weight changes. She just recently regained some of the weight she lost due to dietary indiscretion recently. Lab Results  Component Value Date   TSH 1.81 11/30/2017   H/o Renal Cell Cancer, treated in 07/2010 with cryoablation. She sees Dr. Alinda Money yearly, last in 08/2018, and had MRI in 07/2018 (and seen by Dr. Kathlene Cote): IMPRESSION: 1. Exam detail is diminished secondary to respiratory motion artifact 2. Stable postprocedure changes of prior cryoablation involving the medial aspect of the upper pole of right kidney. No findings to suggest local recurrence of disease. 3. Multiple kidney are identified in both kidneys. The respiratory motion makes evaluation of these lesions specially difficult. Within the upper pole of the right kidney there is a septated cyst which appears increased in size from previous exam which exhibits equivocal internal enhancement versus volume averaging with adjacent medullary pyramid. Advise close interval follow-up of this lesion. There are several lesions in the left kidney which are complicated by hemorrhage. No definitive enhancement identified within these structures. 4. Again noted are numerous cysts scattered throughout the pancreas. These have a benign nonaggressive appearance. One in the head of pancreas appears new. Based on the size of the cysts and age of patient suggest follow-up imaging in 2 years with repeat contrast enhanced MRI of the abdomen. This recommendation follows ACR consensus guidelines: Management of Incidental Pancreatic Cysts: A White Paper of the ACR Incidental Findings Committee. [place] 1194;17:408-144.  5. Hepatic steatosis 6. Liver cysts.  She reports she was told to f/u in 1 year with them both.  Hypertension follow-up: Blood pressures haven't been  checked regularly over the last 2 months. Had been normal when checking prior to that. Denies dizziness, headaches, chest pain. Denies side effects of medications. She has had some slight headache and neck tightness which she relates to stress.  CAD, hyperlipidemia and atrial fibrillation:She is under the care of [name], last seen in September (and scheduled again for 02/2019).  CAD, s/p PCI of proximal LAD in 2008.[name] also manages her lipids and atrial fibrillation. Failed cardioversion in 04/2017. She is on Diltiazem and Xarelto.  She denies tachycardia, palpitations, chest pain, shortness of breath. She denies significant bleeding or bruising (bruises easily, not any different). She was having trouble tolerating atorvastatin due to calf pain. She originally was changed to red yeast rice, but in 11/2017 was started on Zetia, and in 05/2018 Crestor 74m qod (she takes MWF) was added. She has a lot of muscular pain, but has been doing lifting, on her feet a lot, busy with market--isn't really sure if it is related to her medication or not.  It is tolerable. Aspercreme helps her back (needs when working)  Lab Results  Component Value Date   CHOL 160 08/29/2018   HDL 100 08/29/2018   LDLCALC 51 08/29/2018   TRIG 46 08/29/2018   CHOLHDL 1.6 08/29/2018   OSA--on CPAP; reports compliance with use of CPAP. Denies significant daytime somnolence (except when she has a poor night sleep due to insomnia). She sometimes sees a red dot (instead of 2 green smiley faces) on the machine when she wakes up, which is related to airflow.  Most of the time it is green, and she feels great. (once had a leak in her mask, and that was fixed).   Immunization History  Administered Date(s) Administered  . Influenza, High Dose Seasonal PF 10/16/2013, 12/01/2014, 09/24/2015, 08/18/2016, 08/30/2017  . Pneumococcal Conjugate-13 08/06/2014  . Pneumococcal Polysaccharide-23 08/09/2012  . Td 01/04/2006  . Tdap  08/09/2012  . Zoster 02/16/2014   Last Pap smear: 2009; S/p hysterectomy in '75  Last mammogram: 12/2017 Last colonoscopy: 07/26/11  Last DEXA:  12/2017, showing osteoporosis, with T-2.7 at R fem neck. Dentist: past due, many years. She has dental coverage, still hasn't scheduled. Ophtho:Yearly. Has cataracts, glasses. Exercise:Treadmill 35 minutes at least 5x/week, through October, than got busy with furniture market.  Hasn't gotten much regular exercise since [place] [employer]. Lifts heavy flower arrangements/bowls.  Has some handweights, only usually occasionally. She has treadmill back in place, but hasn't gotten back on yet.   Other doctors caring for patient include: Dr. BAlinda Money(urology) and Dr. YKathlene CoteDr. [name](cardiology) Dr. KClaiborne Billingsfor OSA(hasn't seen in a while) [facility] Dr. SGershon Crane(ophtho) [facility] for hearing (had hearing aids) Doesn't have a dentist  Depression screen: negative Fall screen:none Functional Status survey: See screen in epic. +hearing aid, thinks needs it rechecked, some issues with her glasses/vision (has cataracts). Some trouble with stairs due to afib and her knees Mini-Cog screen: Normal  End of Life Discussion: Patient hasa living will and medical power of attorney, but hasn't yet gotten it notarized.  Last year reported that she planned to do at the bank soon and drop it off. (hasn't yet)  Past Medical History:  Diagnosis Date  . [place]rial [place]    a. initially diagnosed in 02/2017. Started on Eliquis  .  Back pain, chronic   . Broken leg 1958  . CAD (coronary artery disease)    a. s/p DES to LAD in 2008, patent by cath in 2012.  [name] Hearing loss    bilateral hearing aids  . History of echocardiogram 09/11/2007   normal  . History of nuclear stress test 11/17/2010   exercise; small fixed anteroseptal defect (?artifact); short run of atrial-tachy during recovery; low risk  . Hyperlipidemia   . Hypertension   .  Hypothyroid   . Impaired fasting glucose   . Osteoporosis   . Renal cell carcinoma right; 2011   Dr. Alinda Money, [facility]  . Sleep apnea    on CPAP    Past Surgical History:  Procedure Laterality Date  . ABDOMINAL HYSTERECTOMY  1975   PARTIAL; part of 1 ovary remains  . CARDIAC CATHETERIZATION  10/08/2011   LAD-prox stent patent; no significant obstructive CAD; hi LVF end-diastolic pressure  . CARDIOVERSION N/A 05/12/2017   Procedure: CARDIOVERSION;  Surgeon: [name], MD;  [facility]: [facility] ENDOSCOPY;  Service: Cardiovascular;  Laterality: N/A;  . CORONARY ANGIOPLASTY WITH STENT PLACEMENT  2008   mid LAD  . IR GENERIC HISTORICAL  08/18/2016   IR RADIOLOGIST EVAL & MGMT 08/18/2016 [name], MD [facility] INTERV RAD  . IR RADIOLOGIST EVAL & MGMT  08/09/2018  . KIDNEY SURGERY  2011   renal carcinoma - cryoablation -  Dr. Alinda Money and Dr. Laural Roes (cryoablation)  . SPINE SURGERY      Social History   Socioeconomic History  . Marital status: Married    Spouse name: Not on file  . Number of children: 2  . Years of education: Not on file  . Highest education level: Not on file  Occupational History  . Occupation: retired Teacher, music: [employer]  Social Needs  . Financial resource strain: Not on file  . Food insecurity:    Worry: Not on file    Inability: Not on file  . Transportation needs:    Medical: Not on file    Non-medical: Not on file  Tobacco Use  . Smoking status: Former Smoker    Last attempt to quit: 12/19/1968    Years since quitting: 49.9  . Smokeless tobacco: Never Used  Substance and Sexual Activity  . Alcohol use: Yes    Alcohol/week: 2.0 standard drinks    Types: 2 Glasses of wine per week    Comment: 2-3 glasses of wine per day.   . Drug use: No  . Sexual activity: Not Currently  Lifestyle  . Physical activity:    Days per week: Not on file    Minutes per session: Not on file  . Stress: Not on file  Relationships  . Social  connections:    Talks on phone: Not on file    Gets together: Not on file    Attends religious service: Not on file    Active member of club or organization: Not on file    Attends meetings of clubs or organizations: Not on file    Relationship status: Not on file  . Intimate partner violence:    Fear of current or ex partner: Not on file    Emotionally abused: Not on file    Physically abused: Not on file    Forced sexual activity: Not on file  Other Topics Concern  . Not on file  Social History Narrative   Lives with husband.  2 sons live near  [address]. Retired from working for [employer] 02/2016. 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 Heart disease Mother   . Hypertension Mother   . Arthritis Mother   . Osteoporosis Mother   . Depression Sister   . Hyperlipidemia Sister   . Heart disease Father   . Diabetes Brother   . Cancer Sister        unsure, related to smoking?  . Cancer Sister        lung cancer  . Hyperlipidemia Brother   . Hypertension Brother     Outpatient Encounter Medications as of 12/05/2018  Medication Sig Note  . alendronate (FOSAMAX) 70 MG tablet Take 1 tablet (70 mg total) by mouth every 7 (seven) days. Take with a full glass of water on an empty stomach.   [name] aspirin EC 81 MG tablet Take 1 tablet (81 mg total) by mouth daily.   . B Complex Vitamins (B-COMPLEX/B-12 PO) Take 1 tablet by mouth daily.     . Calcium-Magnesium-Zinc 500-250-12.5 MG TABS Take 1 tablet by mouth daily.   . Cholecalciferol (VITAMIN D) 2000 units tablet Take 2,000 Units by mouth daily.   . Coenzyme Q10 (COQ10) 100 MG CAPS Take 1 tablet by mouth daily.    [name] diltiazem (CARDIZEM CD) 240 MG 24 hr capsule TAKE ONE CAPSULE BY MOUTH EVERY DAY   . ezetimibe (ZETIA) 10 MG tablet Take 1 tablet (10 mg total) by mouth daily.   [name] levothyroxine  (SYNTHROID, LEVOTHROID) 50 MCG tablet TAKE 1 TABLET BY MOUTH EVERY DAY   . MILK THISTLE PO Take 1 capsule by mouth daily.   . Polyvinyl Alcohol-Povidone (REFRESH OP) Place 1 drop into both eyes daily.   . Probiotic Product (PROBIOTIC PO) Take 1 capsule by mouth daily.   . rosuvastatin (CRESTOR) 5 MG tablet Take 1 tablet (5 mg total) by mouth every other day. 12/05/2018: Takes MWF  . valsartan-hydrochlorothiazide (DIOVAN-HCT) 320-25 MG tablet TAKE 1 TABLET BY MOUTH DAILY.   [name] XARELTO 20 MG TABS tablet TAKE 1 TABLET BY MOUTH DAILY WITH SUPPER.   . nitroGLYCERIN (NITROSTAT) 0.4 MG SL tablet Place 1 tablet (0.4 mg total) under the tongue every 5 (five) minutes as needed for chest pain. (Patient not taking: Reported on 12/05/2018)    No facility-administered encounter medications on file as of 12/05/2018.     Allergies  Allergen Reactions  . Atorvastatin Other (See Comments)    Muscle aches  . Celebrex [Celecoxib] Other (See Comments)    Muscle aches  . Nitrofurantoin Nausea Only and Rash   ROS: The patient denies anorexia, fever, headaches, vision changes, ear pain, sore throat, breast concerns, chest pain, palpitations, dizziness, syncope, dyspnea on exertion, cough, swelling, nausea, vomiting, diarrhea, constipation, abdominal pain, melena, hematochezia, indigestion/heartburn, hematuria, incontinence, dysuria, vaginal bleeding, discharge, odor, genital lesions, new joint pains (chronic back, knees), numbness, tingling, weakness, tremor, suspicious skin lesions, depression, anxiety, abnormal bleeding or enlarged lymph nodes. +easy bruising (unchanged) +hearing loss-- hashearing aids. Mild allergies with weather changes, no significant symptoms currently. Occasional cough (thinks related to the dust, silk flowers) Some itching on external vaginal area at night, per HPI   PHYSICAL EXAM:  BP 130/74   Pulse 88   Ht 5' 2.5" (1.588 m)   Wt 166 lb (75.3 kg)   BMI 29.88 kg/m   Wt  Readings from Last 3 Encounters:  12/05/18 166 lb (75.3 kg)  09/03/18 166 lb 12.8 oz (75.7 kg)  08/09/18 161 lb (73 kg)    General Appearance:  Alert, cooperative, no distress, appears stated age   Head:  Normocephalic, without obvious abnormality, atraumatic   Eyes:  PERRL, conjunctiva/corneas clear, EOM's intact, fundi benign. Mild ptosis of upper lid on the right.  Ears:  Normal TM's and EAC's; no cerumen  Nose:  Nares normal, mucosa normal, no drainage or sinus tenderness   Throat:  Lips, mucosa, and tongue normal; teeth and gums normal   Neck:  Supple, no lymphadenopathy; thyroid: no enlargement/tenderness/nodules; no carotid bruit or JVD   Back:  Spine nontender, no curvature, ROM normal, no CVA tenderness   Lungs:  Clear to auscultation bilaterally without wheezes, rales or ronchi; respirations unlabored   Chest Wall:  No tenderness or deformity   Heart:  Irregularly irregular, normal rate, S1 and S2 normal, no murmur, rub or gallop   Breast Exam:  No tenderness, masses, or nipple discharge or inversion. No axillary lymphadenopathy   Abdomen:  Soft, non-tender, nondistended, normoactive bowel sounds, no masses, no hepatosplenomegaly   Genitalia:  Normal external genitalia, some atrophic changes noted. Some healing excoriations on the labia majora (minima residual erythema) BUS and vagina normal. No abnormal vaginal discharge. Uterus surgically absent and adnexa not palpable--nontender, no masses. Pap not performed   Rectal:  Normal tone, no masses or tenderness; guaiac negative stool   Extremities:  No clubbing, cyanosis or edema. Some white discoloration/thickening of a few nails bilaterally   Pulses:  2+ and symmetric all extremities   Skin:  Skin color, texture, turgor normal. Actinic changes to skin throughout; small ecchymosis on hands/forearms and lower legs.  Lymph nodes:  Cervical, supraclavicular, and axillary nodes normal   Neurologic:   CNII-XII intact, normal strength, sensation and gait; reflexes 2+ and symmetric throughout    Psych: Normal mood, affect, hygiene and grooming   Normal urine dip (just trace urobilinogen).   ASSESSMENT/PLAN:   Annual physical exam - Plan: POCT Urinalysis DIP (Proadvantage Device)  Medicare annual wellness visit, subsequent  Osteoporosis, unspecified osteoporosis type, unspecified pathological fracture presence - Continue alendronate, Calcium, D, weight-bearing exercise  Hypothyroidism, unspecified type - Plan: TSH  Essential hypertension, benign - Plan: Comprehensive metabolic panel  Pure hypercholesterolemia - Plan: Comprehensive metabolic panel  OSA on CPAP  Coronary artery disease due to lipid rich plaque  Persistent atrial fibrillation  Encounter for current long-term use of anticoagulants  Medication monitoring encounter - Plan: Comprehensive metabolic panel, CBC with Differential/Platelet, TSH  Need for influenza vaccination - Plan: Flu vaccine HIGH DOSE PF (Fluzone High dose)   CBC, c-met, TSH  Discussed monthly self breast exams and yearly mammograms; at least 30 minutes of aerobic activity at least 5 days/week, weight-bearing exercise 2x/week; proper sunscreen use reviewed; healthy diet, including goals of calcium and vitamin D intake and alcohol recommendations (less than or equal to 1 drink/day) reviewed; regular seatbelt use; changing batteries in smoke detectors. Immunization recommendations UTD, continue yearly high dose flu,shot, given today. Recommended Shingrix, counseled re: side effects/efficacy, to get from pharmacy.Colonoscopy recommendations reviewed, UTD   Reminded to get Living will, healthcare POAnotarized and to bring [place] copy. Full Code, Full care MOST form reviewed and updated  Encouraged to cut back on alcoholto just 1 glass of wine/alcoholic beverage daily   Medicare Attestation I have personally reviewed: The patient's  medical and social history Their use of alcohol, tobacco  or illicit drugs Their current medications and supplements The patient's functional ability including ADLs,fall risks, home safety risks, cognitive, and hearing and visual impairment Diet and physical activities Evidence for depression or mood disorders  The patient's weight, height and BMI, have been recorded in the chart.  I have made referrals, counseling, and provided education to the patient based on review of the above and I have provided the patient with a written personalized care plan for preventive services.

## 2018-12-05 ENCOUNTER — Encounter: Payer: Self-pay | Admitting: Family Medicine

## 2018-12-05 ENCOUNTER — Ambulatory Visit (INDEPENDENT_AMBULATORY_CARE_PROVIDER_SITE_OTHER): Payer: Medicare Other | Admitting: Family Medicine

## 2018-12-05 VITALS — BP 130/74 | HR 88 | Ht 62.5 in | Wt 166.0 lb

## 2018-12-05 DIAGNOSIS — I10 Essential (primary) hypertension: Secondary | ICD-10-CM

## 2018-12-05 DIAGNOSIS — Z5181 Encounter for therapeutic drug level monitoring: Secondary | ICD-10-CM | POA: Diagnosis not present

## 2018-12-05 DIAGNOSIS — Z9989 Dependence on other enabling machines and devices: Secondary | ICD-10-CM

## 2018-12-05 DIAGNOSIS — M810 Age-related osteoporosis without current pathological fracture: Secondary | ICD-10-CM | POA: Diagnosis not present

## 2018-12-05 DIAGNOSIS — E7800 Pure hypercholesterolemia, unspecified: Secondary | ICD-10-CM | POA: Diagnosis not present

## 2018-12-05 DIAGNOSIS — Z7901 Long term (current) use of anticoagulants: Secondary | ICD-10-CM

## 2018-12-05 DIAGNOSIS — E039 Hypothyroidism, unspecified: Secondary | ICD-10-CM

## 2018-12-05 DIAGNOSIS — Z0000 Encounter for general adult medical examination without abnormal findings: Secondary | ICD-10-CM | POA: Diagnosis not present

## 2018-12-05 DIAGNOSIS — Z23 Encounter for immunization: Secondary | ICD-10-CM

## 2018-12-05 DIAGNOSIS — G4733 Obstructive sleep apnea (adult) (pediatric): Secondary | ICD-10-CM | POA: Diagnosis not present

## 2018-12-05 DIAGNOSIS — I4819 Other persistent atrial fibrillation: Secondary | ICD-10-CM

## 2018-12-05 DIAGNOSIS — I2510 Atherosclerotic heart disease of native coronary artery without angina pectoris: Secondary | ICD-10-CM

## 2018-12-05 DIAGNOSIS — I2583 Coronary atherosclerosis due to lipid rich plaque: Secondary | ICD-10-CM

## 2018-12-05 LAB — POCT URINALYSIS DIP (PROADVANTAGE DEVICE)
Bilirubin, UA: NEGATIVE
Glucose, UA: NEGATIVE mg/dL
Ketones, POC UA: NEGATIVE mg/dL
Leukocytes, UA: NEGATIVE
NITRITE UA: NEGATIVE
PH UA: 7.5 (ref 5.0–8.0)
PROTEIN UA: NEGATIVE mg/dL
RBC UA: NEGATIVE
SPECIFIC GRAVITY, URINE: 1.01

## 2018-12-06 LAB — COMPREHENSIVE METABOLIC PANEL
ALBUMIN: 4.6 g/dL (ref 3.5–4.8)
ALK PHOS: 70 IU/L (ref 39–117)
ALT: 23 IU/L (ref 0–32)
AST: 22 IU/L (ref 0–40)
Albumin/Globulin Ratio: 1.9 (ref 1.2–2.2)
BILIRUBIN TOTAL: 0.6 mg/dL (ref 0.0–1.2)
BUN / CREAT RATIO: 17 (ref 12–28)
BUN: 12 mg/dL (ref 8–27)
CHLORIDE: 93 mmol/L — AB (ref 96–106)
CO2: 25 mmol/L (ref 20–29)
CREATININE: 0.72 mg/dL (ref 0.57–1.00)
Calcium: 9.4 mg/dL (ref 8.7–10.3)
GFR calc Af Amer: 93 mL/min/{1.73_m2} (ref >59)
GFR calc non Af Amer: 81 mL/min/{1.73_m2} (ref >59)
GLUCOSE: 100 mg/dL — AB (ref 65–99)
Globulin, Total: 2.4 g/dL (ref 1.5–4.5)
Potassium: 4.2 mmol/L (ref 3.5–5.2)
Sodium: 134 mmol/L (ref 134–144)
Total Protein: 7 g/dL (ref 6.0–8.5)

## 2018-12-06 LAB — CBC WITH DIFFERENTIAL/PLATELET
BASOS ABS: 0 10*3/uL (ref 0.0–0.2)
Basos: 1 %
EOS (ABSOLUTE): 0.1 10*3/uL (ref 0.0–0.4)
Eos: 2 %
HEMOGLOBIN: 13.7 g/dL (ref 11.1–15.9)
Hematocrit: 38.9 % (ref 34.0–46.6)
IMMATURE GRANULOCYTES: 0 %
Immature Grans (Abs): 0 10*3/uL (ref 0.0–0.1)
LYMPHS ABS: 1.2 10*3/uL (ref 0.7–3.1)
Lymphs: 22 %
MCH: 33.1 pg — ABNORMAL HIGH (ref 26.6–33.0)
MCHC: 35.2 g/dL (ref 31.5–35.7)
MCV: 94 fL (ref 79–97)
MONOCYTES: 10 %
Monocytes Absolute: 0.5 10*3/uL (ref 0.1–0.9)
NEUTROS PCT: 65 %
Neutrophils Absolute: 3.4 10*3/uL (ref 1.4–7.0)
Platelets: 276 10*3/uL (ref 150–450)
RBC: 4.14 x10E6/uL (ref 3.77–5.28)
RDW: 12.1 % — AB (ref 12.3–15.4)
WBC: 5.2 10*3/uL (ref 3.4–10.8)

## 2018-12-06 LAB — TSH: TSH: 1.78 u[IU]/mL (ref 0.450–4.500)

## 2018-12-09 ENCOUNTER — Other Ambulatory Visit: Payer: Self-pay | Admitting: Internal Medicine

## 2018-12-09 DIAGNOSIS — E785 Hyperlipidemia, unspecified: Secondary | ICD-10-CM

## 2018-12-23 ENCOUNTER — Other Ambulatory Visit: Payer: Self-pay | Admitting: Family Medicine

## 2018-12-23 DIAGNOSIS — E039 Hypothyroidism, unspecified: Secondary | ICD-10-CM

## 2018-12-30 ENCOUNTER — Other Ambulatory Visit: Payer: Self-pay | Admitting: Family Medicine

## 2018-12-30 DIAGNOSIS — M810 Age-related osteoporosis without current pathological fracture: Secondary | ICD-10-CM

## 2019-01-02 ENCOUNTER — Other Ambulatory Visit: Payer: Self-pay | Admitting: Internal Medicine

## 2019-01-02 DIAGNOSIS — E785 Hyperlipidemia, unspecified: Secondary | ICD-10-CM

## 2019-01-02 DIAGNOSIS — Z0000 Encounter for general adult medical examination without abnormal findings: Secondary | ICD-10-CM

## 2019-01-02 DIAGNOSIS — I10 Essential (primary) hypertension: Secondary | ICD-10-CM

## 2019-01-02 NOTE — Telephone Encounter (Signed)
Rx request sent to pharmacy.  

## 2019-03-07 ENCOUNTER — Ambulatory Visit: Payer: Medicare Other | Admitting: Internal Medicine

## 2019-03-21 ENCOUNTER — Other Ambulatory Visit: Payer: Self-pay | Admitting: Internal Medicine

## 2019-03-21 NOTE — Telephone Encounter (Signed)
Diltiazem CD refilled.

## 2019-04-12 ENCOUNTER — Telehealth: Payer: Self-pay

## 2019-04-12 NOTE — Telephone Encounter (Signed)
Called patient to discuss her upcoming appointment on 04/18/19.  Patient requested to cancel her appointment at this time. Advised patient it would be August 2020 before patients are seen in the office. Patient verbalized understanding. Appointment cancelled and rescheduled for 08/13/19 at 8:30a.

## 2019-04-18 ENCOUNTER — Ambulatory Visit: Payer: Medicare Other | Admitting: Internal Medicine

## 2019-04-24 ENCOUNTER — Other Ambulatory Visit: Payer: Self-pay | Admitting: Internal Medicine

## 2019-05-23 ENCOUNTER — Other Ambulatory Visit: Payer: Self-pay | Admitting: Internal Medicine

## 2019-05-23 DIAGNOSIS — E785 Hyperlipidemia, unspecified: Secondary | ICD-10-CM

## 2019-06-08 ENCOUNTER — Other Ambulatory Visit: Payer: Self-pay | Admitting: Internal Medicine

## 2019-06-08 DIAGNOSIS — E785 Hyperlipidemia, unspecified: Secondary | ICD-10-CM

## 2019-07-12 ENCOUNTER — Other Ambulatory Visit: Payer: Self-pay

## 2019-07-12 NOTE — Patient Outreach (Signed)
[place] [facility]) Care Management  07/12/2019  NIL BOLSER 10-04-1941 [id number]   Medication Adherence call to Mrs. Romie Levee HIPPA Compliant Voice message left with a call back number. Mrs. Pair is showing past due on Rosuvastatin 5 mg under [place].   [place] Management Direct Dial [phone]  Fax [phone] [email or web address]

## 2019-07-15 ENCOUNTER — Other Ambulatory Visit: Payer: Self-pay

## 2019-07-15 NOTE — Patient Outreach (Signed)
[facility] [facility]) Care Management  07/15/2019  Theresa Mccarty Dec 28, 1940 [id number]   Medication Adherence call back from [name] spoke with patient she is showing past due on Rosuvastatin 5 mg patient explain she is no longer taking this medication because of side effects.patient will she her doctor next month and will ask about it. [name] is showing past due under [place].  [place] Management Direct Dial [phone]  Fax [phone] [email or web address]

## 2019-07-16 ENCOUNTER — Other Ambulatory Visit: Payer: Self-pay | Admitting: Family Medicine

## 2019-07-16 DIAGNOSIS — E039 Hypothyroidism, unspecified: Secondary | ICD-10-CM

## 2019-07-16 NOTE — Telephone Encounter (Signed)
Is this ok to refill?  

## 2019-07-18 DIAGNOSIS — D2261 Melanocytic nevi of right upper limb, including shoulder: Secondary | ICD-10-CM | POA: Diagnosis not present

## 2019-07-18 DIAGNOSIS — L570 Actinic keratosis: Secondary | ICD-10-CM | POA: Diagnosis not present

## 2019-07-18 DIAGNOSIS — D2262 Melanocytic nevi of left upper limb, including shoulder: Secondary | ICD-10-CM | POA: Diagnosis not present

## 2019-07-18 DIAGNOSIS — Z85828 Personal history of other malignant neoplasm of skin: Secondary | ICD-10-CM | POA: Diagnosis not present

## 2019-07-18 DIAGNOSIS — D485 Neoplasm of uncertain behavior of skin: Secondary | ICD-10-CM | POA: Diagnosis not present

## 2019-07-25 ENCOUNTER — Encounter: Payer: Self-pay | Admitting: Family Medicine

## 2019-07-30 DIAGNOSIS — H2513 Age-related nuclear cataract, bilateral: Secondary | ICD-10-CM | POA: Diagnosis not present

## 2019-07-30 DIAGNOSIS — H5213 Myopia, bilateral: Secondary | ICD-10-CM | POA: Diagnosis not present

## 2019-07-30 DIAGNOSIS — H52203 Unspecified astigmatism, bilateral: Secondary | ICD-10-CM | POA: Diagnosis not present

## 2019-07-30 DIAGNOSIS — H524 Presbyopia: Secondary | ICD-10-CM | POA: Diagnosis not present

## 2019-08-13 ENCOUNTER — Other Ambulatory Visit: Payer: Self-pay

## 2019-08-13 ENCOUNTER — Encounter: Payer: Self-pay | Admitting: Internal Medicine

## 2019-08-13 ENCOUNTER — Ambulatory Visit (INDEPENDENT_AMBULATORY_CARE_PROVIDER_SITE_OTHER): Payer: Medicare Other | Admitting: Internal Medicine

## 2019-08-13 VITALS — BP 150/90 | HR 90 | Ht 62.5 in | Wt 163.6 lb

## 2019-08-13 DIAGNOSIS — I4819 Other persistent atrial fibrillation: Secondary | ICD-10-CM | POA: Diagnosis not present

## 2019-08-13 DIAGNOSIS — R0789 Other chest pain: Secondary | ICD-10-CM

## 2019-08-13 DIAGNOSIS — G4733 Obstructive sleep apnea (adult) (pediatric): Secondary | ICD-10-CM

## 2019-08-13 DIAGNOSIS — Z9989 Dependence on other enabling machines and devices: Secondary | ICD-10-CM

## 2019-08-13 DIAGNOSIS — E039 Hypothyroidism, unspecified: Secondary | ICD-10-CM | POA: Diagnosis not present

## 2019-08-13 DIAGNOSIS — E782 Mixed hyperlipidemia: Secondary | ICD-10-CM | POA: Diagnosis not present

## 2019-08-13 LAB — LIPID PANEL
Chol/HDL Ratio: 2.3 ratio (ref 0.0–4.4)
Cholesterol, Total: 181 mg/dL (ref 100–199)
HDL: 78 mg/dL (ref >39)
LDL Calculated: 93 mg/dL (ref 0–99)
Triglycerides: 52 mg/dL (ref 0–149)
VLDL Cholesterol Cal: 10 mg/dL (ref 5–40)

## 2019-08-13 LAB — TSH: TSH: 2.64 u[IU]/mL (ref 0.450–4.500)

## 2019-08-13 NOTE — Progress Notes (Addendum)
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 Recently, she developed substernal chest pain in the fall and underwent cardiac catheterization. This demonstrated patent LAD stent that was proximal as well as 30% disease of the LAD just prior to that stent, but no other areas of obstructive coronary disease. She continued to have chest pain; however, I felt it was most likely related to stress. I have also encouraged dietary changes as well as trying to reduce her stress and alcohol intake. She has accomplished that to some extent and, over the past 6 months has had no other symptoms of chest discomfort. She occasionally gets short of breath with heavy exertion and has some swelling in her legs only when standing up for long periods of time. She continues to work at [employer] although there is some stress she is actually maintaining her exercise regimen. This is clearly demonstrated improvement in her risk factors including a reduction in cholesterol. A recent lipid NMR demonstrated a particle number of 631 and an LDL cholesterol of 81.   Theresa Mccarty returns for annual followup today. She reports feeling very well. She recently had the shingles vaccine at [facility] She had some questions today about starting coconut oil glucosamine/chondroitin which I think are fine. She did have one episode of discomfort in her chest after working in regard for which she took nitroglycerin however she noted no improvement in her symptoms. She is very interested in decreasing her Lipitor she does report some soreness in her arm muscles as well as her leg muscles. She believes this is related to the statin. She has not been on any other statin since her stent was placed in 2008. She is interested a recheck of her cholesterol  today as well as a screening hemoglobin A1c - she is nondiabetic.  Finally, she recently had a fall at home which was due to tripping over a table leg. She did have some soreness in her right hand as well as ecchymosis and swelling of her digits and 2 bandages on her right toes.  Theresa Mccarty has no specific complaints today. She denies any chest pain or worsening shortness of breath. Blood pressure is at goal. She continues to take aspirin and Plavix without any bleeding complications. Cholesterol is due for recheck. She does report some mild calf tenderness which she attributes to a atorvastatin.  06/13/2016  Theresa Mccarty returns today for follow-up. Over the past year she's done fairly well. She's had some struggles with arthritis. She noted that she was having some side effects related to atorvastatin including weakness in her arm which improved after discontinuing the medicine. She says she's had about 8 pound weight loss however we demonstrated only 1-2 pounds in the office. She is currently on red yeast rice and wishes to have a fasting lipid profile today. She denies any significant chest discomfort or worsening shortness of breath. She recently retired from her job and notes less stress in her life. Blood pressure is well-controlled today.  04/20/2017  Theresa Mccarty was seen today in follow-up. Recently she was seen by Kerin Ransom and had symptoms of worsening fatigue and palpitations. She was found to be in A. fib with RVR. He adjusted some medications for rate control including adding diltiazem and started her on Eliquis.  She reports she's been taking Eliquis for the past several weeks however did miss at least one dose possibly 2. Today heart rate is just below 100 and her rhythm is irregular. She reports she feels better but still is fatigued. We discussed the possibility of a cardioversion attempt. She understands she would need at least 3 weeks of uninterrupted anticoagulation or TEE guided approach in order to  do that. She feels that she could be more compliant with medication.  06/16/2017  Theresa Mccarty returns today for follow-up. She underwent cardioversion which was unsuccessful. Subsequently she said that she does not think that she was terribly symptomatic therefore we proceeded rate controlled. I increased her diltiazem and heart rate is well-controlled in the 80s today. She reports no significant fatigue. She is switched from Eliquis to Xarelto due to the fact that she had trouble remembering the second dose of Eliquis. Blood pressure was elevated somewhat initially 154/95 however came down to 138/88. She also reports some abdominal bloating but his recent started on probiotics. She gets some lower extremity swelling but has some evidence of venous insufficiency. She is on hydrochlorothiazide with Diovan.  12/04/2017  Theresa Mccarty was seen today in follow-up.  She continues to be without complaints.  She denies any chest pain or worsening shortness of breath.  She remains in persistent if not permanent atrial fibrillation.  She has been on Xarelto which is been easier for her to remember however she is concerned about the cost, particularly now that she is in the donut hole is causing her about $500 a month.  Blood pressure is pretty well controlled.  She showed me numbers at home indicating blood pressures between [id number] and [id number] systolic.  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  05/21/2018  Theresa Mccarty was seen today in follow-up.  She denies chest pain or shortness of breath.  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  She is in persistent if not permanent atrial fibrillation.  Her family recently purchased a [place] mobile watch for her to monitor her a-fib and she has the ability to send EKGs to me.  Blood pressure is reasonably well controlled today.  We recently discussed her lipid  profile which in March showed an LDL of 84 with total cholesterol 183.  She has been taking ezetimibe but is statin intolerant.  Her goal LDL is less than 70 given her history of coronary artery disease and she may be a candidate for PCSK9 inhibitor therapy.  In the past she had intolerance to atorvastatin, but did not recall she had tried rosuvastatin.  09/03/2018  Theresa Mccarty returns today for follow-up.  She is now getting up for furniture market next week.  She denies any chest pain or worsening shortness of breath.  She is noted to be in permanent A. fib with rate control.  She says she is tolerated changes in her medications including the recent addition of ezetimibe.  Recent lipid profile indicated total cholesterol 160, HDL 100 LDL 51 and triglycerides 46.  Blood pressure appears well-controlled and she is asymptomatic at this time.  08/13/2019  Theresa Mccarty seen today in follow-up.  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  She has stopped taking her statin medication.  She found that  she was having some chest discomfort that resolved.  She continues on ezetimibe.  Her labs have been well controlled but she is due for recheck lipid profile.  Blood pressure was elevated today 150/90.  She says at home is generally 110-130s.  She brought in cardia mobile strips which shows that she is in A. fib almost permanently.  PMHx:  Past Medical History:  Diagnosis Date  . Atrial fibrillation ([place])    a. initially diagnosed in 02/2017. Started on Eliquis  . Back pain, chronic   . Broken leg 1958  . CAD (coronary artery disease)    a. s/p DES to LAD in 2008, patent by cath in 2012.  [name] Hearing loss    bilateral hearing aids  . History of echocardiogram 09/11/2007   normal  . History of nuclear stress test 11/17/2010   exercise; small fixed anteroseptal defect (?artifact); short run of  atrial-tachy during recovery; low risk  . Hyperlipidemia   . Hypertension   . Hypothyroid   . Impaired fasting glucose   . Osteoporosis   . Renal cell carcinoma right; 2011   Dr. Alinda Money, [facility]  . Sleep apnea    on CPAP    Past Surgical History:  Procedure Laterality Date  . ABDOMINAL HYSTERECTOMY  1975   PARTIAL; part of 1 ovary remains  . CARDIAC CATHETERIZATION  10/08/2011   LAD-prox stent patent; no significant obstructive CAD; hi LVF end-diastolic pressure  . CARDIOVERSION N/A 05/12/2017   Procedure: CARDIOVERSION;  Surgeon: [name], MD;  Location: [facility] END[facility]COPY;  Service: Cardiovascular;  Laterality: N/A;  . CORONARY ANGIOPLASTY WITH STENT PLACEMENT  2008   mid LAD  . IR GENERIC HISTORICAL  08/18/2016   IR RADIOLOGIST EVAL & MGMT 08/18/2016 [name], MD [facility] INTERV RAD  . IR RADIOLOGIST EVAL & MGMT  08/09/2018  . KIDNEY SURGERY  2011   renal carcinoma - cryoablation -  Dr. Alinda Money and Dr. Laural Roes (cryoablation)  . SPINE SURGERY      FAMHx:  Family History  Problem Relation Age of Onset  . Heart disease Mother   . Hypertension Mother   . Arthritis Mother   . Osteoporosis Mother   . Depression Sister   . Hyperlipidemia Sister   . Heart disease Father   . Diabetes Brother   . Cancer Sister        unsure, related to smoking?  . Cancer Sister        lung cancer  . Hyperlipidemia Brother   . Hypertension Brother     SOCHx:   reports that she quit smoking about 50 years ago. She has never used smokeless tobacco. She reports current alcohol use of about 2.0 standard drinks of alcohol per week. She reports that she does not use drugs.  ALLERGIES:  Allergies  Allergen Reactions  . Atorvastatin Other (See Comments)    Muscle aches  . Celebrex [Celecoxib] Other (See Comments)    Muscle aches  . Nitrofurantoin Nausea Only and Rash    ROS: Pertinent items noted in HPI and remainder of comprehensive ROS otherwise negative.  HOME MEDS:  Current Outpatient Medications  Medication Sig Dispense Refill  . alendronate (FOSAMAX) 70 MG tablet TAKE 1 TABLET BY MOUTH EVERY 7 DAYS. TAKE WITH A FULL GLASS OF WATER ON AN EMPTY STOMACH. 12 tablet 3  . aspirin EC 81 MG tablet Take 1 tablet (81 mg total) by mouth daily. 90 tablet 3  . B Complex Vitamins (B-COMPLEX/B-12 PO) Take 1  tablet by mouth daily.      . Calcium-Magnesium-Zinc 500-250-12.5 MG TABS Take 1 tablet by mouth daily.    . Cholecalciferol (VITAMIN D) 2000 units tablet Take 2,000 Units by mouth daily.    . Coenzyme Q10 (COQ10) 100 MG CAPS Take 1 tablet by mouth daily.     [name] diltiazem (CARDIZEM CD) 240 MG 24 hr capsule TAKE ONE CAPSULE BY MOUTH EVERY DAY 90 capsule 1  . ezetimibe (ZETIA) 10 MG tablet TAKE 1 TABLET BY MOUTH EVERY DAY 90 tablet 1  . levothyroxine (SYNTHROID) 50 MCG tablet TAKE 1 TABLET BY MOUTH EVERY DAY 90 tablet 1  . MILK THISTLE PO Take 1 capsule by mouth daily.    . nitroGLYCERIN (NITROSTAT) 0.4 MG SL tablet PLACE 1 TABLET (0.4 MG TOTAL) UNDER THE TONGUE EVERY 5 (FIVE) MINUTES AS NEEDED FOR CHEST PAIN. 25 tablet 4  . Polyvinyl Alcohol-Povidone (REFRESH OP) Place 1 drop into both eyes daily.    . Probiotic Product (PROBIOTIC PO) Take 1 capsule by mouth daily.    . valsartan-hydrochlorothiazide (DIOVAN-HCT) 320-25 MG tablet TAKE 1 TABLET BY MOUTH DAILY. 90 tablet 2  . XARELTO 20 MG TABS tablet TAKE 1 TABLET BY MOUTH DAILY WITH SUPPER. 90 tablet 1   No current facility-administered medications for this visit.     LABS/IMAGING: No results found for this or any previous visit (from the past 48 hour(s)). No results found.  VITALS: BP (!) 150/90 Comment: unable to obtain her bp  Pulse 90   Ht 5' 2.5" (1.588 m)   Wt 163 lb 9.6 oz (74.2 kg)   SpO2 95%   BMI 29.45 kg/m   EXAM: General appearance: alert and no distress Neck: no carotid bruit and no JVD Lungs: clear to auscultation bilaterally Heart: irregularly irregular rhythm Abdomen: soft, non-tender;  bowel sounds normal; no masses,  no organomegaly Extremities: extremities normal, atraumatic, no cyanosis or edema Pulses: 2+ and symmetric Skin: Skin color, texture, turgor normal. No rashes or lesions Neurologic: Grossly normal Psych: Pleasant  EKG: A. fib at 90-personally reviewed  ASSESSMENT: 1. Persistent atrial fibrillation with controlled ventricular response - failed cardioversion 2. CHADSVASC score of 4 on Xarelto 3. Coronary artery disease status post PCI of the proximal LAD in 2008 4. Hypertension - controlled 5. Hyperlipidemia -not at goal LDL less than 70 6. Statin intolerance 7. Obstructive sleep apnea on CPAP - 100% compliant 8. Arthritis  PLAN: 1.   Mrs. Boehnlein overall seems to be doing well.  She describes some atypical chest discomfort when exerting herself on the treadmill.  She says it feels like it is in the chest wall anteriorly.  She does not wear a sports bra.  She also supports herself on the treadmill because she gets dizzy and may be supporting a lot of her upper body weight.  It does not sound like angina.  In fact her exercise is improved and she can now go 60 minutes at a time rather than 30 minutes.  She will need to follow her blood pressures at home as they are elevated today.  We may need to adjust her medications.  We will recheck her cholesterol and thyroid today.  We also discussed some of her alcohol use.  She is now doing about 3 to 4 glasses of wine a day.  This is excessive in my opinion and I advised her to see if she could back off on that some which should help with her blood pressure and weight as  well. She is 100% compliant with CPAP, though, and it seems to be benefiting her.  Follow-up 6 months.  [name], MD, [facility], [place] Director of the Advanced Lipid Disorders &  Cardiovascular [facility] Reduction [facility] Attending Cardiologist  Direct Dial: [phone]  Fax: [phone]  Website:   [email or web address] 08/13/2019, 8:55 AM

## 2019-08-13 NOTE — Patient Instructions (Signed)
Medication Instructions:  Your physician recommends that you continue on your current medications as directed. Please refer to the Current Medication list given to you today.  If you need a refill on your cardiac medications before your next appointment, please call your pharmacy.   Lab work: FASTING lab work to check cholesterol & thyroid  If you have labs (blood work) drawn today and your tests are completely normal, you will receive your results only by: [name] MyChart Message (if you have MyChart) OR . A paper copy in the mail If you have any lab test that is abnormal or we need to change your treatment, we will call you to review the results.  Testing/Procedures: NONE  Follow-Up: At [facility], you and your health needs are our priority.  As part of our continuing mission to provide you with exceptional heart care, we have created designated Provider Care Teams.  These Care Teams include your primary Cardiologist (physician) and Advanced Practice Providers (APPs -  Physician Assistants and Nurse Practitioners) who all work together to provide you with the care you need, when you need it. You will need a follow up appointment in 6 months.  Please call our office 2 months in advance to schedule this appointment.  You may see [name], MD or one of the following Advanced Practice Providers on your designated Care Team: [place], [place] . Fabian Sharp, PA-C  Any Other Special Instructions Will Be Listed Below (If Applicable).  Please monitor your blood pressure @ home 1-2 times daily for about 1 week. Please call our office with the readings or send to [name] via [place].  HOW TO TAKE YOUR BLOOD PRESSURE:  Rest 5 minutes before taking your blood pressure.  Don't smoke or drink caffeinated beverages for at least 30 minutes before.  Take your blood pressure before (not after) you eat.  Sit comfortably with your back supported and both feet on the floor (don't cross your  legs).  Elevate your arm to heart level on a table or a desk.  Use the proper sized cuff. It should fit smoothly and snugly around your bare upper arm. There should be enough room to slip a fingertip under the cuff. The bottom edge of the cuff should be 1 inch above the crease of the elbow.  Ideally, take 3 measurements at one sitting and record the average.

## 2019-08-16 ENCOUNTER — Other Ambulatory Visit: Payer: Self-pay

## 2019-08-16 ENCOUNTER — Ambulatory Visit (HOSPITAL_COMMUNITY)
Admission: RE | Admit: 2019-08-16 | Discharge: 2019-08-16 | Disposition: A | Discharge status: Home / Self Care | Payer: Medicare Other | Source: Ambulatory Visit | Attending: Urology | Admitting: Urology

## 2019-08-16 ENCOUNTER — Other Ambulatory Visit (HOSPITAL_COMMUNITY): Payer: Self-pay | Admitting: Urology

## 2019-08-16 DIAGNOSIS — C641 Malignant neoplasm of right kidney, except renal pelvis: Secondary | ICD-10-CM | POA: Diagnosis not present

## 2019-08-16 DIAGNOSIS — Z85528 Personal history of other malignant neoplasm of kidney: Secondary | ICD-10-CM | POA: Diagnosis not present

## 2019-08-20 HISTORY — PX: CATARACT EXTRACTION, BILATERAL: SHX1313

## 2019-08-21 DIAGNOSIS — H2512 Age-related nuclear cataract, left eye: Secondary | ICD-10-CM | POA: Diagnosis not present

## 2019-08-21 DIAGNOSIS — H2511 Age-related nuclear cataract, right eye: Secondary | ICD-10-CM | POA: Diagnosis not present

## 2019-08-23 DIAGNOSIS — C641 Malignant neoplasm of right kidney, except renal pelvis: Secondary | ICD-10-CM | POA: Diagnosis not present

## 2019-08-25 ENCOUNTER — Other Ambulatory Visit: Payer: Self-pay | Admitting: Internal Medicine

## 2019-09-04 DIAGNOSIS — H2512 Age-related nuclear cataract, left eye: Secondary | ICD-10-CM | POA: Diagnosis not present

## 2019-09-08 IMAGING — MR MR 3D RECON AT SCANNER
9 of 22 series · 19 of 48 positions shown · IV contrast (multihance)
Comparison: CT abdomen from 07/28/2015 an MRI from 04/29/2014

CLINICAL DATA: Evaluate pancreatic cyst and kidney lesions.

EXAM:
MRI ABDOMEN WITHOUT AND WITH CONTRAST
TECHNIQUE: Multiplanar multisequence MR imaging of the abdomen was performed
both before and after the administration of intravenous contrast.
CONTRAST:  20mL MULTIHANCE GADOBENATE DIMEGLUMINE 529 MG/ML IV SOLN

[Series 3: DWI b500 · axial · 6.0mm · 1.64mm/px · z∈[-133,+117]mm · 3 of 66 slices shown]
[im 1/66]
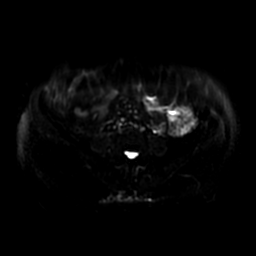
[im 33/66]
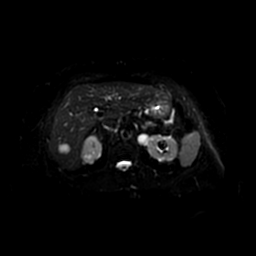
[im 66/66]
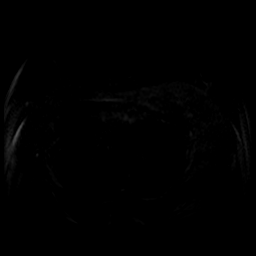

[Series 4: T2 fat-sat · axial · 5.0mm · 0.86mm/px · z∈[-177,+108]mm · 3 of 58 slices shown]
[im 1/58]
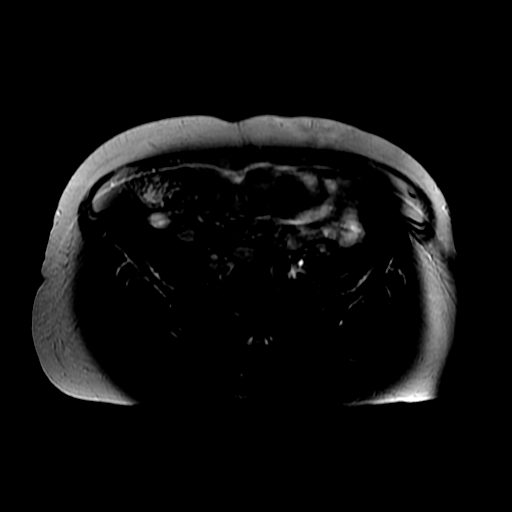
[im 29/58]
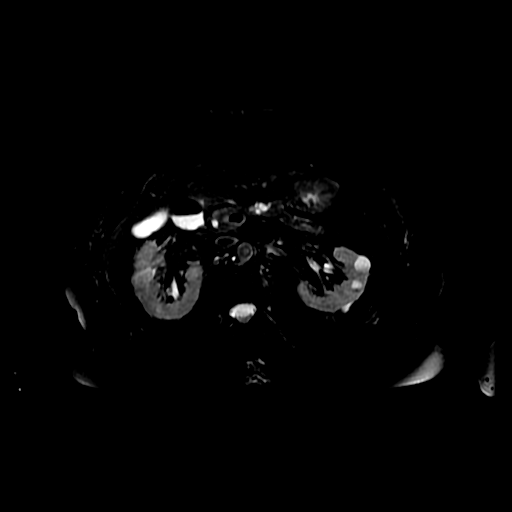
[im 58/58]
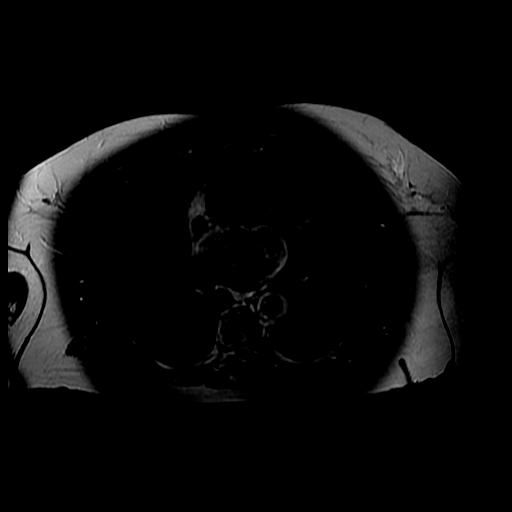

[Series 5: T2 · axial · 5.0mm · 0.86mm/px · z∈[-177,+108]mm · 3 of 58 slices shown (1 of 2)]
[im 1/58]
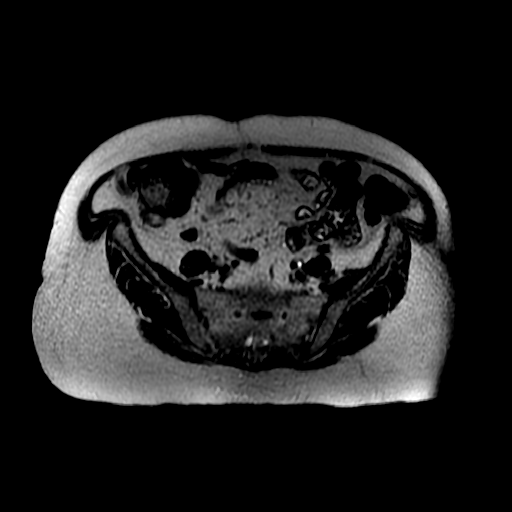
[im 29/58]
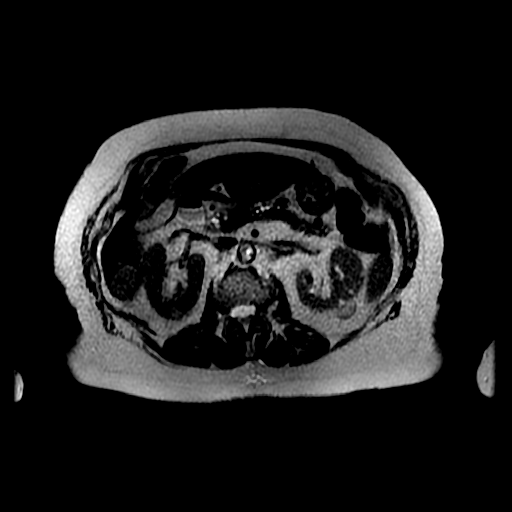
[im 58/58]
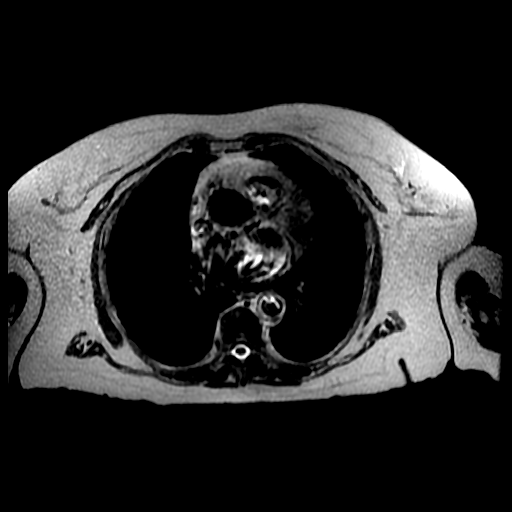

[Series 6: T2 · coronal · 5.0mm · 0.78mm/px · 1 of 47 slices shown (2 of 2)]
[im 1/47]
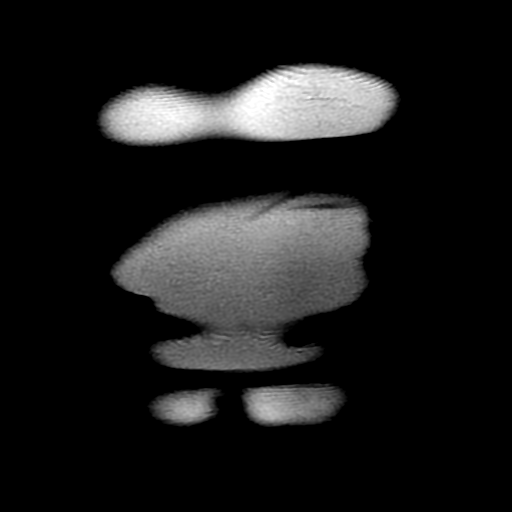

[Series 8: bSSFP · coronal · 5.0mm · 0.78mm/px · 1 of 47 slices shown (1 of 2)]
[im 1/47]
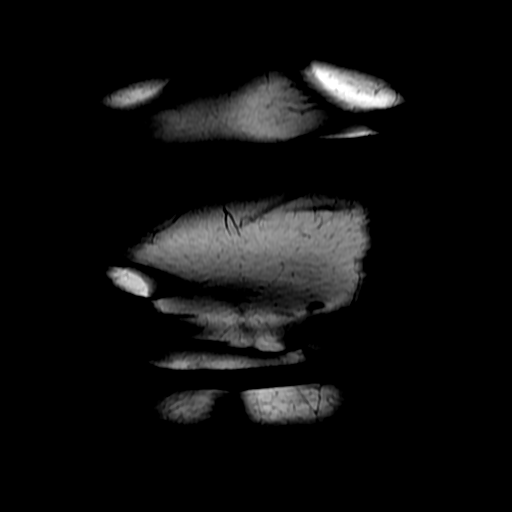

[Series 9: bSSFP · axial · 5.0mm · 0.86mm/px · z∈[-177,+108]mm · 2 of 58 slices shown (2 of 2)]
[im 1/58]
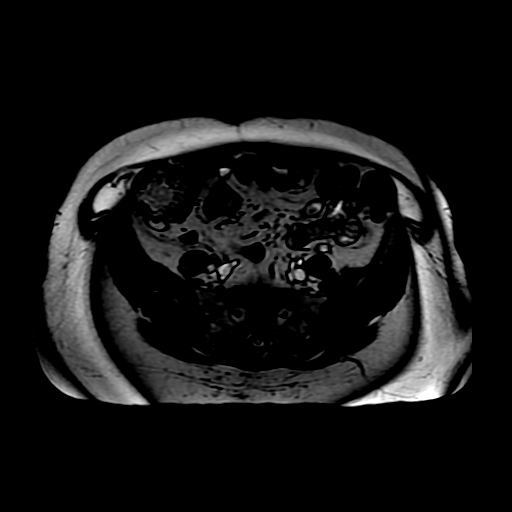
[im 58/58]
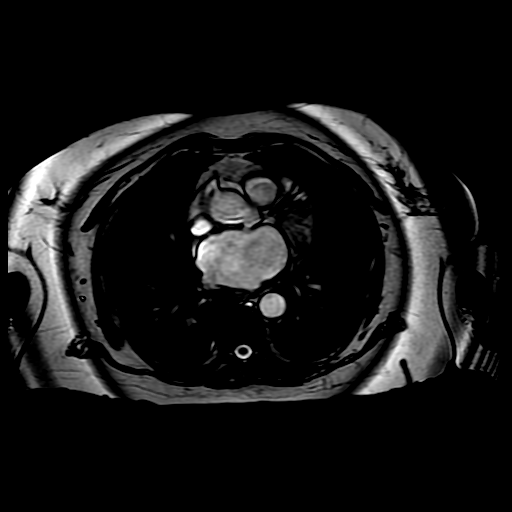

[Series 10: ax dualecho · axial · 5.0mm · 0.86mm/px · z∈[-177,+108]mm · 3 of 116 slices shown]
[im 1/116]
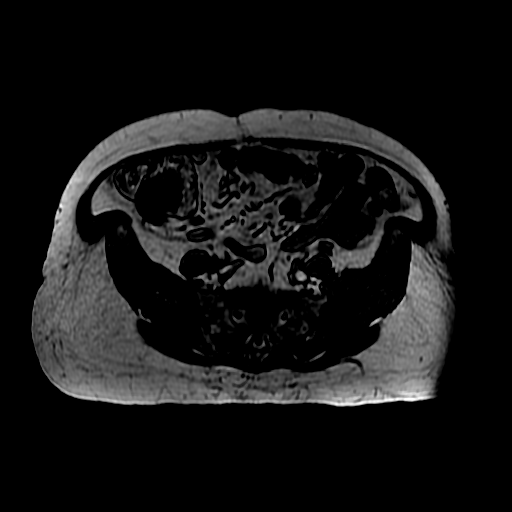
[im 58/116]
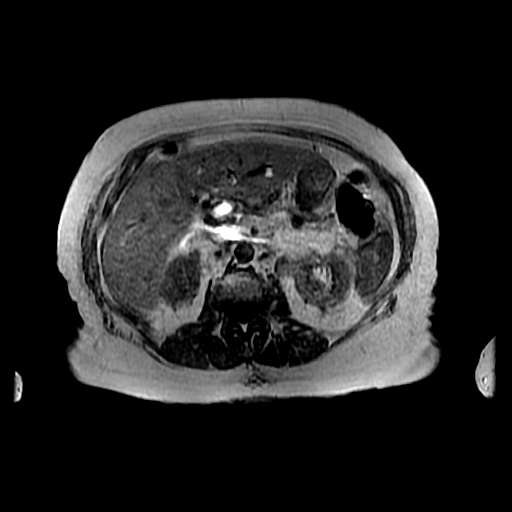
[im 116/116]
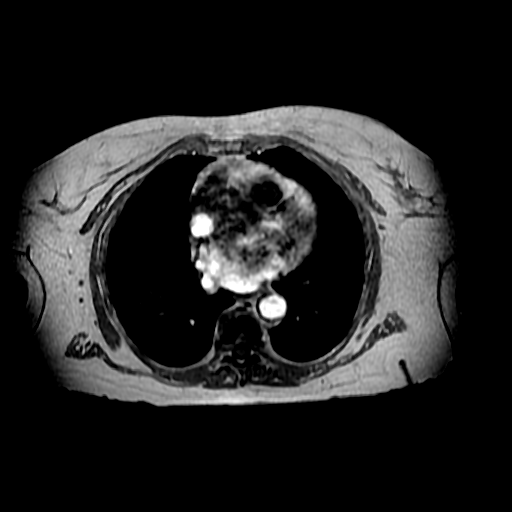

[Series 300: DWI · axial · 6.0mm · 1.64mm/px · 1 of 33 slices shown]
[im 1/33]
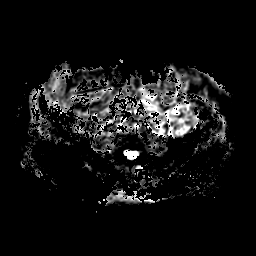

[Series 700: reformatted · axial · 1.6mm · 0.62mm/px · z∈[-22,+108]mm · 2 of 112 slices shown]
[im 1/112]
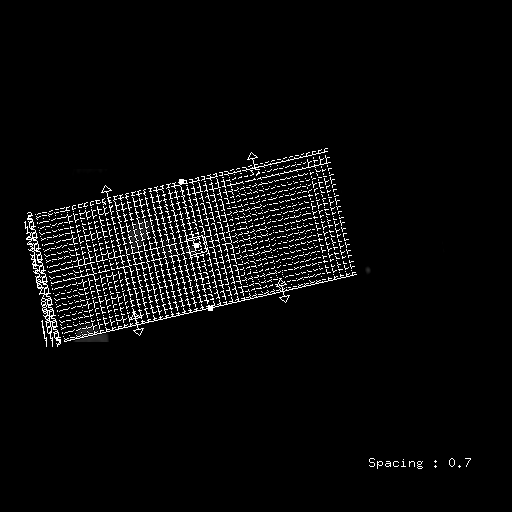
[im 56/112]
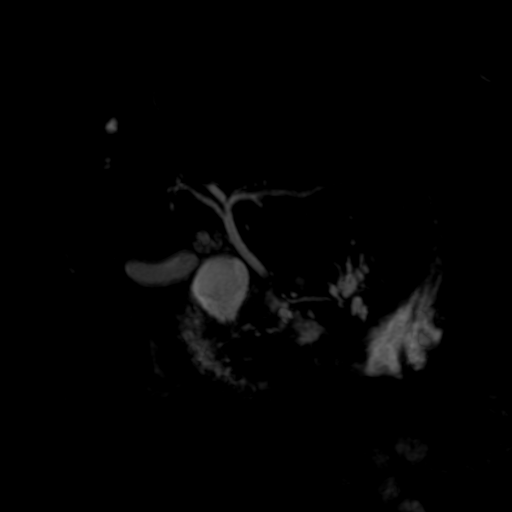

[19 of 48 positions shown; findings below may reference images not displayed]

FINDINGS: Lower chest: No acute findings.

Hepatobiliary: Diffuse hepatic steatosis. Multiple liver cysts are
again identified. The largest is in the posterior right hepatic lobe
measuring 3.3 cm. No suspicious liver abnormality identified.
Gallbladder appears normal. No significant biliary dilatation.

Pancreas: No pancreatic inflammation or main duct dilatation
identified. There are numerous T2 hyperintense cystic lesions
identified throughout the pancreas. The index cystic lesion within
the tail of pancreas measures 1.1 cm, image [DATE]. Previously 1.6 cm.
The index cyst within the body of pancreas measures 6 mm and is
unchanged, image 32/5. New cyst within head of pancreas measures
cm, image [DATE]. cyst within uncinate process of pancreas measures 1
cm, image [DATE]. Previously this measured the same. No solid
enhancing pancreatic lesion identified.

Spleen:  Within normal limits in size and appearance.

Adrenals/Urinary Tract:  Normal appearance of the adrenal glands.

Evaluation of the kidneys is somewhat limited due to respiratory
there are several hemorrhagic cysts identified motion artifact. No
hydronephrosis identified bilaterally. The cryo ablation defect
involving the medial cortex of the upper pole of the right kidney
appears stable. No specific findings identified to suggest
residual/recurrence of tumor.

Bilateral kidney cysts are identified of varying complexity.
Assessment of internal enhancement characteristics of these lesions
is diminished secondary to respiratory motion artifact.

Within the posterior cortex of the upper pole of right kidney there
is a septated lesion measuring 1.6 cm, image [DATE]. And image 33/8.
There is equivocal enhancement within this structure versus volume
averaging with adjacent medullary pyramid, image 57/8230. On the
previous exam this structure measured 8 mm. Additional small cysts
within the inferior pole the right kidney are too small to reliably
characterize.

Several left kidney cysts are complicated by hemorrhage. The
previously referenced index lesion arising from the anterolateral
cortex of the interpolar left kidney with equivocal enhancement
currently measures 1.2 cm, image [DATE]. Previously this measured
cm. No definite internal enhancement identified within this
structure. Additional lesions include a 2.2 cm hemorrhagic lesion
arising from the medial cortex of the upper pole of left kidney,
image [DATE], previously 1.5 cm. No definite enhancement identified
within this structure. Posterior cortex of left upper pole kidney
hemorrhagic lesion measuring 1.9 cm is unchanged, image [DATE].
Hemorrhagic lesion arising from the posterolateral cortex of the
lower pole left kidney is stable in size measuring 1.9 cm, image
35/4. Also no definite internal enhancement.

Stomach/Bowel: Visualized portions within the abdomen are
unremarkable.

Vascular/Lymphatic: No pathologically enlarged lymph nodes
identified. No abdominal aortic aneurysm demonstrated.

Other:  None.

Musculoskeletal: No suspicious bone lesions identified.
IMPRESSION: 1. Exam detail is diminished secondary to respiratory motion
artifact
2. Stable postprocedure changes of prior cryoablation involving the
medial aspect of the upper pole of right kidney. No findings to
suggest local recurrence of disease.
3. Multiple kidney are identified in both kidneys. The respiratory
motion makes evaluation of these lesions specially difficult. Within
the upper pole of the right kidney there is a septated cyst which
appears increased in size from previous exam which exhibits
equivocal internal enhancement versus volume averaging with adjacent
medullary pyramid. Advise close interval follow-up of this lesion.
There are several lesions in the left kidney which are complicated
by hemorrhage. No definitive enhancement identified within these
structures.
4. Again noted are numerous cysts scattered throughout the pancreas.
These have a benign nonaggressive appearance. One in the head of
pancreas appears new. Based on the size of the cysts and age of
patient suggest follow-up imaging in 2 years with repeat contrast
enhanced MRI of the abdomen. This recommendation follows ACR
consensus guidelines: Management of Incidental Pancreatic Cysts: A
White Paper of the ACR Incidental Findings Committee. [HOSPITAL] 7551;[DATE].
5. Hepatic steatosis
6. Liver cysts.

## 2019-09-09 IMAGING — CR DG CHEST 2V
2 series · 2 of 2 positions shown · non-contrast
Comparison: Radiographs August 04, 2017.

CLINICAL DATA: Right renal cell carcinoma.

EXAM:
CHEST - 2 VIEW

[w chest pa]
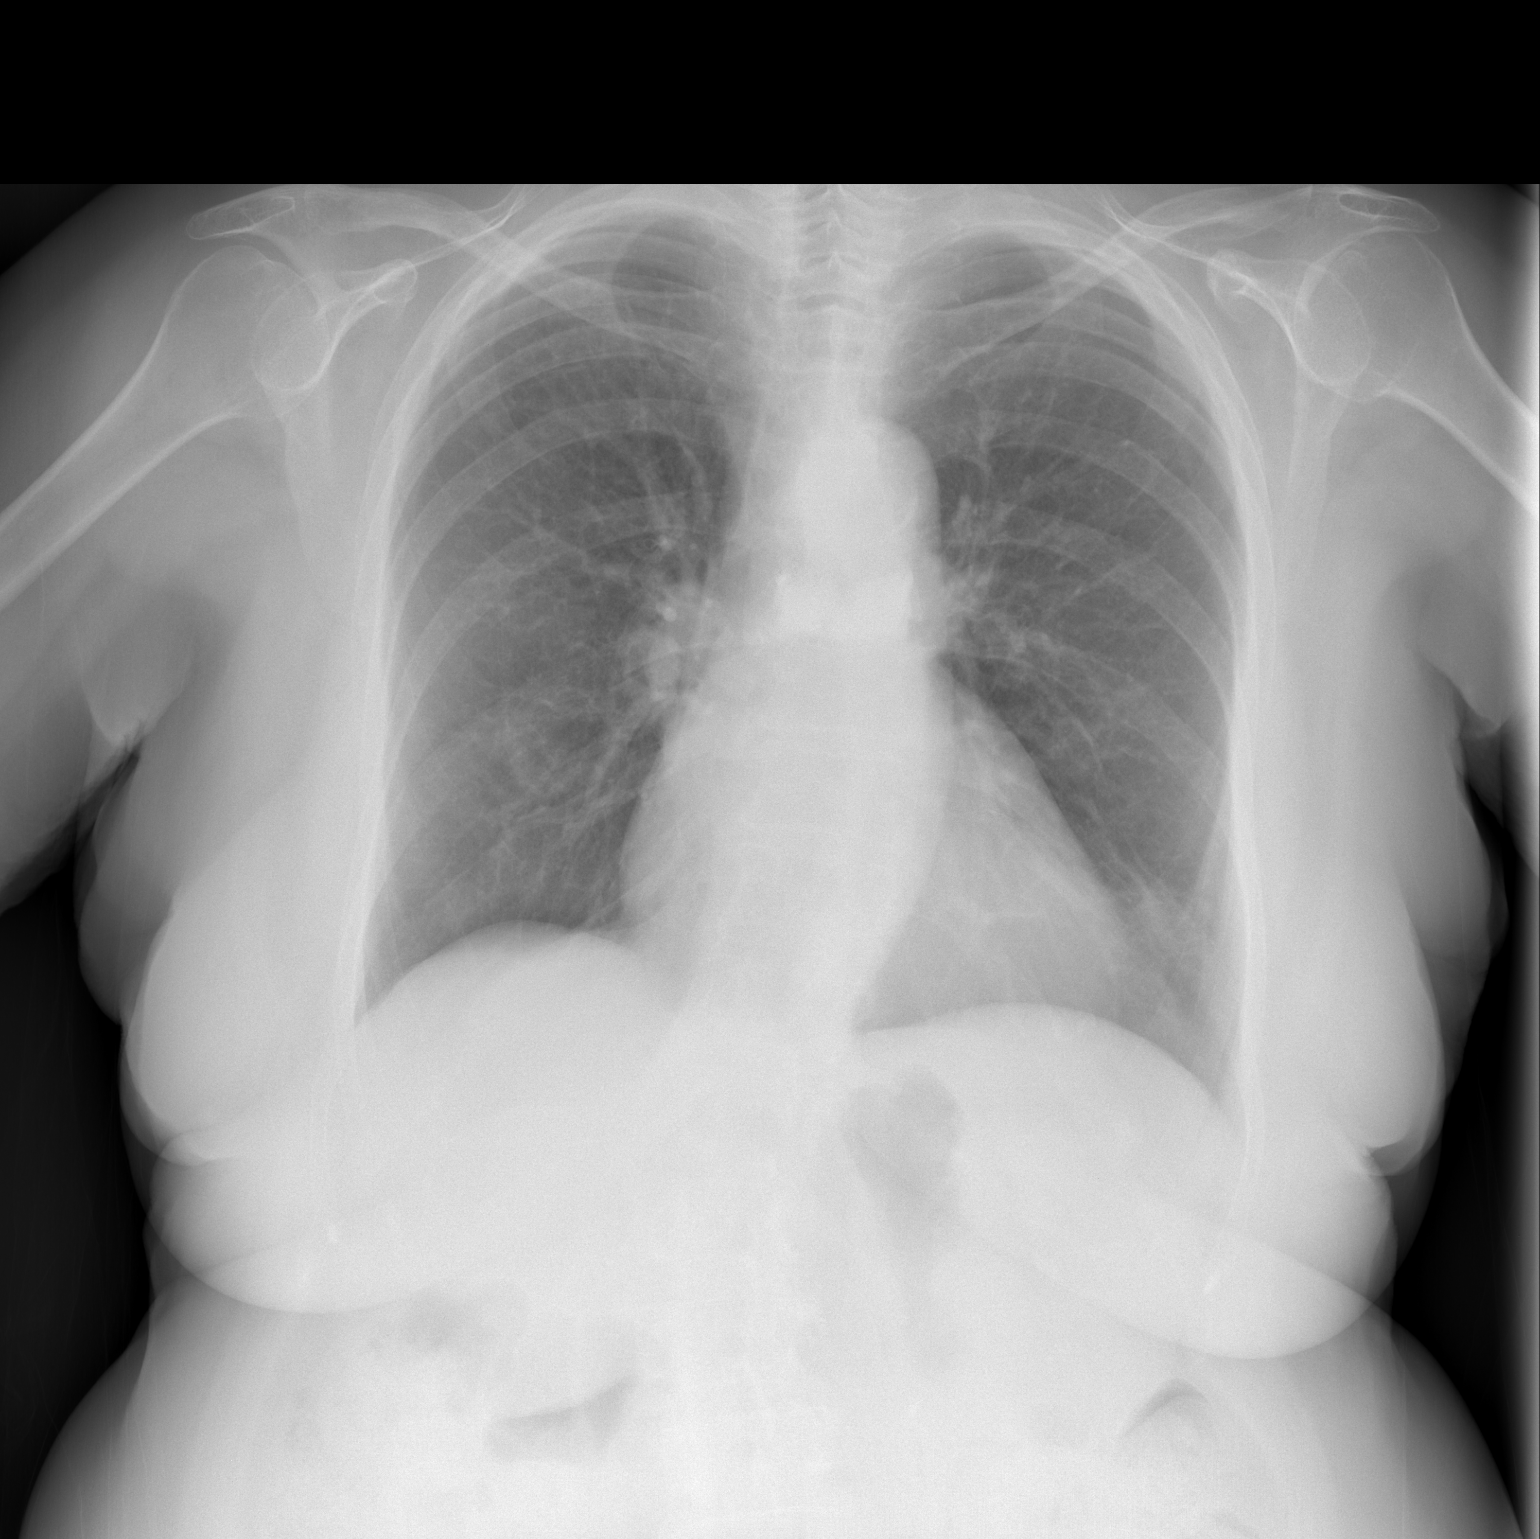

[w chest lat]
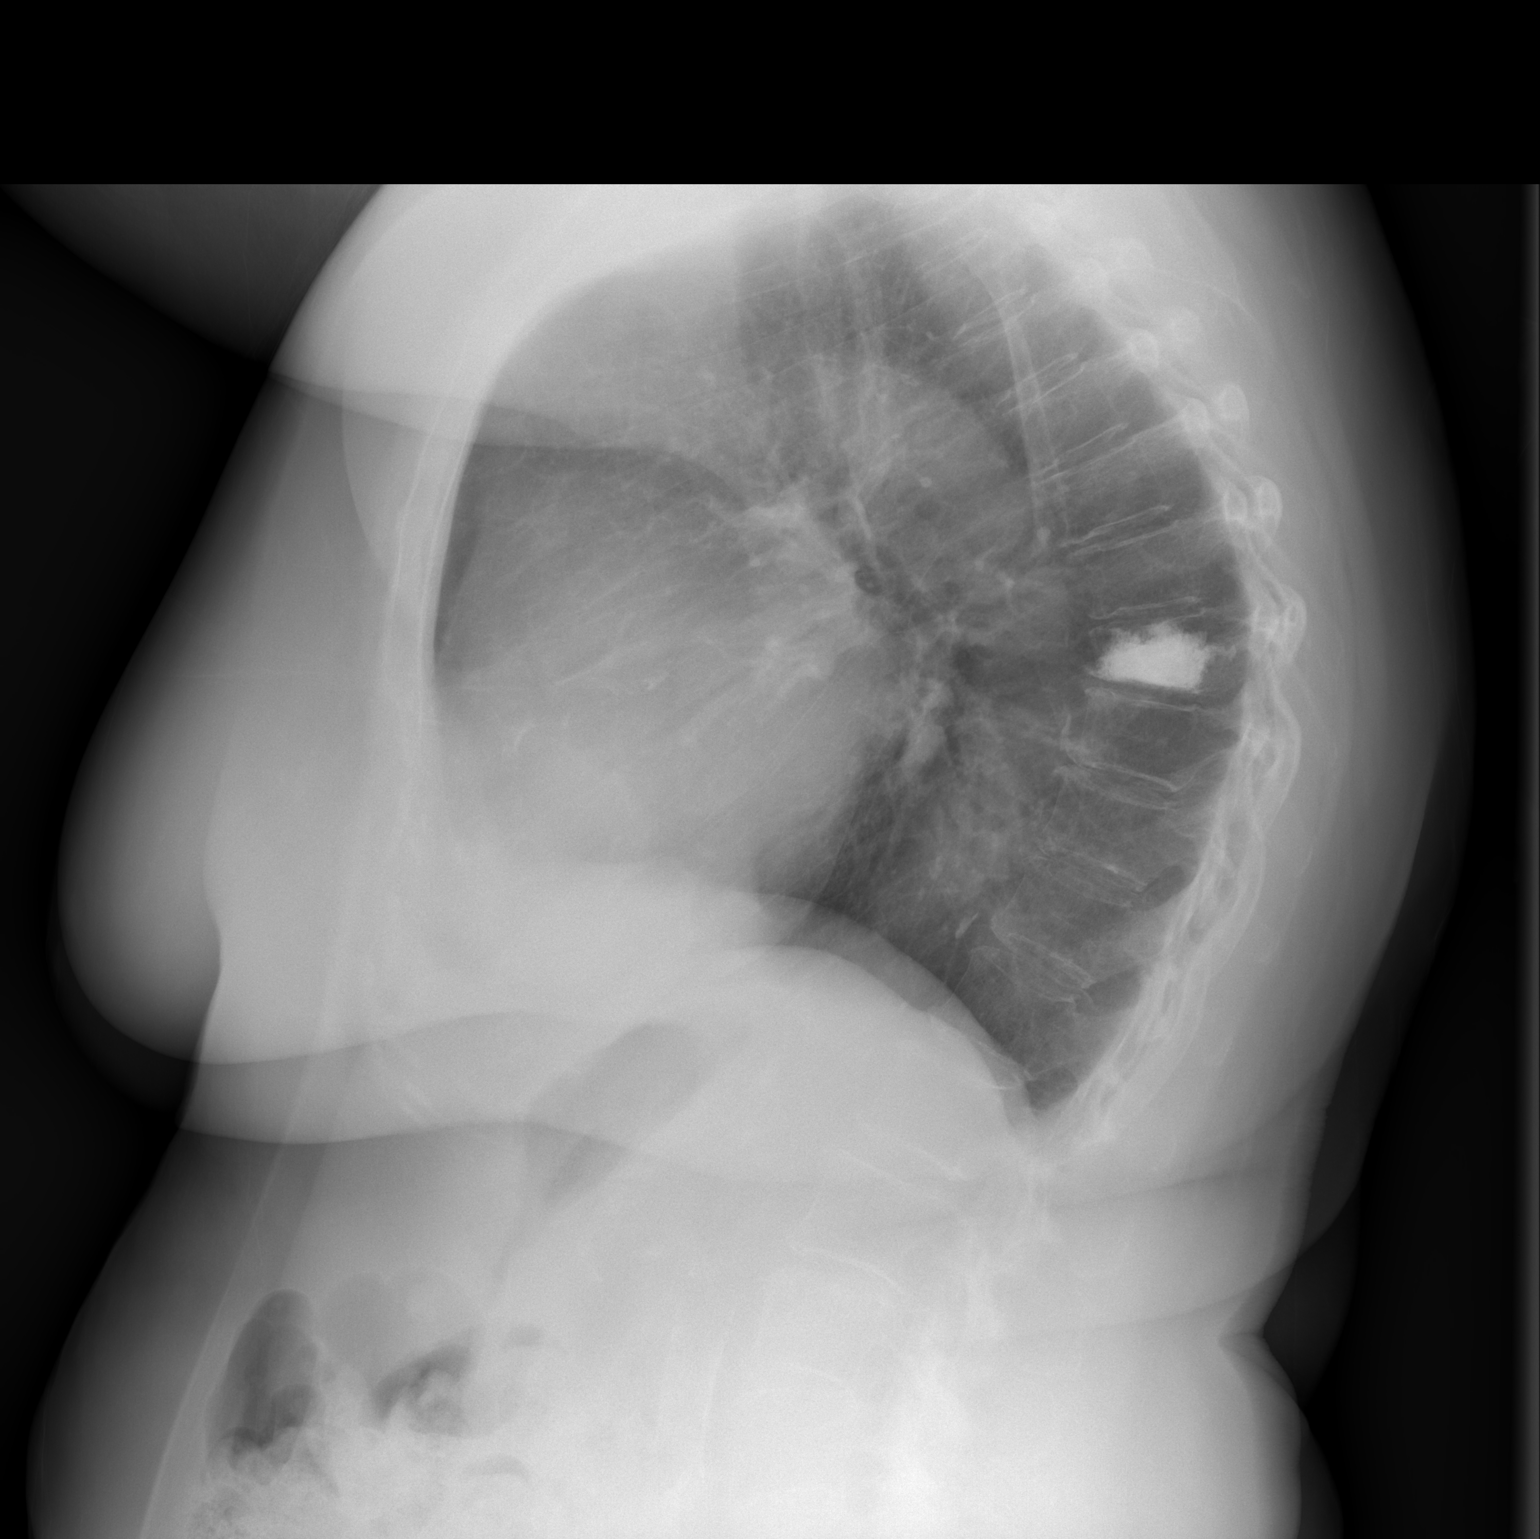

[2 of 2 positions shown; findings below may reference images not displayed]

FINDINGS: The heart size and mediastinal contours are within normal limits.
Both lungs are clear. No pneumothorax or pleural effusion is noted.
Status post kyphoplasty of midthoracic vertebral body.
IMPRESSION: No active cardiopulmonary disease.

## 2019-09-13 ENCOUNTER — Other Ambulatory Visit (HOSPITAL_COMMUNITY): Payer: Self-pay | Admitting: Interventional Radiology

## 2019-09-13 DIAGNOSIS — C641 Malignant neoplasm of right kidney, except renal pelvis: Secondary | ICD-10-CM

## 2019-09-13 DIAGNOSIS — K862 Cyst of pancreas: Secondary | ICD-10-CM

## 2019-09-16 ENCOUNTER — Other Ambulatory Visit (INDEPENDENT_AMBULATORY_CARE_PROVIDER_SITE_OTHER): Payer: Medicare Other

## 2019-09-16 ENCOUNTER — Other Ambulatory Visit: Payer: Self-pay

## 2019-09-16 DIAGNOSIS — Z23 Encounter for immunization: Secondary | ICD-10-CM

## 2019-09-17 ENCOUNTER — Ambulatory Visit (HOSPITAL_COMMUNITY)
Admission: RE | Admit: 2019-09-17 | Discharge: 2019-09-17 | Disposition: A | Discharge status: Home / Self Care | Payer: Medicare Other | Source: Ambulatory Visit | Attending: Interventional Radiology | Admitting: Interventional Radiology

## 2019-09-17 ENCOUNTER — Other Ambulatory Visit (HOSPITAL_COMMUNITY): Payer: Self-pay | Admitting: Interventional Radiology

## 2019-09-17 DIAGNOSIS — K862 Cyst of pancreas: Secondary | ICD-10-CM | POA: Diagnosis not present

## 2019-09-17 DIAGNOSIS — C641 Malignant neoplasm of right kidney, except renal pelvis: Secondary | ICD-10-CM

## 2019-09-17 DIAGNOSIS — N281 Cyst of kidney, acquired: Secondary | ICD-10-CM | POA: Diagnosis not present

## 2019-09-17 LAB — POCT I-STAT CREATININE: Creatinine, Ser: 0.8 mg/dL (ref 0.44–1.00)

## 2019-09-17 MED ORDER — GADOBUTROL 1 MMOL/ML IV SOLN
7.0000 mL | Freq: Once | INTRAVENOUS | Status: AC | PRN
  Administered 2019-09-17: 7 mL via INTRAVENOUS

## 2019-09-19 ENCOUNTER — Telehealth: Payer: Self-pay | Admitting: *Deleted

## 2019-09-19 ENCOUNTER — Other Ambulatory Visit: Payer: Self-pay

## 2019-09-19 ENCOUNTER — Ambulatory Visit
Admission: RE | Admit: 2019-09-19 | Discharge: 2019-09-19 | Disposition: A | Discharge status: Home / Self Care | Payer: Medicare Other | Source: Ambulatory Visit | Attending: Interventional Radiology | Admitting: Interventional Radiology

## 2019-09-19 ENCOUNTER — Encounter: Payer: Self-pay | Admitting: *Deleted

## 2019-09-19 DIAGNOSIS — K862 Cyst of pancreas: Secondary | ICD-10-CM

## 2019-09-19 DIAGNOSIS — C641 Malignant neoplasm of right kidney, except renal pelvis: Secondary | ICD-10-CM

## 2019-09-19 HISTORY — PX: IR RADIOLOGIST EVAL & MGMT: IMG5224

## 2019-09-19 NOTE — Progress Notes (Signed)
Chief Complaint: Patient was consulted remotely today (TeleHealth) for follow-up of renal cyst and post renal ablation.  History of Present Illness: Theresa Mccarty is a 78 y.o. female status post cryoablation of a right renal mass in 2011 without recurrence.  During surveillance after ablation, she had imaging evidence of some pancreatic cystic lesions that have been followed by MRI and also on the last MRI in 2019 a small septated cystic lesion of the upper right kidney that may have demonstrated slight enhancement after contrast administration.  Theresa Mccarty states that she is not having any symptoms currently.  Today is her 78th birthday. She is trying to lose weight and has been walking on a treadmill for 45 minutes to 1 hour daily.  She states that she is trying to decrease her alcohol intake, which has increased recently during the pandemic.  Past Medical History:  Diagnosis Date  . Atrial fibrillation ([place])    a. initially diagnosed in 02/2017. Started on Eliquis  . Back pain, chronic   . Broken leg 1958  . CAD (coronary artery disease)    a. s/p DES to LAD in 2008, patent by cath in 2012.  [name] Hearing loss    bilateral hearing aids  . History of echocardiogram 09/11/2007   normal  . History of nuclear stress test 11/17/2010   exercise; small fixed anteroseptal defect (?artifact); short run of atrial-tachy during recovery; low risk  . Hyperlipidemia   . Hypertension   . Hypothyroid   . Impaired fasting glucose   . Osteoporosis   . Renal cell carcinoma right; 2011   Dr. Alinda Money, [facility]  . Sleep apnea    on CPAP    Past Surgical History:  Procedure Laterality Date  . ABDOMINAL HYSTERECTOMY  1975   PARTIAL; part of 1 ovary remains  . CARDIAC CATHETERIZATION  10/08/2011   LAD-prox stent patent; no significant obstructive CAD; hi LVF end-diastolic pressure  . CARDIOVERSION N/A 05/12/2017   Procedure: CARDIOVERSION;  Surgeon: [name], MD;  Location: [facility]  ENDOSCOPY;  Service: Cardiovascular;  Laterality: N/A;  . CORONARY ANGIOPLASTY WITH STENT PLACEMENT  2008   mid LAD  . IR GENERIC HISTORICAL  08/18/2016   IR RADIOLOGIST EVAL & MGMT 08/18/2016 [name], MD [facility] INTERV RAD  . IR RADIOLOGIST EVAL & MGMT  08/09/2018  . IR RADIOLOGIST EVAL & MGMT  09/19/2019  . KIDNEY SURGERY  2011   renal carcinoma - cryoablation -  Dr. Alinda Money and Dr. Laural Roes (cryoablation)  . SPINE SURGERY      Allergies: Atorvastatin, Celebrex [celecoxib], and Nitrofurantoin  Medications: Prior to Admission medications   Medication Sig Start Date End Date Taking? Authorizing Provider  alendronate (FOSAMAX) 70 MG tablet TAKE 1 TABLET BY MOUTH EVERY 7 DAYS. TAKE WITH A FULL GLASS OF WATER ON AN EMPTY STOMACH. 12/31/18   [name], MD  aspirin EC 81 MG tablet Take 1 tablet (81 mg total) by mouth daily. 04/20/17   Hilty, Nadean Corwin, MD  B Complex Vitamins (B-COMPLEX/B-12 PO) Take 1 tablet by mouth daily.      [provider]  Calcium-Magnesium-Zinc 500-250-12.5 MG TABS Take 1 tablet by mouth daily.    [provider]  Cholecalciferol (VITAMIN D) 2000 units tablet Take 2,000 Units by mouth daily.    [provider]  Coenzyme Q10 (COQ10) 100 MG CAPS Take 1 tablet by mouth daily.     [provider]  diltiazem (CARDIZEM CD) 240 MG 24 hr  capsule TAKE ONE CAPSULE BY MOUTH EVERY DAY 03/21/19   Hilty, Nadean Corwin, MD  ezetimibe (ZETIA) 10 MG tablet TAKE 1 TABLET BY MOUTH EVERY DAY 06/10/19   Hilty, Nadean Corwin, MD  levothyroxine (SYNTHROID) 50 MCG tablet TAKE 1 TABLET BY MOUTH EVERY DAY 07/16/19   [name], MD  MILK THISTLE PO Take 1 capsule by mouth daily.    [provider]  nitroGLYCERIN (NITROSTAT) 0.4 MG SL tablet PLACE 1 TABLET (0.4 MG TOTAL) UNDER THE TONGUE EVERY 5 (FIVE) MINUTES AS NEEDED FOR CHEST PAIN. 01/02/19   Hilty, Nadean Corwin, MD  Polyvinyl Alcohol-Povidone (REFRESH OP) Place 1 drop into both eyes daily.    [provider]  Probiotic Product (PROBIOTIC PO) Take 1 capsule by mouth daily.    [provider]  valsartan-hydrochlorothiazide (DIOVAN-HCT) 320-25 MG tablet TAKE 1 TABLET BY MOUTH EVERY DAY 08/28/19   Hilty, Nadean Corwin, MD  XARELTO 20 MG TABS tablet TAKE 1 TABLET BY MOUTH DAILY WITH SUPPER. 04/24/19   Hilty, Nadean Corwin, MD     Family History  Problem Relation Age of Onset  . Heart disease Mother   . Hypertension Mother   . Arthritis Mother   . Osteoporosis Mother   . Depression Sister   . Hyperlipidemia Sister   . Heart disease Father   . Diabetes Brother   . Cancer Sister        unsure, related to smoking?  . Cancer Sister        lung cancer  . Hyperlipidemia Brother   . Hypertension Brother     Social History   Socioeconomic History  . Marital status: Married    Spouse name: Not on file  . Number of children: 2  . Years of education: Not on file  . Highest education level: Not on file  Occupational History  . Occupation: retired Teacher, music: [employer]  Social Needs  . Financial resource strain: Not on file  . Food insecurity    Worry: Not on file    Inability: Not on file  . Transportation needs    Medical: Not on file    Non-medical: Not on file  Tobacco Use  . Smoking status: Former Smoker    Quit date: 12/19/1968    Years since quitting: 50.7  . Smokeless tobacco: Never Used  Substance and Sexual Activity  . Alcohol use: Yes    Alcohol/week: 2.0 standard drinks    Types: 2 Glasses of wine per week    Comment: 2-3 glasses of wine per day.   . Drug use: No  . Sexual activity: Not Currently  Lifestyle  . Physical activity    Days per week: Not on file    Minutes per session: Not on file  . Stress: Not on file  Relationships  . Social Herbalist on phone: Not on file    Gets together: Not on file    Attends religious service: Not on file    Active member of club or organization: Not on file    Attends meetings of clubs or  organizations: Not on file    Relationship status: Not on file  Other Topics Concern  . Not on file  Social History Narrative   Lives with husband.  2 sons live near [place]. Retired from working for [employer] 02/2016. 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   HENT: Negative.   Respiratory: Negative.   Cardiovascular: Negative.   Gastrointestinal: Negative.   Genitourinary: Negative.   Musculoskeletal: Negative.   Neurological: Negative.     Review of Systems: A 12 point ROS discussed and pertinent positives are indicated in the HPI above.  All other systems are negative.  Physical Exam No direct physical exam was performed (except for noted visual exam findings with Video Visits).   Vital Signs: There were no vitals taken for this visit.  Imaging: Mr Abdomen W Wo Contrast  Result Date: 09/17/2019 CLINICAL DATA:  Pancreatic cysts and history of renal cell carcinoma. EXAM: MRI ABDOMEN WITHOUT AND WITH CONTRAST (INCLUDING MRCP) TECHNIQUE: Multiplanar multisequence MR imaging of the abdomen was performed both before and after the administration of intravenous contrast. Heavily T2-weighted images of the biliary and pancreatic ducts were obtained, and three-dimensional MRCP images were rendered by post processing. CONTRAST:  18mL GADAVIST GADOBUTROL 1 MMOL/ML IV SOLN COMPARISON:  08/09/2018 and multiple prior studies. FINDINGS: Lower chest: No signs of effusion. No signs of effusion new finding. Signs of vertebroplasty in vertebral hemangioma incidentally noted. Hepatobiliary: Hepatic cysts largest in the right hepatic lobe, unchanged from previous exams. Pancreas: Pancreatic cysts and mild ductal dilation unchanged from most recent comparison respiratory triggered high-resolution MRCP images are utilized for comparison *Dominant cluster of cystic  foci superior to the pancreatic duct on coronal images measures 22 mm in greatest coronal dimension and is within 1-2 mm on prior exam but may be enlarged since 2017. Also on axial images, reconstructing the thin-section data maximum axial dimension approximately 17 mm as compared to 14 mm. All of these cystic lesions appear to show ductal dilation of side branch ducts and there is also mild prominence without frank dilation of main pancreatic duct. *Small lesion in the uncinate process measures approximately 10 mm. Similar size lesion in the neck of the pancreas also without change. *Cystic lesion in the tail of the pancreas at 12 mm in greatest dimension in the coronal plane on today's exam is unchanged. *Subtraction images and postcontrast images without signs of overt enhancement of these areas the there is some limitation with respect to spatial resolution Spleen:  Normal size without focal splenic lesion. Adrenals/Urinary Tract:  Normal appearance of the adrenal glands. Hemorrhagic and/or proteinaceous cysts bilaterally. Signs of previous ablation in the upper pole the right kidney medially without evidence of residual or recurrent disease at this site. Stomach/Bowel: Bowel is normal to the extent visualized. Pelvic bowel loops are not imaged. Vascular/Lymphatic: Patent vascular structures in the abdomen. No signs of aneurysm. Other: No focal fluid collection or concerning finding. No abdominal wall hernia. Musculoskeletal: Signs of sclerosis and/or previous vertebroplasty in the thoracic spine. No acute bone process. IMPRESSION: 1. Post right renal ablation without signs of disease recurrence. 2. Multiple cysts of varying sizes in the bilateral kidneys, some with hemorrhagic or proteinaceous components, none with enhancing, suspicious features. 3. 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 No current  signs of nodular enhancement; however, slight interval enlargement of the largest cystic area. Six-month follow-up with MRI/MRCP is therefore suggested. Endoscopic ultrasound and sampling could also be considered. Electronically Signed   By: Zetta Bills M.D.   On: 09/17/2019 09:38   Mr 3d Recon At Scanner  Result Date: 09/17/2019 CLINICAL DATA:  Pancreatic cysts and history of renal cell carcinoma.  EXAM: MRI ABDOMEN WITHOUT AND WITH CONTRAST (INCLUDING MRCP) TECHNIQUE: Multiplanar multisequence MR imaging of the abdomen was performed both before and after the administration of intravenous contrast. Heavily T2-weighted images of the biliary and pancreatic ducts were obtained, and three-dimensional MRCP images were rendered by post processing. CONTRAST:  37mL GADAVIST GADOBUTROL 1 MMOL/ML IV SOLN COMPARISON:  08/09/2018 and multiple prior studies. FINDINGS: Lower chest: No signs of effusion. No signs of effusion new finding. Signs of vertebroplasty in vertebral hemangioma incidentally noted. Hepatobiliary: Hepatic cysts largest in the right hepatic lobe, unchanged from previous exams. Pancreas: Pancreatic cysts and mild ductal dilation unchanged from most recent comparison respiratory triggered high-resolution MRCP images are utilized for comparison *Dominant cluster of cystic foci superior to the pancreatic duct on coronal images measures 22 mm in greatest coronal dimension and is within 1-2 mm on prior exam but may be enlarged since 2017. Also on axial images, reconstructing the thin-section data maximum axial dimension approximately 17 mm as compared to 14 mm. All of these cystic lesions appear to show ductal dilation of side branch ducts and there is also mild prominence without frank dilation of main pancreatic duct. *Small lesion in the uncinate process measures approximately 10 mm. Similar size lesion in the neck of the pancreas also without change. *Cystic lesion in the tail of the pancreas at 12 mm in  greatest dimension in the coronal plane on today's exam is unchanged. *Subtraction images and postcontrast images without signs of overt enhancement of these areas the there is some limitation with respect to spatial resolution Spleen:  Normal size without focal splenic lesion. Adrenals/Urinary Tract:  Normal appearance of the adrenal glands. Hemorrhagic and/or proteinaceous cysts bilaterally. Signs of previous ablation in the upper pole the right kidney medially without evidence of residual or recurrent disease at this site. Stomach/Bowel: Bowel is normal to the extent visualized. Pelvic bowel loops are not imaged. Vascular/Lymphatic: Patent vascular structures in the abdomen. No signs of aneurysm. Other: No focal fluid collection or concerning finding. No abdominal wall hernia. Musculoskeletal: Signs of sclerosis and/or previous vertebroplasty in the thoracic spine. No acute bone process. IMPRESSION: 1. Post right renal ablation without signs of disease recurrence. 2. Multiple cysts of varying sizes in the bilateral kidneys, some with hemorrhagic or proteinaceous components, none with enhancing, suspicious features. 3. 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 No current signs of nodular enhancement; however, slight interval enlargement of the largest cystic area. Six-month follow-up with MRI/MRCP is therefore suggested. Endoscopic ultrasound and sampling could also be considered. Electronically Signed   By: Zetta Bills M.D.   On: 09/17/2019 09:38   Ir Radiologist Eval & Mgmt  Result Date: 09/19/2019 Please refer to notes tab for details about interventional procedure. (Op Note)   Labs:  CBC: Recent Labs    12/05/18 1120  WBC 5.2  HGB 13.7  HCT 38.9  PLT 276    COAGS: No results for input(s): INR, APTT in the last 8760 hours.  BMP: Recent Labs    12/05/18 1120 09/17/19 0824  NA  134  --   K 4.2  --   CL 93*  --   CO2 25  --   GLUCOSE 100*  --   BUN 12  --   CALCIUM 9.4  --   CREATININE 0.72 0.80  GFRNONAA 81  --   GFRAA 93  --     LIVER FUNCTION TESTS: Recent Labs  12/05/18 1120  BILITOT 0.6  AST 22  ALT 23  ALKPHOS 70  PROT 7.0  ALBUMIN 4.6    Assessment and Plan:  I spoke to Mrs. Lovena Le by phone and reviewed the follow-up MRI findings with her.  The latest MRI on 09/17/2019 shows some increased prominence of the cluster of cysts in the mid body of the pancreas with some increase in size of the larger of the cysts.  Small cysts in the uncinate and neck regions of the pancreas appear stable.  None of the cysts appear to demonstrate enhancing components.  The cluster of cysts remain suspicious for potential IPMN and appear to be associated with branch ducts.  Since the cysts in the body of the pancreas show some increase in prominence, I recommended that she be followed by a gastroenterologist that performs endoscopic ultrasound and EUS biopsy.  We will make a referral for her to see Dr. Ardis Hughs.  From the perspective of the kidneys, there is no longer any evidence of potential enhancement of any of the renal cysts bilaterally and the ablation site of the right kidney continues to demonstrate no evidence of tumor recurrence.  No further routine imaging of the kidneys is necessary.   Electronically Signed: Azzie Roup 09/19/2019, 8:53 AM     I spent a total of  15 Minutes in remote  clinical consultation, greater than 50% of which was counseling/coordinating care post renal ablation.    Visit type: Audio only (telephone). Audio (no video) only due to patient's lack of internet/smartphone capability. Alternative for in-person consultation at [facility], [address], [place], [place]. This visit type was conducted due to national recommendations for restrictions regarding the COVID-19 Pandemic (e.g. social distancing).  This format is  felt to be most appropriate for this patient at this time.  All issues noted in this document were discussed and addressed.

## 2019-09-19 NOTE — Telephone Encounter (Signed)
Dr. Kathlene Cote has referred  Ms. Theresa Mccarty to Dr. Ardis Hughs in his note. The patient forgot she saw Dr. Benson Norway in the past. I have called and made an appt with Dr. Benson Norway for Ms. Theresa Mccarty to see Dr. Benson Norway for cluster of cysts in the pancreas. The patient agrees and will see Dr. Benson Norway on Monday./vm

## 2019-09-20 ENCOUNTER — Other Ambulatory Visit: Payer: Self-pay | Admitting: Internal Medicine

## 2019-09-20 DIAGNOSIS — E785 Hyperlipidemia, unspecified: Secondary | ICD-10-CM

## 2019-09-23 DIAGNOSIS — R933 Abnormal findings on diagnostic imaging of other parts of digestive tract: Secondary | ICD-10-CM | POA: Diagnosis not present

## 2019-09-23 DIAGNOSIS — K862 Cyst of pancreas: Secondary | ICD-10-CM | POA: Diagnosis not present

## 2019-10-24 ENCOUNTER — Other Ambulatory Visit: Payer: Self-pay | Admitting: Internal Medicine

## 2019-10-24 NOTE — Telephone Encounter (Signed)
Refill Request.  

## 2019-11-06 DIAGNOSIS — G4733 Obstructive sleep apnea (adult) (pediatric): Secondary | ICD-10-CM | POA: Diagnosis not present

## 2019-11-22 ENCOUNTER — Telehealth: Payer: Self-pay | Admitting: Cardiovascular Disease

## 2019-11-22 NOTE — Telephone Encounter (Signed)
Patient needs to speak with Theresa Mccarty about sleep supplies.

## 2019-11-26 ENCOUNTER — Telehealth: Payer: Self-pay | Admitting: *Deleted

## 2019-11-26 NOTE — Telephone Encounter (Signed)
Left message I am returning a call to her. Call me back if she still needs assistance.

## 2019-11-26 NOTE — Telephone Encounter (Signed)
Patient called in to inform me that her CPAP machine is about to die. She is getting a message about the motor life has been exceeded. New machine will be ordered  Once I have all of the needed documentation.

## 2019-11-27 ENCOUNTER — Telehealth: Payer: Self-pay | Admitting: *Deleted

## 2019-11-27 NOTE — Telephone Encounter (Signed)
Informed the patient that Dr Debara Pickett has provided me with the documentation needed for [facility] to order her a new CPAP machine. I spoke to [place] and she states that she will start working on this. If any additional documentation is needed she will let me know. Patient advised to keep check with [facility] for machine status updates.

## 2019-12-05 ENCOUNTER — Other Ambulatory Visit: Payer: Self-pay | Admitting: Family Medicine

## 2019-12-05 DIAGNOSIS — M810 Age-related osteoporosis without current pathological fracture: Secondary | ICD-10-CM

## 2019-12-07 NOTE — Progress Notes (Signed)
Chief Complaint  Patient presents with  . Medicare Wellness    fasting AWV/CPE-no new concerns. Has not seen dentist yet due to [place]. Has not yet gotten living will notarized due to [place]. Was referred to Dr Benson Norway instead of Dr. Ardis Hughs because she had been there before.     Theresa Mccarty is a 78 y.o. female who presents for annual wellness visit and follow-up on chronic medical conditions.  She has no specific complaints today.  Osteoporosis:  Last DEXA was in 12/2017, showing osteoporosis, with T-2.7 at R fem neck.  This was done at the [facility], a different location than prior DEXA's, due to [place] no longer taking her insurance. She was started back on weekly alendronate in 01/2018, and is tolerating this without side effects. She denies dysphagia, chest pain. (She was previously treated with fosamax and Boniva. She had been off meds for at least8years, didn't recall why the medication was stopped. Her DEXA in 2009 was while still taking Boniva, and she had another DEXA 10/2012 which showed T-2.4 and decline in bone density at the left hip.)   Hypothyroidism: Compliant with taking thyroid medications on an empty stomach, separate from other medications. She feels well, denies any thyroid-related symptoms. No hair/skin/bowel/mood/energy/weight changes.  Lab Results  Component Value Date   TSH 2.640 08/13/2019   Hypertension follow-up: Compliant with cardizem and valsartan HCT.  Blood pressures are running one-teens up to 140's, mostly 120's-130's/80's.  Recalls it was 125/83 per pt at Dr. Lynne Logan office (somewhere in that range).  She forgot to bring her list to today's visit.  Plans to bring it to her visit with [name] in March. Denies dizziness, headaches. Denies side effects of medications.  CAD, hyperlipidemia and atrial fibrillation:She is under the care of [name], last seen in August. She is scheduled to f/u in March.  She has h/oCAD, s/p PCI of proximal LAD in 2008.Dr.  Debara Pickett also manages her lipidsand atrial fibrillation.Failed cardioversion in 04/2017. She is on Diltiazem and Xarelto.She denies tachycardia, palpitations. She only rarely gets chest pain, and she can't tell if it is muscular (from using wire cutters). She gets some shortness of breath when going upstairs, but the more active she is, and the more she uses the treadmill, she has less shortness of breath. Denies any dyspnea when on the treadmill, nor any chest pain. She denies significant bleeding or bruising(bruises easily, not any different). She didn't tolerate atorvastatin, so had been changed to Zetia, and last year (05/2018) Crestor was added 44m 3x/week.  At her 07/2019 visit she was no longer taking the Crestor. She reports stopping due to joints and muscles aching.  She tries to follow lowfat, low cholesterol diet. She has follow up with Dr. HDebara Pickettin March to re-check and re-address her lipids. Lab Results  Component Value Date   CHOL 181 08/13/2019   HDL 78 08/13/2019   LDLCALC 93 08/13/2019   TRIG 52 08/13/2019   CHOLHDL 2.3 08/13/2019   OSA--on CPAP; reports compliancewith use of CPAP. She picks up a new one tomorrow. She feels refreshed in the mornings. Denies significant daytime somnolence(except when she has a poor night sleep due to insomnia).  H/o Renal Cell Cancer, treated in 07/2010 with cryoablation. She sees Dr. BAlinda Moneyyearly, last in 08/2019,. She had MRI in 08/2019 and had virtual visit with Dr. YKathlene Cotein October. He reported that the MRI showed "increased prominence" of the cluster of cysts in the pancreas, and referred her to  Dr. Ardis Hughs.  Kidneys looked good, and no further imaging was needed, per Dr. Kathlene Cote.   She saw Dr. Benson Norway instead of Dr. Ardis Hughs  She saw Dr. Benson Norway (because she had seen him in the past), but didn't feel comfortable in the office (related to [place]).  No notes were received. Plan is for recheck in 6 months to a year.  She wants her sugar checked.  She  admits to more sweets in the last year than ever.  Recently celebrated her 67th wedding anniversary (and had cake and ice cream).  Immunization History  Administered Date(s) Administered  . Fluad Quad(high Dose 65+) 09/16/2019  . Influenza, High Dose Seasonal PF 10/16/2013, 12/01/2014, 09/24/2015, 08/18/2016, 08/30/2017, 12/05/2018  . Pneumococcal Conjugate-13 08/06/2014  . Pneumococcal Polysaccharide-23 08/09/2012  . Td 01/04/2006  . Tdap 08/09/2012  . Zoster 02/16/2014   Last Pap smear: 2009; S/p hysterectomy in '75  Last mammogram: 12/2017 Last colonoscopy: 07/26/11  Last DEXA:  12/2017, showing osteoporosis, with T-2.7 at R fem neck. Dentist: past due, many years. She has dental coverage, still hasn't scheduled. She states she has 3 teeth that need work on. Ophtho:Yearly. Had cataract surgery in (08/2019), wears reading glasses. Exercise:Treadmill 4-7x/week, for 45-60 minutes (occasionally only 35 minutes). Lifts heavy flower arrangements/bowls.  Has some handweights, only usually occasionally.  Alcohol intake: At her 07/2019 visit with [name], reported having 3-4 glasses of wine daily. She cut back to 1-3 drinks 5x/week.  She has occasional martini.  She has some virgin Nurse, learning disability.  Other doctors caring for patient include: Dr. Alinda Money (urology) and Dr. Kathlene Cote [name] (cardiology) Dr. Claiborne Billings for OSA(hasn't seen in a while) Derm at [facility]. Dr. [name] (ophtho) [employer] for hearing (had hearing aids) Doesn't have a dentist GI: Dr. Benson Norway  Depression screen: negative Fall screen:none Functional Status survey:unremarkable. Mini-Cog screen: Normal  See full screens in epic  End of Life Discussion: Patient hasa living will and medical power of attorney, but hasn't yet gotten it notarized.  She thinks she misplaced the forms (given new ones today).   PMH, PSH, SH and FH were reviewed and updated  Outpatient Encounter Medications as of 12/09/2019  Medication Sig   . alendronate (FOSAMAX) 70 MG tablet Take 70 mg by mouth once a week. Take with a full glass of water on an empty stomach.  [name] aspirin EC 81 MG tablet Take 1 tablet (81 mg total) by mouth daily.  . B Complex Vitamins (B-COMPLEX/B-12 PO) Take 1 tablet by mouth daily.    . Calcium-Magnesium-Zinc 500-250-12.5 MG TABS Take 1 tablet by mouth daily.  . Cholecalciferol (VITAMIN D) 2000 units tablet Take 2,000 Units by mouth daily.  . Coenzyme Q10 (COQ10) 100 MG CAPS Take 1 tablet by mouth daily.   [name] diltiazem (CARDIZEM CD) 240 MG 24 hr capsule TAKE 1 CAPSULE BY MOUTH EVERY DAY  . ezetimibe (ZETIA) 10 MG tablet TAKE 1 TABLET BY MOUTH EVERY DAY  . levothyroxine (SYNTHROID) 50 MCG tablet TAKE 1 TABLET BY MOUTH EVERY DAY  . MILK THISTLE PO Take 1 capsule by mouth daily.  . Polyvinyl Alcohol-Povidone (REFRESH OP) Place 1 drop into both eyes daily.  . Probiotic Product (PROBIOTIC PO) Take 1 capsule by mouth daily.  . valsartan-hydrochlorothiazide (DIOVAN-HCT) 320-25 MG tablet TAKE 1 TABLET BY MOUTH EVERY DAY  . XARELTO 20 MG TABS tablet TAKE 1 TABLET BY MOUTH DAILY WITH SUPPER  . nitroGLYCERIN (NITROSTAT) 0.4 MG SL tablet PLACE 1 TABLET (0.4 MG TOTAL) UNDER THE TONGUE  EVERY 5 (FIVE) MINUTES AS NEEDED FOR CHEST PAIN. (Patient not taking: Reported on 12/09/2019)  . [DISCONTINUED] alendronate (FOSAMAX) 70 MG tablet TAKE 1 TABLET BY MOUTH EVERY 7 DAYS. TAKE WITH A FULL GLASS OF WATER ON AN EMPTY STOMACH.   No facility-administered encounter medications on file as of 12/09/2019.   Allergies  Allergen Reactions  . Atorvastatin Other (See Comments)    Muscle aches  . Celebrex [Celecoxib] Other (See Comments)    Muscle aches  . Nitrofurantoin Nausea Only and Rash    ROS: The patient denies anorexia, fever, headaches, vision changes, ear pain, sore throat, breast concerns, palpitations, dizziness, syncope, dyspnea on exertion, cough, swelling, nausea, vomiting, diarrhea, constipation, abdominal pain, melena,  hematochezia, indigestion/heartburn, hematuria, incontinence, dysuria, vaginal bleeding, discharge, odor, genital lesions, new joint pains (chronic back, knees), numbness, tingling, weakness, tremor, suspicious skin lesions, depression, anxiety, abnormal bleeding or enlarged lymph nodes. +easy bruising (unchanged) +hearing loss-- hashearing aids. Mild allergies with weather changes, chronic runny nose from working with the flowers and dust. Occasional cough (thinks related to the dust, silk flowers) Occasional CP, unsure if MSK or not, per HPI. Some external vaginal itching (at hairline), chronic/intermittent. Dental problems--3 teeth need attention, plans to schedule. Up once or twice a night to void. Sleeps well for 3 hours, then wakes up, has trouble getting back to sleep.   PHYSICAL EXAM:  BP 138/84   Pulse 84   Temp 98 F (36.7 C) (Other (Comment))   Ht '5\' 2"'  (1.575 m)   Wt 165 lb 6.4 oz (75 kg)   BMI 30.25 kg/m   Wt Readings from Last 3 Encounters:  12/09/19 165 lb 6.4 oz (75 kg)  08/13/19 163 lb 9.6 oz (74.2 kg)  12/05/18 166 lb (75.3 kg)    General Appearance:  Alert, cooperative, no distress, appears stated age   Head:  Normocephalic, without obvious abnormality, atraumatic   Eyes:  PERRL, conjunctiva/corneas clear, EOM's intact, fundibenignt.  Ears:  Normal TM's and EAC's; no cerumen  Nose:  Not examined, wearing mask due to COVID-19 pandemic   Throat:  Not examined, wearing mask due to COVID-19 pandemic  Neck:  Supple, no lymphadenopathy; thyroid: no enlargement/tenderness/nodules; no carotid bruit or JVD   Back:  Spine nontender, no curvature, ROM normal, no CVA tenderness   Lungs:  Clear to auscultation bilaterally without wheezes, rales or ronchi; respirations unlabored   Chest Wall:  No tenderness or deformity   Heart:  Irregularly irregular, normal rate, S1 and S2 normal, no murmur, rub or gallop   Breast Exam:  No tenderness, masses,  or nipple discharge or inversion. No axillary lymphadenopathy   Abdomen:  Soft, non-tender, nondistended, normoactive bowel sounds, no masses, no hepatosplenomegaly   Genitalia:  Normal external genitalia, some atrophic changes noted. No erythema, rash or lesions.  BUS and vagina normal. No abnormal vaginal discharge. Uterus surgically absent and adnexa not palpable--nontender, no masses. Pap not performed   Rectal:  Normal tone, no masses or tenderness; guaiac negative stool   Extremities:  No clubbing, cyanosis or edema.  Pulses:  2+ and symmetric all extremities   Skin:  Skin color, texture, turgor normal. Actinic changes to skin throughout; small ecchymosis on hands/forearmsand lower legs.  Lymph nodes:  Cervical, supraclavicular, and axillary nodes normal   Neurologic:  CNII-XII intact, normal strength, sensation and gait; reflexes 2+ and symmetric throughout            Psych:   Normal mood, affect, hygiene and grooming  ASSESSMENT/PLAN:  Annual physical exam - Plan: CBC with Differential, Comprehensive metabolic panel  Medicare annual wellness visit, subsequent  Osteoporosis, unspecified osteoporosis type, unspecified pathological fracture presence - cont alendronate, discussed Ca, D, wt-bearing exercise.  repeat DEXA 1-01/2020 - Plan: DG Bone Density  Hypothyroidism, unspecified type - adequately replaced per labs in August, cont  Essential hypertension, benign - borderine here today, better at home. Cont to monitor, along with current meds, Low Na diet, exercise, wt loss. Bring BP list to cardiology visit - Plan: Comprehensive metabolic panel  Pure hypercholesterolemia - goal LDL<70 (CAD). She has been above this on zetia, intolerant of Crestor and lipitor. Being followed by cardiology, recheck March. May need PSK9  OSA on CPAP - compliant with care and continues to benefit from CPAP  Persistent atrial fibrillation (HCC) - rate controlled, on  anticoagulants. 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 Get Dr. Ulyses Amor records. Will let us know if she'd like 2nd opinion from Dr. Edison Nasuti  Insomnia, unspecified type - falls asleep fine, then wakes up and can't get back to sleep. Discussed sleep hygiene; alcohol likely contributes  BMI 30.0-30.9,adult - counseled re: low carb diet, portions, no skipped meals, exercise   CBC, c-met  Discussed monthly self breast exams and yearly mammograms (past due, reminded to schedule)); at least 30 minutes of aerobic activity at least 5 days/week, weight-bearing exercise 2x/week; proper sunscreen use reviewed; healthy diet, including goals of calcium and vitamin D intake and alcohol recommendations (less than or equal to 1 drink/day) reviewed; regular seatbelt use; changing batteries in smoke detectors. Immunization recommendations UTD, continue yearly high dose flu,shot. RecommendedShingrix, counseled re: side effects/efficacy, to get from pharmacy. COVID vaccine recommended when available.Colonoscopy recommendations reviewed, UTD   Pt reminded to get living [place] POA notarized. She was given another set of forms, in case she cannot find the ones she has at home. Full Code, Full Care  Will consider if she wants to change GI, for 2nd opinion. (though [place])  DEXA due 12/2019 mammo past due, reminded to schedule, to schedule both for 12/2019 Shingrix rec, risks/SE reviewed  Counseled on insomnia, effect of alcohol on sleep and overall health.  Encouraged to cut back on alcoholto just 1 glass of wine/alcoholic beverage daily. Counseled re: diet and exercise, and weight loss.  Encouraged her to cut back on sugary drinks  (including virgin [employer]).    Medicare Attestation I have personally reviewed: The patient's medical and social history Their use of alcohol, tobacco or illicit drugs Their current medications and supplements The patient's functional ability including ADLs,fall risks, home safety risks, cognitive, and hearing and visual impairment Diet and physical activities Evidence for depression or mood disorders  The patient's weight, height, BM have been recorded in the chart.  I have made referrals, counseling, and provided education to the patient based on review of the above and I have provided the patient with a written personalized care plan for preventive services.

## 2019-12-07 NOTE — Patient Instructions (Addendum)
HEALTH MAINTENANCE RECOMMENDATIONS:  It is recommended that you get at least 30 minutes of aerobic exercise at least 5 days/week (for weight loss, you may need as much as 60-90 minutes). This can be any activity that gets your heart rate up. This can be divided in 10-15 minute intervals if needed, but try and build up your endurance at least once a week.  Weight bearing exercise is also recommended twice weekly.  Eat a healthy diet with lots of vegetables, fruits and fiber.  "Colorful" foods have a lot of vitamins (ie green vegetables, tomatoes, red peppers, etc).  Limit sweet tea, regular sodas and alcoholic beverages, all of which has a lot of calories and sugar.  Up to 1 alcoholic drink daily may be beneficial for women (unless trying to lose weight, watch sugars).  Drink a lot of water.  Calcium recommendations are 1200-1500 mg daily (1500 mg for postmenopausal women or women without ovaries), and vitamin D 1000 IU daily.  This should be obtained from diet and/or supplements (vitamins), and calcium should not be taken all at once, but in divided doses.  Monthly self breast exams and yearly mammograms for women over the age of 5 is recommended.  Sunscreen of at least SPF 30 should be used on all sun-exposed parts of the skin when outside between the hours of 10 am and 4 pm (not just when at beach or pool, but even with exercise, golf, tennis, and yard work!)  Use a sunscreen that says "broad spectrum" so it covers both UVA and UVB rays, and make sure to reapply every 1-2 hours.  Remember to change the batteries in your smoke detectors when changing your clock times in the spring and fall. Carbon monoxide detectors are recommended for your home.  Use your seat belt every time you are in a car, and please drive safely and not be distracted with cell phones and texting while driving.   Ms. Nicolich , Thank you for taking time to come for your Medicare Wellness Visit. I appreciate your ongoing  commitment to your health goals. Please review the following plan we discussed and let me know if I can assist you in the future.   This is a list of the screening recommended for you and due dates:  Health Maintenance  Topic Date Due  . Tetanus Vaccine  08/09/2022  . Flu Shot  Completed  . DEXA scan (bone density measurement)  Completed  . Pneumonia vaccines  Completed   Yearly mammograms are recommended (last was 12/2017). Bone density test is due in Jan-Feb 2021 (2 year follow-up after restarting alendronate).  You can schedule both the bone density and mammogram for the same day.  I recommend getting the new shingles vaccine (Shingrix). You will need to get this from the pharmacy rather than our office, as it is covered by Medicare part D.  It is a series of 2 injections, spaced 2 months apart.  COVID vaccine is recommended when available.  Please schedule your routine dental cleaning.  Cut back on alcohol intake to just 1/day (and not daily, if your sleep improves) Sleep should be better without alcohol.   Insomnia Insomnia is a sleep disorder that makes it difficult to fall asleep or stay asleep. Insomnia can cause fatigue, low energy, difficulty concentrating, mood swings, and poor performance at work or school. There are three different ways to classify insomnia:  Difficulty falling asleep.  Difficulty staying asleep.  Waking up too early in the morning. Any  type of insomnia can be long-term (chronic) or short-term (acute). Both are common. Short-term insomnia usually lasts for three months or less. Chronic insomnia occurs at least three times a week for longer than three months. What are the causes? Insomnia may be caused by another condition, situation, or substance, such as:  Anxiety.  Certain medicines.  Gastroesophageal reflux disease (GERD) or other gastrointestinal conditions.  Asthma or other breathing conditions.  Restless legs syndrome, sleep apnea, or  other sleep disorders.  Chronic pain.  Menopause.  Stroke.  Abuse of alcohol, tobacco, or illegal drugs.  Mental health conditions, such as depression.  Caffeine.  Neurological disorders, such as Alzheimer's disease.  An overactive thyroid (hyperthyroidism). Sometimes, the cause of insomnia may not be known. What increases the risk? Risk factors for insomnia include:  Gender. Women are affected more often than men.  Age. Insomnia is more common as you get older.  Stress.  Lack of exercise.  Irregular work schedule or working night shifts.  Traveling between different time zones.  Certain medical and mental health conditions. What are the signs or symptoms? If you have insomnia, the main symptom is having trouble falling asleep or having trouble staying asleep. This may lead to other symptoms, such as:  Feeling fatigued or having low energy.  Feeling nervous about going to sleep.  Not feeling rested in the morning.  Having trouble concentrating.  Feeling irritable, anxious, or depressed. How is this diagnosed? This condition may be diagnosed based on:  Your symptoms and medical history. Your health care provider may ask about: ? Your sleep habits. ? Any medical conditions you have. ? Your mental health.  A physical exam. How is this treated? Treatment for insomnia depends on the cause. Treatment may focus on treating an underlying condition that is causing insomnia. Treatment may also include:  Medicines to help you sleep.  Counseling or therapy.  Lifestyle adjustments to help you sleep better. Follow these instructions at home: Eating and drinking   Limit or avoid alcohol, caffeinated beverages, and cigarettes, especially close to bedtime. These can disrupt your sleep.  Do not eat a large meal or eat spicy foods right before bedtime. This can lead to digestive discomfort that can make it hard for you to sleep. Sleep habits   Keep a sleep diary  to help you and your health care provider figure out what could be causing your insomnia. Write down: ? When you sleep. ? When you wake up during the night. ? How well you sleep. ? How rested you feel the next day. ? Any side effects of medicines you are taking. ? What you eat and drink.  Make your bedroom a dark, comfortable place where it is easy to fall asleep. ? Put up shades or blackout curtains to block light from outside. ? Use a white noise machine to block noise. ? Keep the temperature cool.  Limit screen use before bedtime. This includes: ? Watching TV. ? Using your smartphone, tablet, or computer.  Stick to a routine that includes going to bed and waking up at the same times every day and night. This can help you fall asleep faster. Consider making a quiet activity, such as reading, part of your nighttime routine.  Try to avoid taking naps during the day so that you sleep better at night.  Get out of bed if you are still awake after 15 minutes of trying to sleep. Keep the lights down, but try reading or doing a quiet activity.  When you feel sleepy, go back to bed. General instructions  Take over-the-counter and prescription medicines only as told by your health care provider.  Exercise regularly, as told by your health care provider. Avoid exercise starting several hours before bedtime.  Use relaxation techniques to manage stress. Ask your health care provider to suggest some techniques that may work well for you. These may include: ? Breathing exercises. ? Routines to release muscle tension. ? Visualizing peaceful scenes.  Make sure that you drive carefully. Avoid driving if you feel very sleepy.  Keep all follow-up visits as told by your health care provider. This is important. Contact a health care provider if:  You are tired throughout the day.  You have trouble in your daily routine due to sleepiness.  You continue to have sleep problems, or your sleep  problems get worse. Get help right away if:  You have serious thoughts about hurting yourself or someone else. If you ever feel like you may hurt yourself or others, or have thoughts about taking your own life, get help right away. You can go to your nearest emergency department or call:  Your local emergency services (911 in the [place]  A suicide crisis helpline, such as the [place] at 707 159 6092. This is open 24 hours a day. Summary  Insomnia is a sleep disorder that makes it difficult to fall asleep or stay asleep.  Insomnia can be long-term (chronic) or short-term (acute).  Treatment for insomnia depends on the cause. Treatment may focus on treating an underlying condition that is causing insomnia.  Keep a sleep diary to help you and your health care provider figure out what could be causing your insomnia. This information is not intended to replace advice given to you by your health care provider. Make sure you discuss any questions you have with your health care provider. Document Released: 12/02/2000 Document Revised: 11/17/2017 Document Reviewed: 09/14/2017 Elsevier Patient Education  2020 [employer].

## 2019-12-09 ENCOUNTER — Ambulatory Visit (INDEPENDENT_AMBULATORY_CARE_PROVIDER_SITE_OTHER): Payer: Medicare Other | Admitting: Family Medicine

## 2019-12-09 ENCOUNTER — Encounter: Payer: Self-pay | Admitting: Family Medicine

## 2019-12-09 ENCOUNTER — Other Ambulatory Visit: Payer: Self-pay

## 2019-12-09 VITALS — BP 138/84 | HR 84 | Temp 98.0°F | Ht 62.0 in | Wt 165.4 lb

## 2019-12-09 DIAGNOSIS — E7800 Pure hypercholesterolemia, unspecified: Secondary | ICD-10-CM

## 2019-12-09 DIAGNOSIS — Z0000 Encounter for general adult medical examination without abnormal findings: Secondary | ICD-10-CM

## 2019-12-09 DIAGNOSIS — I10 Essential (primary) hypertension: Secondary | ICD-10-CM

## 2019-12-09 DIAGNOSIS — G4700 Insomnia, unspecified: Secondary | ICD-10-CM

## 2019-12-09 DIAGNOSIS — G4733 Obstructive sleep apnea (adult) (pediatric): Secondary | ICD-10-CM

## 2019-12-09 DIAGNOSIS — I2510 Atherosclerotic heart disease of native coronary artery without angina pectoris: Secondary | ICD-10-CM

## 2019-12-09 DIAGNOSIS — Z7901 Long term (current) use of anticoagulants: Secondary | ICD-10-CM | POA: Diagnosis not present

## 2019-12-09 DIAGNOSIS — M810 Age-related osteoporosis without current pathological fracture: Secondary | ICD-10-CM

## 2019-12-09 DIAGNOSIS — E039 Hypothyroidism, unspecified: Secondary | ICD-10-CM

## 2019-12-09 DIAGNOSIS — Z6830 Body mass index (BMI) 30.0-30.9, adult: Secondary | ICD-10-CM

## 2019-12-09 DIAGNOSIS — Z9989 Dependence on other enabling machines and devices: Secondary | ICD-10-CM

## 2019-12-09 DIAGNOSIS — I4819 Other persistent atrial fibrillation: Secondary | ICD-10-CM

## 2019-12-09 DIAGNOSIS — Z5181 Encounter for therapeutic drug level monitoring: Secondary | ICD-10-CM | POA: Diagnosis not present

## 2019-12-09 DIAGNOSIS — R935 Abnormal findings on diagnostic imaging of other abdominal regions, including retroperitoneum: Secondary | ICD-10-CM

## 2019-12-09 DIAGNOSIS — I2583 Coronary atherosclerosis due to lipid rich plaque: Secondary | ICD-10-CM

## 2019-12-10 DIAGNOSIS — G4733 Obstructive sleep apnea (adult) (pediatric): Secondary | ICD-10-CM | POA: Diagnosis not present

## 2019-12-10 LAB — COMPREHENSIVE METABOLIC PANEL
ALT: 19 IU/L (ref 0–32)
AST: 18 IU/L (ref 0–40)
Albumin/Globulin Ratio: 1.9 (ref 1.2–2.2)
Albumin: 4.6 g/dL (ref 3.7–4.7)
Alkaline Phosphatase: 68 IU/L (ref 39–117)
BUN/Creatinine Ratio: 17 (ref 12–28)
BUN: 14 mg/dL (ref 8–27)
Bilirubin Total: 0.5 mg/dL (ref 0.0–1.2)
CO2: 24 mmol/L (ref 20–29)
Calcium: 9.7 mg/dL (ref 8.7–10.3)
Chloride: 92 mmol/L — ABNORMAL LOW (ref 96–106)
Creatinine, Ser: 0.83 mg/dL (ref 0.57–1.00)
GFR calc Af Amer: 78 mL/min/{1.73_m2} (ref >59)
GFR calc non Af Amer: 68 mL/min/{1.73_m2} (ref >59)
Globulin, Total: 2.4 g/dL (ref 1.5–4.5)
Glucose: 102 mg/dL — ABNORMAL HIGH (ref 65–99)
Potassium: 4 mmol/L (ref 3.5–5.2)
Sodium: 133 mmol/L — ABNORMAL LOW (ref 134–144)
Total Protein: 7 g/dL (ref 6.0–8.5)

## 2019-12-10 LAB — CBC WITH DIFFERENTIAL/PLATELET
Basophils Absolute: 0.1 10*3/uL (ref 0.0–0.2)
Basos: 1 %
EOS (ABSOLUTE): 0.1 10*3/uL (ref 0.0–0.4)
Eos: 2 %
Hematocrit: 43.3 % (ref 34.0–46.6)
Hemoglobin: 15 g/dL (ref 11.1–15.9)
Immature Grans (Abs): 0 10*3/uL (ref 0.0–0.1)
Immature Granulocytes: 0 %
Lymphocytes Absolute: 1.5 10*3/uL (ref 0.7–3.1)
Lymphs: 25 %
MCH: 33.4 pg — ABNORMAL HIGH (ref 26.6–33.0)
MCHC: 34.6 g/dL (ref 31.5–35.7)
MCV: 96 fL (ref 79–97)
Monocytes Absolute: 0.5 10*3/uL (ref 0.1–0.9)
Monocytes: 9 %
Neutrophils Absolute: 3.7 10*3/uL (ref 1.4–7.0)
Neutrophils: 63 %
Platelets: 289 10*3/uL (ref 150–450)
RBC: 4.49 x10E6/uL (ref 3.77–5.28)
RDW: 12 % (ref 11.7–15.4)
WBC: 5.9 10*3/uL (ref 3.4–10.8)

## 2019-12-16 ENCOUNTER — Other Ambulatory Visit: Payer: Self-pay | Admitting: Family Medicine

## 2019-12-16 DIAGNOSIS — Z1231 Encounter for screening mammogram for malignant neoplasm of breast: Secondary | ICD-10-CM

## 2019-12-16 DIAGNOSIS — M810 Age-related osteoporosis without current pathological fracture: Secondary | ICD-10-CM

## 2019-12-16 DIAGNOSIS — E2839 Other primary ovarian failure: Secondary | ICD-10-CM

## 2020-01-03 ENCOUNTER — Other Ambulatory Visit: Payer: Self-pay | Admitting: Family Medicine

## 2020-01-03 DIAGNOSIS — E039 Hypothyroidism, unspecified: Secondary | ICD-10-CM

## 2020-01-07 DIAGNOSIS — Z961 Presence of intraocular lens: Secondary | ICD-10-CM | POA: Diagnosis not present

## 2020-01-10 DIAGNOSIS — G4733 Obstructive sleep apnea (adult) (pediatric): Secondary | ICD-10-CM | POA: Diagnosis not present

## 2020-01-23 ENCOUNTER — Encounter: Payer: Self-pay | Admitting: *Deleted

## 2020-02-10 DIAGNOSIS — G4733 Obstructive sleep apnea (adult) (pediatric): Secondary | ICD-10-CM | POA: Diagnosis not present

## 2020-02-17 ENCOUNTER — Encounter: Payer: Self-pay | Admitting: Internal Medicine

## 2020-02-17 ENCOUNTER — Ambulatory Visit: Payer: Medicare Other | Admitting: Internal Medicine

## 2020-02-17 ENCOUNTER — Other Ambulatory Visit: Payer: Self-pay

## 2020-02-17 VITALS — BP 148/88 | HR 88 | Temp 97.3°F | Ht 62.0 in | Wt 167.2 lb

## 2020-02-17 DIAGNOSIS — I4819 Other persistent atrial fibrillation: Secondary | ICD-10-CM

## 2020-02-17 DIAGNOSIS — Z9989 Dependence on other enabling machines and devices: Secondary | ICD-10-CM | POA: Diagnosis not present

## 2020-02-17 DIAGNOSIS — E782 Mixed hyperlipidemia: Secondary | ICD-10-CM

## 2020-02-17 DIAGNOSIS — Z7901 Long term (current) use of anticoagulants: Secondary | ICD-10-CM | POA: Diagnosis not present

## 2020-02-17 DIAGNOSIS — G4733 Obstructive sleep apnea (adult) (pediatric): Secondary | ICD-10-CM | POA: Diagnosis not present

## 2020-02-17 LAB — LIPID PANEL
Chol/HDL Ratio: 1.9 ratio (ref 0.0–4.4)
Cholesterol, Total: 182 mg/dL (ref 100–199)
HDL: 95 mg/dL (ref >39)
LDL Chol Calc (NIH): 77 mg/dL (ref 0–99)
Triglycerides: 49 mg/dL (ref 0–149)
VLDL Cholesterol Cal: 10 mg/dL (ref 5–40)

## 2020-02-17 LAB — CBC
Hematocrit: 39.4 % (ref 34.0–46.6)
Hemoglobin: 13.9 g/dL (ref 11.1–15.9)
MCH: 33.3 pg — ABNORMAL HIGH (ref 26.6–33.0)
MCHC: 35.3 g/dL (ref 31.5–35.7)
MCV: 94 fL (ref 79–97)
Platelets: 254 10*3/uL (ref 150–450)
RBC: 4.18 x10E6/uL (ref 3.77–5.28)
RDW: 11.9 % (ref 11.7–15.4)
WBC: 5.5 10*3/uL (ref 3.4–10.8)

## 2020-02-17 LAB — COMPREHENSIVE METABOLIC PANEL
ALT: 16 IU/L (ref 0–32)
AST: 20 IU/L (ref 0–40)
Albumin/Globulin Ratio: 1.8 (ref 1.2–2.2)
Albumin: 4.3 g/dL (ref 3.7–4.7)
Alkaline Phosphatase: 63 IU/L (ref 39–117)
BUN/Creatinine Ratio: 19 (ref 12–28)
BUN: 14 mg/dL (ref 8–27)
Bilirubin Total: 0.5 mg/dL (ref 0.0–1.2)
CO2: 24 mmol/L (ref 20–29)
Calcium: 9.6 mg/dL (ref 8.7–10.3)
Chloride: 95 mmol/L — ABNORMAL LOW (ref 96–106)
Creatinine, Ser: 0.74 mg/dL (ref 0.57–1.00)
GFR calc Af Amer: 90 mL/min/{1.73_m2} (ref >59)
GFR calc non Af Amer: 78 mL/min/{1.73_m2} (ref >59)
Globulin, Total: 2.4 g/dL (ref 1.5–4.5)
Glucose: 90 mg/dL (ref 65–99)
Potassium: 4.1 mmol/L (ref 3.5–5.2)
Sodium: 133 mmol/L — ABNORMAL LOW (ref 134–144)
Total Protein: 6.7 g/dL (ref 6.0–8.5)

## 2020-02-17 NOTE — Progress Notes (Signed)
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 Recently, she developed substernal chest pain in the fall and underwent cardiac catheterization. This demonstrated patent LAD stent that was proximal as well as 30% disease of the LAD just prior to that stent, but no other areas of obstructive coronary disease. She continued to have chest pain; however, I felt it was most likely related to stress. I have also encouraged dietary changes as well as trying to reduce her stress and alcohol intake. She has accomplished that to some extent and, over the past 6 months has had no other symptoms of chest discomfort. She occasionally gets short of breath with heavy exertion and has some swelling in her legs only when standing up for long periods of time. She continues to work at [employer] although there is some stress she is actually maintaining her exercise regimen. This is clearly demonstrated improvement in her risk factors including a reduction in cholesterol. A recent lipid NMR demonstrated a particle number of 631 and an LDL cholesterol of 81.   Theresa Mccarty returns for annual followup today. She reports feeling very well. She recently had the shingles vaccine at [facility]. She had some questions today about starting coconut oil glucosamine/chondroitin which I think are fine. She did have one episode of discomfort in her chest after working in regard for which she took nitroglycerin however she noted no improvement in her symptoms. She is very interested in decreasing her Lipitor she does report some soreness in her arm muscles as well as her leg muscles. She believes this is related to the statin. She has not been on any other statin since her stent was placed in 2008. She is interested a recheck of  her cholesterol today as well as a screening hemoglobin A1c - she is nondiabetic.  Finally, she recently had a fall at home which was due to tripping over a table leg. She did have some soreness in her right hand as well as ecchymosis and swelling of her digits and 2 bandages on her right toes.  [name] has no specific complaints today. She denies any chest pain or worsening shortness of breath. Blood pressure is at goal. She continues to take aspirin and Plavix without any bleeding complications. Cholesterol is due for recheck. She does report some mild calf tenderness which she attributes to a atorvastatin.  06/13/2016  Theresa Mccarty returns today for follow-up. Over the past year she's done fairly well. She's had some struggles with arthritis. She noted that she was having some side effects related to atorvastatin including weakness in her arm which improved after discontinuing the medicine. She says she's had about 8 pound weight loss however we demonstrated only 1-2 pounds in the office. She is currently on red yeast rice and wishes to have a fasting lipid profile today. She denies any significant chest discomfort or worsening shortness of breath. She recently retired from her job and notes less stress in her life. Blood pressure is well-controlled today.  04/20/2017  Theresa Mccarty was seen today in follow-up. Recently she was seen by Kerin Ransom and had symptoms of worsening fatigue and palpitations. She was found to be in A. fib with RVR. He adjusted some medications for rate control including adding diltiazem and started her on  Eliquis. She reports she's been taking Eliquis for the past several weeks however did miss at least one dose possibly 2. Today heart rate is just below 100 and her rhythm is irregular. She reports she feels better but still is fatigued. We discussed the possibility of a cardioversion attempt. She understands she would need at least 3 weeks of uninterrupted anticoagulation or TEE guided  approach in order to do that. She feels that she could be more compliant with medication.  06/16/2017  Theresa Mccarty returns today for follow-up. She underwent cardioversion which was unsuccessful. Subsequently she said that she does not think that she was terribly symptomatic therefore we proceeded rate controlled. I increased her diltiazem and heart rate is well-controlled in the 80s today. She reports no significant fatigue. She is switched from Eliquis to Xarelto due to the fact that she had trouble remembering the second dose of Eliquis. Blood pressure was elevated somewhat initially 154/95 however came down to 138/88. She also reports some abdominal bloating but his recent started on probiotics. She gets some lower extremity swelling but has some evidence of venous insufficiency. She is on hydrochlorothiazide with Diovan.  12/04/2017  Theresa Mccarty was seen today in follow-up.  She continues to be without complaints.  She denies any chest pain or worsening shortness of breath.  She remains in persistent if not permanent atrial fibrillation.  She has been on Xarelto which is been easier for her to remember however she is concerned about the cost, particularly now that she is in the donut hole is causing her about $500 a month.  Blood pressure is pretty well controlled.  She showed me numbers at home indicating blood pressures between 440 and 102 systolic.  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  05/21/2018  Theresa Mccarty was seen today in follow-up.  She denies chest pain or shortness of breath.  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  She is in persistent if not permanent atrial fibrillation.  Her family recently purchased a [place] mobile watch for her to monitor her a-fib and she has the ability to send EKGs to me.  Blood pressure is reasonably well controlled today.  We recently  discussed her lipid profile which in March showed an LDL of 84 with total cholesterol 183.  She has been taking ezetimibe but is statin intolerant.  Her goal LDL is less than 70 given her history of coronary artery disease and she may be a candidate for PCSK9 inhibitor therapy.  In the past she had intolerance to atorvastatin, but did not recall she had tried rosuvastatin.  09/03/2018  Laurren returns today for follow-up.  She is now getting up for furniture market next week.  She denies any chest pain or worsening shortness of breath.  She is noted to be in permanent A. fib with rate control.  She says she is tolerated changes in her medications including the recent addition of ezetimibe.  Recent lipid profile indicated total cholesterol 160, HDL 100 LDL 51 and triglycerides 46.  Blood pressure appears well-controlled and she is asymptomatic at this time.  08/13/2019  Shaunika seen today in follow-up.  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  She has stopped taking her statin medication.  She found  that she was having some chest discomfort that resolved.  She continues on ezetimibe.  Her labs have been well controlled but she is due for recheck lipid profile.  Blood pressure was elevated today 150/90.  She says at home is generally 110-130s.  She brought in cardia mobile strips which shows that she is in A. fib almost permanently.  02/17/2020  Dearia returns for follow-up.  She reports some occasional shortness of breath.  She says she still exercises regularly.  She is due for her first Covid vaccine in a few days.  She reports compliance with CPAP and has a follow-up appoint with Dr. Claiborne Billings in a few weeks.  She has been having some issues with going through the water reservoir very rapidly.  Blood pressure initially was elevated today however came down to 148/88.  She is overdue for lab  work.  PMHx:  Past Medical History:  Diagnosis Date  . Atrial fibrillation ([place])    a. initially diagnosed in 02/2017. Started on Eliquis  . Back pain, chronic   . Broken leg 1958  . CAD (coronary artery disease)    a. s/p DES to LAD in 2008, patent by cath in 2012.  [name] Hearing loss    bilateral hearing aids  . History of echocardiogram 09/11/2007   normal  . History of nuclear stress test 11/17/2010   exercise; small fixed anteroseptal defect (?artifact); short run of atrial-tachy during recovery; low risk  . Hyperlipidemia   . Hypertension   . Hypothyroid   . Impaired fasting glucose   . Osteoporosis   . Renal cell carcinoma right; 2011   Dr. Alinda Money, [facility]  . Sleep apnea    on CPAP    Past Surgical History:  Procedure Laterality Date  . ABDOMINAL HYSTERECTOMY  1975   PARTIAL; part of 1 ovary remains  . CARDIAC CATHETERIZATION  10/08/2011   LAD-prox stent patent; no significant obstructive CAD; hi LVF end-diastolic pressure  . CARDIOVERSION N/A 05/12/2017   Procedure: CARDIOVERSION;  Surgeon: [name], MD;  Location: [facility] ENDOSCOPY;  Service: Cardiovascular;  Laterality: N/A;  . CATARACT EXTRACTION, BILATERAL Bilateral 08/2019   Dr. [name]  . CORONARY ANGIOPLASTY WITH STENT PLACEMENT  2008   mid LAD  . IR GENERIC HISTORICAL  08/18/2016   IR RADIOLOGIST EVAL & MGMT 08/18/2016 [name], MD [facility] INTERV RAD  . IR RADIOLOGIST EVAL & MGMT  08/09/2018  . IR RADIOLOGIST EVAL & MGMT  09/19/2019  . KIDNEY SURGERY  2011   renal carcinoma - cryoablation -  Dr. Alinda Money and Dr. Laural Roes (cryoablation)  . SPINE SURGERY      FAMHx:  Family History  Problem Relation Age of Onset  . Heart disease Mother   . Hypertension Mother   . Arthritis Mother   . Osteoporosis Mother   . Depression Sister   . Hyperlipidemia Sister   . Heart disease Father   . Diabetes Brother   . Hyperlipidemia Son   . Cancer Sister        unsure, related to smoking?  . Cancer  Sister        lung cancer  . Hyperlipidemia Brother   . Hypertension Brother     SOCHx:   reports that she quit smoking about 51 years ago. She has never used smokeless tobacco. She reports current alcohol use of about 2.0 standard drinks of alcohol per week. She reports that she does not use drugs.  ALLERGIES:  Allergies  Allergen Reactions  .  Atorvastatin Other (See Comments)    Muscle aches  . Celebrex [Celecoxib] Other (See Comments)    Muscle aches  . Nitrofurantoin Nausea Only and Rash    ROS: Pertinent items noted in HPI and remainder of comprehensive ROS otherwise negative.  HOME MEDS: Current Outpatient Medications  Medication Sig Dispense Refill  . alendronate (FOSAMAX) 70 MG tablet Take 70 mg by mouth once a week. Take with a full glass of water on an empty stomach.    [name] aspirin EC 81 MG tablet Take 1 tablet (81 mg total) by mouth daily. 90 tablet 3  . B Complex Vitamins (B-COMPLEX/B-12 PO) Take 1 tablet by mouth daily.      . Calcium-Magnesium-Zinc 500-250-12.5 MG TABS Take 1 tablet by mouth daily.    . Cholecalciferol (VITAMIN D) 2000 units tablet Take 2,000 Units by mouth daily.    . Coenzyme Q10 (COQ10) 100 MG CAPS Take 1 tablet by mouth daily.     [name] diltiazem (CARDIZEM CD) 240 MG 24 hr capsule TAKE 1 CAPSULE BY MOUTH EVERY DAY 90 capsule 1  . ezetimibe (ZETIA) 10 MG tablet TAKE 1 TABLET BY MOUTH EVERY DAY 90 tablet 1  . levothyroxine (SYNTHROID) 50 MCG tablet TAKE 1 TABLET BY MOUTH EVERY DAY 90 tablet 1  . MILK THISTLE PO Take 1 capsule by mouth daily.    . nitroGLYCERIN (NITROSTAT) 0.4 MG SL tablet PLACE 1 TABLET (0.4 MG TOTAL) UNDER THE TONGUE EVERY 5 (FIVE) MINUTES AS NEEDED FOR CHEST PAIN. 25 tablet 4  . ofloxacin (OCUFLOX) 0.3 % ophthalmic solution PLEASE SEE ATTACHED FOR DETAILED DIRECTIONS    . Probiotic Product (PROBIOTIC PO) Take 1 capsule by mouth daily.    . valsartan-hydrochlorothiazide (DIOVAN-HCT) 320-25 MG tablet TAKE 1 TABLET BY MOUTH EVERY DAY 90  tablet 1  . XARELTO 20 MG TABS tablet TAKE 1 TABLET BY MOUTH DAILY WITH SUPPER 90 tablet 1   No current facility-administered medications for this visit.    LABS/IMAGING: No results found for this or any previous visit (from the past 48 hour(s)). No results found.  VITALS: BP (!) 163/89   Pulse 88   Temp (!) 97.3 F (36.3 C)   Ht 5\' 2"  (1.575 m)   Wt 167 lb 3.2 oz (75.8 kg)   SpO2 98%   BMI 30.58 kg/m   EXAM: General appearance: alert and no distress Neck: no carotid bruit and no JVD Lungs: clear to auscultation bilaterally Heart: irregularly irregular rhythm Abdomen: soft, non-tender; bowel sounds normal; no masses,  no organomegaly Extremities: extremities normal, atraumatic, no cyanosis or edema Pulses: 2+ and symmetric Skin: Skin color, texture, turgor normal. No rashes or lesions Neurologic: Grossly normal Psych: Pleasant  EKG: A. fib at 83-personally reviewed  ASSESSMENT: 1. Persistent atrial fibrillation with controlled ventricular response - failed cardioversion 2. CHADSVASC score of 4 on Xarelto 3. Coronary artery disease status post PCI of the proximal LAD in 2008 4. Hypertension - controlled 5. Hyperlipidemia -not at goal LDL less than 70 6. Statin intolerance 7. Obstructive sleep apnea on CPAP - 100% compliant 8. Arthritis  PLAN: 1.   Mrs. Beagan seems to be doing well denies any chest pain or worsening shortness of breath.  She is in A. fib but rate controlled.  She denies any bleeding issues on Xarelto.  Cholesterol was not quite at goal less than 70.  Will need to repeat labs including lipid profile, comprehensive metabolic profile and CBC as she is on a blood thinner.  She has been compliant with her CPAP and has follow-up with Dr. Claiborne Billings later this month.  Follow-up 6 months.  [name], MD, [facility], [place] Director of the Advanced Lipid Disorders &  Cardiovascular Risk Reduction Clinic Attending  Cardiologist  Direct Dial: [phone]  Fax: [phone]  Website:  [email or web address] 02/17/2020, 9:41 AM

## 2020-02-17 NOTE — Patient Instructions (Signed)
Medication Instructions:  Your physician recommends that you continue on your current medications as directed. Please refer to the Current Medication list given to you today.  *If you need a refill on your cardiac medications before your next appointment, please call your pharmacy*   Lab Work: CBC, CMET, Lipid Panel today   If you have labs (blood work) drawn today and your tests are completely normal, you will receive your results only by: [name] MyChart Message (if you have MyChart) OR . A paper copy in the mail If you have any lab test that is abnormal or we need to change your treatment, we will call you to review the results.   Testing/Procedures: NONE   Follow-Up: At [facility], you and your health needs are our priority.  As part of our continuing mission to provide you with exceptional heart care, we have created designated Provider Care Teams.  These Care Teams include your primary Cardiologist (physician) and Advanced Practice Providers (APPs -  Physician Assistants and Nurse Practitioners) who all work together to provide you with the care you need, when you need it.  We recommend signing up for the patient portal called "MyChart".  Sign up information is provided on this After Visit Summary.  MyChart is used to connect with patients for Virtual Visits (Telemedicine).  Patients are able to view lab/test results, encounter notes, upcoming appointments, etc.  Non-urgent messages can be sent to your provider as well.   To learn more about what you can do with MyChart, go to [email or web address].    Your next appointment:   6 month(s)  The format for your next appointment:   In Person  Provider:   You may see [name], MD or one of the following Advanced Practice Providers on your designated Care Team:    Almyra Deforest, PA-C  Fabian Sharp, PA-C or   Roby Lofts, [place]    Other Instructions

## 2020-02-19 ENCOUNTER — Other Ambulatory Visit: Payer: Self-pay | Admitting: Family Medicine

## 2020-02-21 ENCOUNTER — Other Ambulatory Visit: Payer: Self-pay | Admitting: Internal Medicine

## 2020-02-21 ENCOUNTER — Ambulatory Visit: Payer: Medicare Other | Attending: Internal Medicine

## 2020-02-21 DIAGNOSIS — Z23 Encounter for immunization: Secondary | ICD-10-CM

## 2020-02-21 NOTE — Progress Notes (Signed)
   Covid-19 Vaccination Clinic  Name:  SHYLYNN STORIE    MRN: [id number] DOB: Aug 07, 1941  02/21/2020  Ms. Zanardi was observed post Covid-19 immunization for 15 minutes without incident. She was provided with Vaccine Information Sheet and instruction to access the V-Safe system.   Ms. Barsanti was instructed to call 911 with any severe reactions post vaccine: [name] Difficulty breathing  . Swelling of face and throat  . A fast heartbeat  . A bad rash all over body  . Dizziness and weakness

## 2020-03-05 ENCOUNTER — Other Ambulatory Visit: Payer: Self-pay

## 2020-03-05 ENCOUNTER — Ambulatory Visit: Payer: Medicare Other | Admitting: Cardiovascular Disease

## 2020-03-05 ENCOUNTER — Encounter: Payer: Self-pay | Admitting: Cardiovascular Disease

## 2020-03-05 VITALS — BP 152/100 | HR 100 | Ht 62.0 in | Wt 169.0 lb

## 2020-03-05 DIAGNOSIS — Z9989 Dependence on other enabling machines and devices: Secondary | ICD-10-CM

## 2020-03-05 DIAGNOSIS — Z7901 Long term (current) use of anticoagulants: Secondary | ICD-10-CM | POA: Diagnosis not present

## 2020-03-05 DIAGNOSIS — I2510 Atherosclerotic heart disease of native coronary artery without angina pectoris: Secondary | ICD-10-CM

## 2020-03-05 DIAGNOSIS — Z9861 Coronary angioplasty status: Secondary | ICD-10-CM

## 2020-03-05 DIAGNOSIS — E782 Mixed hyperlipidemia: Secondary | ICD-10-CM

## 2020-03-05 DIAGNOSIS — I4819 Other persistent atrial fibrillation: Secondary | ICD-10-CM | POA: Diagnosis not present

## 2020-03-05 DIAGNOSIS — I10 Essential (primary) hypertension: Secondary | ICD-10-CM | POA: Diagnosis not present

## 2020-03-05 DIAGNOSIS — G4733 Obstructive sleep apnea (adult) (pediatric): Secondary | ICD-10-CM

## 2020-03-05 MED ORDER — METOPROLOL SUCCINATE ER 25 MG PO TB24: ORAL_TABLET | ORAL | 3 refills | Status: DC

## 2020-03-05 NOTE — Patient Instructions (Signed)
Medication Instructions:  BEGIN TAKING METOPROLOL SUCCINATE-- BEING WITH 12.5 MG (1/2 TABLET DAILY) FOR 1-2 WEEKS.  IF YOU ARE STILL HAVING INCREASED BLOOD PRESSURE AND HEART RATE YOU MAY TAKE 25MG  (1 TABLET DAILY)   *If you need a refill on your cardiac medications before your next appointment, please call your pharmacy*   Follow-Up: At [facility], you and your health needs are our priority.  As part of our continuing mission to provide you with exceptional heart care, we have created designated Provider Care Teams.  These Care Teams include your primary Cardiologist (physician) and Advanced Practice Providers (APPs -  Physician Assistants and Nurse Practitioners) who all work together to provide you with the care you need, when you need it.  We recommend signing up for the patient portal called "MyChart".  Sign up information is provided on this After Visit Summary.  MyChart is used to connect with patients for Virtual Visits (Telemedicine).  Patients are able to view lab/test results, encounter notes, upcoming appointments, etc.  Non-urgent messages can be sent to your provider as well.   To learn more about what you can do with MyChart, go to [email or web address].    Your next appointment:   12 month(s)  The format for your next appointment:   In Person  Provider:   [name], MD

## 2020-03-06 ENCOUNTER — Ambulatory Visit: Payer: Medicare Other

## 2020-03-06 ENCOUNTER — Other Ambulatory Visit: Payer: Medicare Other

## 2020-03-07 ENCOUNTER — Encounter: Payer: Self-pay | Admitting: Cardiovascular Disease

## 2020-03-07 NOTE — Progress Notes (Signed)
Cardiology Office Note    Date:  03/07/2020   ID:  Theresa Mccarty, DOB Nov 05, 1941, MRN [id number]  PCP:  [name], MD  Cardiologist:  [name], MD   Reestablishment of care in a sleep clinic evaluation  History of Present Illness:  Theresa Mccarty is a 79 y.o. female who is followed by [name] for her cardiology care.  She has a history of hypertension, hyperlipidemia, CAD, as well as obstructive sleep apnea.  She recently had a replacement CPAP and presents for sleep clinic evaluation.  Theresa Mccarty has a history of sleep apnea dating back to 2009 when a polysomnogram revealed mild to moderate obstructive sleep apnea overall with an AHI of 14.4/h; however during REM sleep, sleep apnea was severe with AHI 41.7/h.  She had significant oxygen desaturation to a nadir of 83%.  She has been on CPAP therapy since that time and did well until 2015 when her CPAP machine began to malfunction.  She received a new ResMed air sense 10 AutoSet CPAP unit in early 2015 and I saw her in March 2015 for her compliance assessment at which time usage was 100% and noted marked benefit with her new machine compared to her old one.  At that time there was no residual daytime sleepiness.  I have not seen Theresa Mccarty since 2015.  She has been followed by [name].  She has had significant issues during this COVID-19 pandemic.  At the end of 2020, apparently her machine was setting a signal of expiration.  She subsequently received a new replacement CPAP ResMed air sense 10 unit with set up date on December 10, 2019.  She has been using her new machine since and continues to be 100% compliant.  A download was obtained in the office today from February 04, 2020 through March 04, 2020 which shows average use of 10 hours and 2 minutes per night.  At her 12 cm set pressure, AHI is excellent at 0.6.  She has had difficulty with humidification and oftentimes notes water in her mask and tube.  With her new machine she denied  residual daytime sleepiness.  An Epworth Sleepiness Scale score was calculated in the office today and this endorsed at 0.  She has history of hypertension and has been on diltiazem 240 mg daily in addition to valsartan HCT 320/25 mg.  There is a history of atrial fibrillation which is persistent for which she is on anticoagulation therapy with Xarelto.  She has hypothyroidism on levothyroxine and is on Zetia for hyperlipidemia.  She presents for evaluation.   Past Medical History:  Diagnosis Date  . Atrial fibrillation ([place])    a. initially diagnosed in 02/2017. Started on Eliquis  . Back pain, chronic   . Broken leg 1958  . CAD (coronary artery disease)    a. s/p DES to LAD in 2008, patent by cath in 2012.  [name] Hearing loss    bilateral hearing aids  . History of echocardiogram 09/11/2007   normal  . History of nuclear stress test 11/17/2010   exercise; small fixed anteroseptal defect (?artifact); short run of atrial-tachy during recovery; low risk  . Hyperlipidemia   . Hypertension   . Hypothyroid   . Impaired fasting glucose   . Osteoporosis   . Renal cell carcinoma right; 2011   Dr. Alinda Money, [facility]  . Sleep apnea    on CPAP    Past Surgical History:  Procedure Laterality Date  . ABDOMINAL  HYSTERECTOMY  1975   PARTIAL; part of 1 ovary remains  . CARDIAC CATHETERIZATION  10/08/2011   LAD-prox stent patent; no significant obstructive CAD; hi LVF end-diastolic pressure  . CARDIOVERSION N/A 05/12/2017   Procedure: CARDIOVERSION;  Surgeon: [name], MD;  Location: [facility] ENDOSCOPY;  Service: Cardiovascular;  Laterality: N/A;  . CATARACT EXTRACTION, BILATERAL Bilateral 08/2019   Dr. [name]  . CORONARY ANGIOPLASTY WITH STENT PLACEMENT  2008   mid LAD  . IR GENERIC HISTORICAL  08/18/2016   IR RADIOLOGIST EVAL & MGMT 08/18/2016 [name], MD [facility] INTERV RAD  . IR RADIOLOGIST EVAL & MGMT  08/09/2018  . IR RADIOLOGIST EVAL & MGMT  09/19/2019  . KIDNEY SURGERY  2011    renal carcinoma - cryoablation -  Dr. Alinda Money and Dr. Laural Roes (cryoablation)  . SPINE SURGERY      Current Medications: Outpatient Medications Prior to Visit  Medication Sig Dispense Refill  . alendronate (FOSAMAX) 70 MG tablet TAKE 1 TABLET BY MOUTH EVERY 7 DAYS. TAKE WITH A FULL GLASS OF WATER ON AN EMPTY STOMACH. 12 tablet 3  . aspirin EC 81 MG tablet Take 1 tablet (81 mg total) by mouth daily. 90 tablet 3  . B Complex Vitamins (B-COMPLEX/B-12 PO) Take 1 tablet by mouth daily.      . Calcium-Magnesium-Zinc 500-250-12.5 MG TABS Take 1 tablet by mouth daily.    . Cholecalciferol (VITAMIN D) 2000 units tablet Take 2,000 Units by mouth daily.    . Coenzyme Q10 (COQ10) 100 MG CAPS Take 1 tablet by mouth daily.     [name] diltiazem (CARDIZEM CD) 240 MG 24 hr capsule TAKE 1 CAPSULE BY MOUTH EVERY DAY 90 capsule 1  . ezetimibe (ZETIA) 10 MG tablet TAKE 1 TABLET BY MOUTH EVERY DAY 90 tablet 1  . levothyroxine (SYNTHROID) 50 MCG tablet TAKE 1 TABLET BY MOUTH EVERY DAY 90 tablet 1  . MILK THISTLE PO Take 1 capsule by mouth daily.    . nitroGLYCERIN (NITROSTAT) 0.4 MG SL tablet PLACE 1 TABLET (0.4 MG TOTAL) UNDER THE TONGUE EVERY 5 (FIVE) MINUTES AS NEEDED FOR CHEST PAIN. 25 tablet 4  . ofloxacin (OCUFLOX) 0.3 % ophthalmic solution PLEASE SEE ATTACHED FOR DETAILED DIRECTIONS    . Probiotic Product (PROBIOTIC PO) Take 1 capsule by mouth daily.    . valsartan-hydrochlorothiazide (DIOVAN-HCT) 320-25 MG tablet TAKE 1 TABLET BY MOUTH EVERY DAY 90 tablet 3  . XARELTO 20 MG TABS tablet TAKE 1 TABLET BY MOUTH DAILY WITH SUPPER 90 tablet 1   No facility-administered medications prior to visit.     Allergies:   Atorvastatin, Celebrex [celecoxib], and Nitrofurantoin   Social History   Socioeconomic History  . Marital status: Married    Spouse name: Not on file  . Number of children: 2  . Years of education: Not on file  . Highest education level: Not on file  Occupational History  . Occupation:  retired Teacher, music: [employer]  Tobacco Use  . Smoking status: Former Smoker    Quit date: 12/19/1968    Years since quitting: 51.2  . Smokeless tobacco: Never Used  Substance and Sexual Activity  . Alcohol use: Yes    Alcohol/week: 2.0 standard drinks    Types: 2 Glasses of wine per week    Comment: 1-3 glasses of wine 5/week (occ martini)  . Drug use: No  . Sexual activity: Not Currently  Other Topics Concern  . Not on file  Social  History Narrative   Lives with husband.  2 sons live near [place]. Retired from working for [employer] 02/2016. 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   Updated 11/2019   Social Determinants of Health   Financial Resource Strain:   . Difficulty of Paying Living Expenses:   Food Insecurity:   . Worried About Charity fundraiser in the Last Year:   . Arboriculturist in the Last Year:   Transportation Needs:   . Film/video editor (Medical):   [name] Lack of Transportation (Non-Medical):   Physical Activity:   . Days of Exercise per Week:   . Minutes of Exercise per Session:   Stress:   . Feeling of Stress :   Social Connections:   . Frequency of Communication with Friends and Family:   . Frequency of Social Gatherings with Friends and Family:   . Attends Religious Services:   . Active Member of Clubs or Organizations:   . Attends Archivist Meetings:   [name] Marital Status:      Family History:  The patient's  'family history includes Arthritis in her mother; Cancer in her sister and sister; Depression in her sister; Diabetes in her brother; Heart disease in her father and mother; Hyperlipidemia in her brother, sister, and son; Hypertension in her brother  and mother; Osteoporosis in her mother.   ROS General: Negative; No fevers, chills, or night sweats;  HEENT: Negative; No changes in vision or hearing, sinus congestion, difficulty swallowing Pulmonary: Negative; No cough, wheezing, shortness of breath, hemoptysis Cardiovascular: Positive for atrial fibrillation, hypertension, hyperlipidemia, and CAD. GI: Negative; No nausea, vomiting, diarrhea, or abdominal pain GU: Negative; No dysuria, hematuria, or difficulty voiding Musculoskeletal: Negative; no myalgias, joint pain, or weakness Hematologic/Oncology: Negative; no easy bruising, bleeding Endocrine: Negative; no heat/cold intolerance; no diabetes Neuro: Negative; no changes in balance, headaches Skin: Negative; No rashes or skin lesions Psychiatric: Negative; No behavioral problems, depression Sleep: OSA on CPAP therapy since 2009.  No snoring, daytime sleepiness, hypersomnolence, bruxism, restless legs, hypnogognic hallucinations, no cataplexy Other comprehensive 14 point system review is negative.   PHYSICAL EXAM:   VS:  BP (!) 152/100   Pulse 100   Ht '5\' 2"'  (1.575 m)   Wt 169 lb (76.7 kg)   SpO2 96%   BMI 30.91 kg/m     Repeat blood pressure by me 142/88  Wt Readings from Last 3 Encounters:  03/05/20 169 lb (76.7 kg)  02/17/20 167 lb 3.2 oz (75.8 kg)  12/09/19 165 lb 6.4 oz (75 kg)    General: Alert, oriented, no distress.  Skin: normal turgor, no rashes, warm and dry HEENT: Normocephalic, atraumatic. Pupils equal round and reactive to light; sclera anicteric; extraocular muscles intact; Nose without nasal septal hypertrophy Mouth/Parynx benign; Mallinpatti scale 3 Neck: No JVD, no carotid bruits; normal carotid upstroke Lungs: clear to ausculatation and percussion; no wheezing or rales Chest wall: without tenderness to palpitation Heart: PMI not displaced, irregularly irregular with a ventricular rate in the 90s, s1 s2 normal, 1/6 systolic murmur, no diastolic  murmur, no rubs, gallops, thrills, or heaves Abdomen: soft, nontender; no hepatosplenomehaly, BS+; abdominal aorta nontender and not dilated by palpation.  Back: no CVA tenderness Pulses 2+ Musculoskeletal: full range of motion, normal strength, no joint deformities Extremities: no clubbing cyanosis or edema, Homan's sign negative  Neurologic: grossly nonfocal; Cranial nerves grossly wnl Psychologic: Normal mood and affect   Studies/Labs Reviewed:   EKG:  EKG is not ordered today.  I personally reviewed the EKG from February 17, 2020 which shows atrial fibrillation at 83 bpm.  QTc interval 437 ms.  Recent Labs: BMP Latest Ref Rng & Units 02/17/2020 12/09/2019 09/17/2019  Glucose 65 - 99 mg/dL 90 102(H) -  BUN 8 - 27 mg/dL 14 14 -  Creatinine 0.57 - 1.00 mg/dL 0.74 0.83 0.80  BUN/Creat Ratio 12 - '28 19 17 ' -  Sodium 134 - 144 mmol/L 133(L) 133(L) -  Potassium 3.5 - 5.2 mmol/L 4.1 4.0 -  Chloride 96 - 106 mmol/L 95(L) 92(L) -  CO2 20 - 29 mmol/L 24 24 -  Calcium 8.7 - 10.3 mg/dL 9.6 9.7 -     Hepatic Function Latest Ref Rng & Units 02/17/2020 12/09/2019 12/05/2018  Total Protein 6.0 - 8.5 g/dL 6.7 7.0 7.0  Albumin 3.7 - 4.7 g/dL 4.3 4.6 4.6  AST 0 - 40 IU/L '20 18 22  ' ALT 0 - 32 IU/L '16 19 23  ' Alk Phosphatase 39 - 117 IU/L 63 68 70  Total Bilirubin 0.0 - 1.2 mg/dL 0.5 0.5 0.6    CBC Latest Ref Rng & Units 02/17/2020 12/09/2019 12/05/2018  WBC 3.4 - 10.8 x10E3/uL 5.5 5.9 5.2  Hemoglobin 11.1 - 15.9 g/dL 13.9 15.0 13.7  Hematocrit 34.0 - 46.6 % 39.4 43.3 38.9  Platelets 150 - 450 x10E3/uL 254 289 276   Lab Results  Component Value Date   MCV 94 02/17/2020   MCV 96 12/09/2019   MCV 94 12/05/2018   Lab Results  Component Value Date   TSH 2.640 08/13/2019   Lab Results  Component Value Date   HGBA1C 5.1 11/30/2017     BNP No results found for: BNP  ProBNP    Component Value Date/Time   PROBNP 263.8 (H) 10/09/2011 0333     Lipid Panel     Component Value Date/Time    CHOL 182 02/17/2020 1044   CHOL 168 07/09/2013 0805   TRIG 49 02/17/2020 1044   TRIG 46 07/09/2013 0805   HDL 95 02/17/2020 1044   HDL 78 07/09/2013 0805   CHOLHDL 1.9 02/17/2020 1044   CHOLHDL 2.0 11/30/2017 1523   VLDL 11 11/28/2016 1359   LDLCALC 77 02/17/2020 1044   LDLCALC 95 11/30/2017 1523   LDLCALC 81 07/09/2013 0805   LABVLDL 10 02/17/2020 1044     RADIOLOGY: No results found.   Additional studies/ records that were reviewed today include:  I reviewed the patient's initial sleep studies which were done at the [facility] in 2009, reviewed her office note by me in 2015, recent records by [name], and obtained a new download in the office today.  ASSESSMENT:    1. OSA on CPAP   2. Persistent atrial fibrillation ([place])   3. Essential hypertension   4. On anticoagulant therapy   5. CAD S/P percutaneous coronary angioplasty   6. Mixed hyperlipidemia     PLAN:  Theresa Mccarty is a 79 year old female who has significant cardiovascular comorbidities including CAD status post LAD stenting, hypertension, hyperlipidemia, permanent atrial fibrillation on anticoagulation, as well as obstructive sleep apnea originally diagnosed in 2009.  Over the years she admits to good  compliance.  She received a new ResMed air sense 10 CPAP unit which is her third machine and her most recent set up date was December 10, 2019.  Her download demonstrates excellent compliance and sleep duration.  At a 12 cm set pressure, AHI is excellent at 0.6/h.  She is unaware of any breakthrough snoring.  Her sleep is restorative.  There is no residual daytime sleepiness.  She denies any bruxism or restless legs.  Her blood pressure today was mildly elevated and she has been on diltiazem 240 mg daily in addition to valsartan HCT 320/25 mg.  I discussed with her new hypertensive guidelines.  She will monitor her blood pressure at home closely with plans for follow-up with [name] and may  require additional medical therapy to achieve optimal blood pressure control.  Her atrial fibrillation rate is controlled and she continues to be on Xarelto.  She is on levothyroxine 50 mcg for hypothyroidism and is on Zetia 10 mg for high hyperlipidemia.  From a sleep perspective she is doing well.  Per Medicare guidelines I will see her in 1 year for follow-up evaluation.   Medication Adjustments/Labs and Tests Ordered: Current medicines are reviewed at length with the patient today.  Concerns regarding medicines are outlined above.  Medication changes, Labs and Tests ordered today are listed in the Patient Instructions below.   Patient Instructions  Medication Instructions:  BEGIN TAKING METOPROLOL SUCCINATE-- BEING WITH 12.5 MG (1/2 TABLET DAILY) FOR 1-2 WEEKS.  IF YOU ARE STILL HAVING INCREASED BLOOD PRESSURE AND HEART RATE YOU MAY TAKE 25MG (1 TABLET DAILY)   *If you need a refill on your cardiac medications before your next appointment, please call your pharmacy*   Follow-Up: At [facility], you and your health needs are our priority.  As part of our continuing mission to provide you with exceptional heart care, we have created designated Provider Care Teams.  These Care Teams include your primary Cardiologist (physician) and Advanced Practice Providers (APPs -  Physician Assistants and Nurse Practitioners) who all work together to provide you with the care you need, when you need it.  We recommend signing up for the patient portal called "MyChart".  Sign up information is provided on this After Visit Summary.  MyChart is used to connect with patients for Virtual Visits (Telemedicine).  Patients are able to view lab/test results, encounter notes, upcoming appointments, etc.  Non-urgent messages can be sent to your provider as well.   To learn more about what you can do with MyChart, go to [email or web address].    Your next appointment:   12 month(s)  The format for your next  appointment:   In Person  Provider:   [name], MD      Signed, [name], MD  03/07/2020 3:56 PM    [facility] [address] [place], [place], [place]  [address] Phone: [phone]

## 2020-03-09 DIAGNOSIS — G4733 Obstructive sleep apnea (adult) (pediatric): Secondary | ICD-10-CM | POA: Diagnosis not present

## 2020-03-12 ENCOUNTER — Encounter: Payer: Self-pay | Admitting: Family Medicine

## 2020-03-16 ENCOUNTER — Encounter: Payer: Self-pay | Admitting: Family Medicine

## 2020-03-18 ENCOUNTER — Ambulatory Visit: Payer: Medicare Other | Attending: Internal Medicine

## 2020-03-18 DIAGNOSIS — Z23 Encounter for immunization: Secondary | ICD-10-CM

## 2020-03-18 NOTE — Progress Notes (Signed)
   Covid-19 Vaccination Clinic  Name:  Theresa Mccarty    MRN: [id number] DOB: 27-Jul-1941  03/18/2020  Theresa Mccarty was observed post Covid-19 immunization for 15 minutes without incident. She was provided with Vaccine Information Sheet and instruction to access the V-Safe system.   Theresa Mccarty was instructed to call 911 with any severe reactions post vaccine: [name] Difficulty breathing  . Swelling of face and throat  . A fast heartbeat  . A bad rash all over body  . Dizziness and weakness   Immunizations Administered    Name Date Dose VIS Date Route   Pfizer COVID-19 Vaccine 03/18/2020 10:25 AM 0.3 mL 11/29/2019 Intramuscular   Manufacturer: [place]   Lot: [id number]   [place]: [id number]

## 2020-03-20 ENCOUNTER — Other Ambulatory Visit: Payer: Self-pay | Admitting: Internal Medicine

## 2020-03-26 ENCOUNTER — Other Ambulatory Visit: Payer: Self-pay | Admitting: Internal Medicine

## 2020-04-08 DIAGNOSIS — G4733 Obstructive sleep apnea (adult) (pediatric): Secondary | ICD-10-CM | POA: Diagnosis not present

## 2020-04-09 DIAGNOSIS — G4733 Obstructive sleep apnea (adult) (pediatric): Secondary | ICD-10-CM | POA: Diagnosis not present

## 2020-04-16 ENCOUNTER — Other Ambulatory Visit: Payer: Self-pay | Admitting: Gastroenterology

## 2020-04-16 DIAGNOSIS — K862 Cyst of pancreas: Secondary | ICD-10-CM

## 2020-04-20 ENCOUNTER — Other Ambulatory Visit: Payer: Self-pay | Admitting: Family Medicine

## 2020-04-20 DIAGNOSIS — E039 Hypothyroidism, unspecified: Secondary | ICD-10-CM

## 2020-05-09 DIAGNOSIS — G4733 Obstructive sleep apnea (adult) (pediatric): Secondary | ICD-10-CM | POA: Diagnosis not present

## 2020-05-11 ENCOUNTER — Other Ambulatory Visit: Payer: Self-pay

## 2020-05-11 ENCOUNTER — Ambulatory Visit
Admission: RE | Admit: 2020-05-11 | Discharge: 2020-05-11 | Disposition: A | Discharge status: Home / Self Care | Payer: Medicare Other | Source: Ambulatory Visit | Attending: Family Medicine | Admitting: Family Medicine

## 2020-05-11 DIAGNOSIS — Z1231 Encounter for screening mammogram for malignant neoplasm of breast: Secondary | ICD-10-CM

## 2020-05-11 DIAGNOSIS — M810 Age-related osteoporosis without current pathological fracture: Secondary | ICD-10-CM

## 2020-05-11 DIAGNOSIS — M8589 Other specified disorders of bone density and structure, multiple sites: Secondary | ICD-10-CM | POA: Diagnosis not present

## 2020-05-11 DIAGNOSIS — Z780 Asymptomatic menopausal state: Secondary | ICD-10-CM | POA: Diagnosis not present

## 2020-05-11 DIAGNOSIS — E2839 Other primary ovarian failure: Secondary | ICD-10-CM

## 2020-05-12 ENCOUNTER — Telehealth: Payer: Self-pay

## 2020-05-12 NOTE — Telephone Encounter (Signed)
[facility] called with a message from the radiologist concerning the Dexa scan . Apparently there was a question asked about abnormal dexa scans. The answer to the question is, patient with diagnosis of osteoporosis that are at high risk for fracture should have a regular scan done every 2 years and can have one done at the 1 year mark if they have a rapid progressing disease.

## 2020-05-13 ENCOUNTER — Other Ambulatory Visit: Payer: Self-pay

## 2020-05-13 ENCOUNTER — Ambulatory Visit
Admission: RE | Admit: 2020-05-13 | Discharge: 2020-05-13 | Disposition: A | Discharge status: Home / Self Care | Payer: Medicare Other | Source: Ambulatory Visit | Attending: Gastroenterology | Admitting: Gastroenterology

## 2020-05-13 DIAGNOSIS — K862 Cyst of pancreas: Secondary | ICD-10-CM

## 2020-05-13 MED ORDER — GADOBENATE DIMEGLUMINE 529 MG/ML IV SOLN
15.0000 mL | Freq: Once | INTRAVENOUS | Status: AC | PRN
  Administered 2020-05-13: 15 mL via INTRAVENOUS

## 2020-05-16 ENCOUNTER — Other Ambulatory Visit: Payer: Self-pay | Admitting: Internal Medicine

## 2020-05-16 DIAGNOSIS — E785 Hyperlipidemia, unspecified: Secondary | ICD-10-CM

## 2020-05-25 ENCOUNTER — Telehealth: Payer: Self-pay | Admitting: Internal Medicine

## 2020-05-25 NOTE — Telephone Encounter (Signed)
Pt c/o BP issue: STAT if pt c/o blurred vision, one-sided weakness or slurred speech  1. What are your last 5 BP readings?   182/112  2. Are you having any other symptoms (ex. Dizziness, headache, blurred vision, passed out)? Light headaches  3. What is your BP issue? Patient states BP has been high and fluctuating.

## 2020-05-25 NOTE — Telephone Encounter (Signed)
Pt called to report that she has been having some high BP readings for 2 days.. she has been under very high anxiety.. her son in [place] has cardiac arrest last week and still in the hospital and her husband had a stent put in last week.   She says her BP has been good 129/77, 109/72 but since yesterday it has been 182/112 pre meds, then after recheck: 172/103 HR 78 158/89 HR 84 164/88 HR 82  She denies headache, dizziness, Sob, some pain ion her left breast but she says this is not new for her.   I ask her to try and relax.. they will be leaving for [facility] soon.   I will forward to [name] for review.

## 2020-05-25 NOTE — Telephone Encounter (Signed)
Spoke with the pt and she verbalized understanding of Dr. Lysbeth Penner recommendations and will continue to monitor and call back if she has any further problems.

## 2020-05-25 NOTE — Telephone Encounter (Signed)
So sorry to hear that! I'm sure it's stress - she could take the full tablet of metoprolol for a few days to help with BP and calm the nerves a bit.  Dr Lemmie Evens

## 2020-06-05 ENCOUNTER — Telehealth: Payer: Self-pay | Admitting: Internal Medicine

## 2020-06-05 NOTE — Telephone Encounter (Signed)
Agree with advice and plan

## 2020-06-05 NOTE — Telephone Encounter (Signed)
New Message  Pt c/o of Chest Pain: STAT if CP now or developed within 24 hours  1. Are you having CP right now? No  2. Are you experiencing any other symptoms (ex. SOB, nausea, vomiting, sweating)? Chest pain, SOB (afib), tingling in arms, elevated BP/HR.  3. How long have you been experiencing CP? 2 weeks, under a lot of stress  4. Is your CP continuous or coming and going? Coming and going   5. Have you taken Nitroglycerin? No ?

## 2020-06-05 NOTE — Telephone Encounter (Signed)
Returned call to patient of [name]. She c/o chest discomfort ("hurting", little bit of pressure", soreness under arms/around breasts), SOB (afib), tingling in arms, and elevated BP. She has been under a lot of stress recently with her son having cardiac arrest in [place] and her husband getting heart cath/stent @ [facility] the same day - both are recovering and doing better. She reports some chest discomfort off and on since she saw [name] at last OV and has just been monitoring. She states symptoms have worsened due to extreme stress. She called in last week with elevated BP - states BP was in 160s-170s while in [place] with son. Her BP is better now - 148/80s, 128/73 (HR 53), 137/72, 128/78, 139/83 (HR 87), 117/83 (HR 85), 130/75 (HR 77).   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   Patient is able to exercise on treadmill without difficulty. If she has a strenuous work day which requires her to stand on her feet longer, may be harder to exercise. She reports SOB when walking up and down hills and if she is bending down while cleaning - she attributes this to afib.  She is asking for a sooner appointment for evaluation given ongoing symptoms - scheduled her for OV with Denyse Amass NP on 6/24  Will notify MD of patient's concerns and contact her if there are recommendations or if any testing is warranted based on symptoms.

## 2020-06-05 NOTE — Telephone Encounter (Signed)
Spoke with patient, advised no additional recommendations at this time. Keep appointment as scheduled

## 2020-06-09 DIAGNOSIS — G4733 Obstructive sleep apnea (adult) (pediatric): Secondary | ICD-10-CM | POA: Diagnosis not present

## 2020-06-10 NOTE — Progress Notes (Signed)
Cardiology Clinic Note   Patient Name: Theresa Mccarty Date of Encounter: 06/11/2020  Primary Care Provider:  [name], MD Primary Cardiologist:  [name], MD  Patient Profile    Theresa Mccarty 79 year old female presents today for evaluation of her chest pressure and shortness of breath.  Past Medical History    Past Medical History:  Diagnosis Date  . Atrial fibrillation ([place])    a. initially diagnosed in 02/2017. Started on Eliquis  . Back pain, chronic   . Broken leg 1958  . CAD (coronary artery disease)    a. s/p DES to LAD in 2008, patent by cath in 2012.  [name] Hearing loss    bilateral hearing aids  . History of echocardiogram 09/11/2007   normal  . History of nuclear stress test 11/17/2010   exercise; small fixed anteroseptal defect (?artifact); short run of atrial-tachy during recovery; low risk  . Hyperlipidemia   . Hypertension   . Hypothyroid   . Impaired fasting glucose   . Osteoporosis   . Renal cell carcinoma right; 2011   Dr. Alinda Money, [facility]  . Sleep apnea    on CPAP   Past Surgical History:  Procedure Laterality Date  . ABDOMINAL HYSTERECTOMY  1975   PARTIAL; part of 1 ovary remains  . CARDIAC CATHETERIZATION  10/08/2011   LAD-prox stent patent; no significant obstructive CAD; hi LVF end-diastolic pressure  . CARDIOVERSION N/A 05/12/2017   Procedure: CARDIOVERSION;  Surgeon: [name], MD;  Location: [facility] ENDOSCOPY;  Service: Cardiovascular;  Laterality: N/A;  . CATARACT EXTRACTION, BILATERAL Bilateral 08/2019   Dr. [name]  . COLONOSCOPY  03/21/2006   polyps (hyperplastic polyps), diverticula, internal hemorrhoids; Dr. Benson Norway  . COLONOSCOPY  07/26/2011   diverticula, medium hemorrhoids; no rec f/u unless sx  . CORONARY ANGIOPLASTY WITH STENT PLACEMENT  2008   mid LAD  . IR GENERIC HISTORICAL  08/18/2016   IR RADIOLOGIST EVAL & MGMT 08/18/2016 [name], MD [facility] INTERV RAD  . IR RADIOLOGIST EVAL & MGMT  08/09/2018  . IR  RADIOLOGIST EVAL & MGMT  09/19/2019  . KIDNEY SURGERY  2011   renal carcinoma - cryoablation -  Dr. Alinda Money and Dr. Laural Roes (cryoablation)  . SPINE SURGERY      Allergies  Allergies  Allergen Reactions  . Atorvastatin Other (See Comments)    Muscle aches  . Celebrex [Celecoxib] Other (See Comments)    Muscle aches  . Nitrofurantoin Nausea Only and Rash    History of Present Illness    Theresa Mccarty is a PMH of atrial fibrillation, HTN, HLD, CAD (patent LAD stent proximal 30% stenosis on last cath), OSA on CPAP.  She also has a history of unsuccessful DCCV.  She was last seen by [name] on 02/17/2020.  It is noted that after her cardiac catheterization she continued to have substernal chest pain and was felt to be most likely related to stress given her nonobstructive CAD on cardiac catheterization.  She had been previously instructed to make dietary changes and reduce her stress and alcohol intake.  She was somewhat compliant with these requests which provided symptomatic relief.  She has indicated that she has occasional periods of DOE with heavy exertion.  She also has some swelling in her legs with standing for long periods of time.  During her last visit she indicated that she continued to exercise regularly.  She had scheduled her first Covid vaccination, reported compliance with her CPAP.  Her blood  pressure was initially elevated and then decreased on recheck.  She contacted the nurse triage line on 06/05/2020 with symptoms of intermittent chest discomfort described as (a little bit of pressure), soreness under and around both breasts, and shortness of breath with tingling under her armpits.  She also reported elevated blood pressures in the 810F-751W systolic.  Her son and husband both had cardiac trouble on the same day.  Her son with a recent cardiac arrest in [place] and her husband receiving a cardiac catheterization with stent placement on the same day.  Her blood pressures have  since returned back to normal in the 120s-130s over 70s and 80s.  She requested a sooner follow-up visit.  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  She also indicated that she was able to continue to exercise on her treadmill without difficulty.  She presents to the clinic today and states she has been under a great deal of stress due to her husband needing coronary stenting and her son having cardiac arrest and needing to be cooled in the ICU.  She states that she notices chest pain across her left axillary region and bilateral mid chest/pectoral regions.  She continues to be physically active walking 1.75-2 miles most days of the week.  She said she has had some dietary indiscretion related to stress eating.  Chest pain reproduced with deep palpation.  I will have her follow a heart healthy low-sodium diet, rest her chest area, and follow-up with [name] in 3 months.  Mindfulness stress reduction sheet given  Today she denies shortness of breath, lower extremity edema, fatigue, palpitations, melena, hematuria, hemoptysis, diaphoresis, weakness, presyncope, syncope, orthopnea, and PND.   Home Medications    Prior to Admission medications   Medication Sig Start Date End Date Taking? Authorizing Provider  alendronate (FOSAMAX) 70 MG tablet TAKE 1 TABLET BY MOUTH EVERY 7 DAYS. TAKE WITH A FULL GLASS OF WATER ON AN EMPTY STOMACH. 02/19/20   [name], MD  aspirin EC 81 MG tablet Take 1 tablet (81 mg total) by mouth daily. 04/20/17   Hilty, Nadean Corwin, MD  B Complex Vitamins (B-COMPLEX/B-12 PO) Take 1 tablet by mouth daily.      [provider]  Calcium-Magnesium-Zinc 500-250-12.5 MG TABS Take 1 tablet by mouth daily.    [provider]  Cholecalciferol (VITAMIN D) 2000 units tablet Take 2,000 Units by mouth daily.    [provider]  Coenzyme Q10 (COQ10) 100 MG CAPS Take 1 tablet  by mouth daily.     [provider]  diltiazem (CARDIZEM CD) 240 MG 24 hr capsule TAKE 1 CAPSULE BY MOUTH EVERY DAY 03/20/20   Hilty, Nadean Corwin, MD  ezetimibe (ZETIA) 10 MG tablet TAKE 1 TABLET BY MOUTH EVERY DAY 05/18/20   Hilty, Nadean Corwin, MD  levothyroxine (SYNTHROID) 50 MCG tablet TAKE 1 TABLET BY MOUTH EVERY DAY 04/20/20   [name], MD  metoprolol succinate (TOPROL-XL) 25 MG 24 hr tablet TAKE 12.5MG  (1/2 TABLET DAILY) FOR 1-2 WEEKS IF YOU ARE STILL HAVING INCREASED HEART RATE AND BLOOD PRESSURE, YOU MAY TAKE 25MG  (1 WHOLE TABLET DAILY) 03/05/20   Troy Sine, MD  MILK THISTLE PO Take 1 capsule by mouth daily.    [provider]  nitroGLYCERIN (NITROSTAT) 0.4 MG SL tablet PLACE 1 TABLET (0.4 MG TOTAL) UNDER THE TONGUE EVERY 5 (FIVE) MINUTES  AS NEEDED FOR CHEST PAIN. 01/02/19   Hilty, Nadean Corwin, MD  ofloxacin (OCUFLOX) 0.3 % ophthalmic solution PLEASE SEE ATTACHED FOR DETAILED DIRECTIONS 09/20/19   [provider]  Probiotic Product (PROBIOTIC PO) Take 1 capsule by mouth daily.    [provider]  valsartan-hydrochlorothiazide (DIOVAN-HCT) 320-25 MG tablet TAKE 1 TABLET BY MOUTH EVERY DAY 02/21/20   Hilty, Nadean Corwin, MD  XARELTO 20 MG TABS tablet TAKE 1 TABLET BY MOUTH DAILY WITH SUPPER 03/26/20   Hilty, Nadean Corwin, MD    Family History    Family History  Problem Relation Age of Onset  . Heart disease Mother   . Hypertension Mother   . Arthritis Mother   . Osteoporosis Mother   . Depression Sister   . Hyperlipidemia Sister   . Heart disease Father   . Diabetes Brother   . Hyperlipidemia Son   . Cancer Sister        unsure, related to smoking?  . Cancer Sister        lung cancer  . Hyperlipidemia Brother   . Hypertension Brother    She indicated that her mother is deceased. She indicated that her father is deceased. She indicated that only one of her three sisters is alive. She indicated that only one of her two brothers is alive. She indicated that  both of her sons are alive.  Social History    Social History   Socioeconomic History  . Marital status: Married    Spouse name: Not on file  . Number of children: 2  . Years of education: Not on file  . Highest education level: Not on file  Occupational History  . Occupation: retired Teacher, music: [employer]  Tobacco Use  . Smoking status: Former Smoker    Quit date: 12/19/1968    Years since quitting: 51.5  . Smokeless tobacco: Never Used  Vaping Use  . Vaping Use: Never used  Substance and Sexual Activity  . Alcohol use: Yes    Alcohol/week: 2.0 standard drinks    Types: 2 Glasses of wine per week    Comment: 1-3 glasses of wine 5/week (occ martini)  . Drug use: No  . Sexual activity: Not Currently  Other Topics Concern  . Not on file  Social History Narrative   Lives with husband.  2 sons live near [place]. Retired from working for [employer] 02/2016. 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   Updated 11/2019   Social Determinants of Health   Financial Resource Strain:   . Difficulty of Paying Living Expenses:   Food Insecurity:   . Worried About Charity fundraiser in the Last Year:   . Arboriculturist in the Last Year:   Transportation Needs:   . Film/video editor (Medical):   [name] Lack of Transportation (Non-Medical):   Physical Activity:   . Days of Exercise per Week:   . Minutes of Exercise per Session:   Stress:   . Feeling of Stress :   Social Connections:   . Frequency of Communication with Friends and Family:   . Frequency of Social Gatherings with Friends and Family:   .  Attends Religious Services:   . Active Member of Clubs or Organizations:   . Attends Theatre manager Meetings:   [name] Marital Status:   Intimate Partner Violence:   . Fear of Current or Ex-Partner:   . Emotionally Abused:   [name] Physically Abused:   . Sexually Abused:      Review of Systems    General:  No chills, fever, night sweats or weight changes.  Cardiovascular:  No chest pain, dyspnea on exertion, edema, orthopnea, palpitations, paroxysmal nocturnal dyspnea. Dermatological: No rash, lesions/masses Respiratory: No cough, dyspnea Urologic: No hematuria, dysuria Abdominal:   No nausea, vomiting, diarrhea, bright red blood per rectum, melena, or hematemesis Neurologic:  No visual changes, wkns, changes in mental status. All other systems reviewed and are otherwise negative except as noted above.  Physical Exam    VS:  BP 136/82   Pulse 71   Ht 5\' 4"  (1.626 m)   Wt 165 lb 6.4 oz (75 kg)   BMI 28.39 kg/m  , BMI Body mass index is 28.39 kg/m. GEN: Well nourished, well developed, in no acute distress. HEENT: normal. Neck: Supple, no JVD, carotid bruits, or masses. Cardiac: RRR, no murmurs, rubs, or gallops. No clubbing, cyanosis, edema.  Radials/DP/PT 2+ and equal bilaterally.  Respiratory:  Respirations regular and unlabored, clear to auscultation bilaterally. GI: Soft, nontender, nondistended, BS + x 4. MS: no deformity or atrophy.  Chest pain reproduced with deep palpation. Skin: warm and dry, no rash. Neuro:  Strength and sensation are intact. Psych: Normal affect.  Accessory Clinical Findings    ECG personally reviewed by me today-atrial fibrillation septal infarct undetermined age 22 bpm  EKG 02/17/2020 Atrial fibrillation 83 bpm  Echocardiogram 04/06/2017 Study Conclusions   - Left ventricle: The cavity size was normal. Systolic function was  normal. The estimated ejection fraction was in the range of 55%  to 60%. Wall motion was normal; there were no regional wall  motion abnormalities. There was no evidence of elevated  ventricular filling  pressure by Doppler parameters.  - Aortic valve: Trileaflet; normal thickness, mildly calcified  leaflets  Assessment & Plan   1.  Chest pain/chest pressure-appears to be multifactorial related to musculoskeletal pain and stress.  Reproduced during deep palpation. Okay to use cold compresses for 20 minutes at a time Slowly increase physical activity May use acetaminophen for analgesia Continue to monitor, reassured that was not related to her heart. Mindfulness stress reduction sheet given  Coronary artery disease-cardiac catheterization 2018 patent LAD stent and 30% proximal LAD disease with no other coronary stenosis/obstruction. Continue ezetimibe, metoprolol, Diovan to 3 hours and was elevated at Continue metoprolol Zetia aspirin Diovan Heart healthy low-sodium diet-salty 6 given Increase physical activity as tolerated  Persistent atrial fibrillation-EKG today shows atrial fibrillation 71 bpm.  Has undergone DCCV which was unsuccessful.  This patients CHA2DS2-VASc Score and unadjusted Ischemic Stroke Rate (% per year) is equal to 4.8 % stroke rate/year from a score of 4 Continue Xarelto, diltiazem, metoprolol Heart healthy low-sodium diet-salty 6 given Increase physical activity as tolerated Avoid triggers caffeine, chocolate, EtOH etc.  Essential hypertension-BP today  136/82.  Well-controlled at home Continue diltiazem, metoprolol, Diovan Heart healthy low-sodium diet-salty 6 given Increase physical activity as tolerated Continue blood pressure log  Hyperlipidemia-02/17/2020: Cholesterol, Total 182; HDL 95; LDL Chol Calc (NIH) 77; Triglycerides 49 Continue Zetia Heart healthy low-sodium high-fiber  Increase physical activity as tolerated    Disposition: Follow-up with [name] in 3 months.  Theresa Ng. Medora Roorda NP-C    06/11/2020, 11:19 AM [address] [place] [address] Office [phone] Fax

## 2020-06-11 ENCOUNTER — Ambulatory Visit: Payer: Medicare Other | Admitting: General Practice

## 2020-06-11 ENCOUNTER — Encounter: Payer: Self-pay | Admitting: General Practice

## 2020-06-11 ENCOUNTER — Other Ambulatory Visit: Payer: Self-pay

## 2020-06-11 VITALS — BP 136/82 | HR 71 | Ht 64.0 in | Wt 165.4 lb

## 2020-06-11 DIAGNOSIS — I10 Essential (primary) hypertension: Secondary | ICD-10-CM | POA: Diagnosis not present

## 2020-06-11 DIAGNOSIS — I2510 Atherosclerotic heart disease of native coronary artery without angina pectoris: Secondary | ICD-10-CM | POA: Diagnosis not present

## 2020-06-11 DIAGNOSIS — E782 Mixed hyperlipidemia: Secondary | ICD-10-CM | POA: Diagnosis not present

## 2020-06-11 DIAGNOSIS — I4819 Other persistent atrial fibrillation: Secondary | ICD-10-CM

## 2020-06-11 DIAGNOSIS — R0789 Other chest pain: Secondary | ICD-10-CM | POA: Diagnosis not present

## 2020-06-11 DIAGNOSIS — Z9861 Coronary angioplasty status: Secondary | ICD-10-CM

## 2020-06-11 NOTE — Patient Instructions (Signed)
Medication Instructions:  The current medical regimen is effective;  continue present plan and medications as directed. Please refer to the Current Medication list given to you today. *If you need a refill on your cardiac medications before your next appointment, please call your pharmacy*  Special Instructions REST AND TYLENOL-FOR PAIN  APPLY COOL COMPRESSION  ->ON FOR 20 MINUTES->OFF 20 MIN ALTERNATING  CONTINUE TO TAKE, LOG AND MONITOR BLOOD PRESSURE  PLEASE READ AND FOLLOW MINDFULNESS TIPS ATTACHED   PLEASE READ AND FOLLOW SALTY 6-ATTACHED  Follow-Up: Your next appointment:  3 month(s)  In Person with You may see [name], MD, Coletta Memos, FNP-C or one of the following Advanced Practice Providers on your designated Care Team:  Almyra Deforest, PA-C Fabian Sharp, [place] or Roby Lofts, PA-C   At [facility], you and your health needs are our priority.  As part of our continuing mission to provide you with exceptional heart care, we have created designated Provider Care Teams.  These Care Teams include your primary Cardiologist (physician) and Advanced Practice Providers (APPs -  Physician Assistants and Nurse Practitioners) who all work together to provide you with the care you need, when you need it.       Mindfulness-Based Stress Reduction Mindfulness-based stress reduction (MBSR) is a program that helps people learn to practice mindfulness. Mindfulness is the practice of intentionally paying attention to the present moment. It can be learned and practiced through techniques such as education, breathing exercises, meditation, and yoga. MBSR includes several mindfulness techniques in one program. MBSR works best when you understand the treatment, are willing to try new things, and can commit to spending time practicing what you learn. MBSR training may include learning about:  How your emotions, thoughts, and reactions affect your body.  New ways to respond to things that  cause negative thoughts to start (triggers).  How to notice your thoughts and let go of them.  Practicing awareness of everyday things that you normally do without thinking.  The techniques and goals of different types of meditation. What are the benefits of MBSR? MBSR can have many benefits, which include helping you to:  Develop self-awareness. This refers to knowing and understanding yourself.  Learn skills and attitudes that help you to participate in your own health care.  Learn new ways to care for yourself.  Be more accepting about how things are, and let things go.  Be less judgmental and approach things with an open mind.  Be patient with yourself and trust yourself more. MBSR has also been shown to:  Reduce negative emotions, such as depression and anxiety.  Improve memory and focus.  Change how you sense and approach pain.  Boost your body's ability to fight infections.  Help you connect better with other people.  Improve your sense of well-being. Follow these instructions at home:   Find a local in-person or online MBSR program.  Set aside some time regularly for mindfulness practice.  Find a mindfulness practice that works best for you. This may include one or more of the following: ? Meditation. Meditation involves focusing your mind on a certain thought or activity. ? Breathing awareness exercises. These help you to stay present by focusing on your breath. ? Body scan. For this practice, you lie down and pay attention to each part of your body from head to toe. You can identify tension and soreness and intentionally relax parts of your body. ? Yoga. Yoga involves stretching and breathing, and it can  improve your ability to move and be flexible. It can also provide an experience of testing your body's limits, which can help you release stress. ? Mindful eating. This way of eating involves focusing on the taste, texture, color, and smell of each bite of  food. Because this slows down eating and helps you feel full sooner, it can be an important part of a weight-loss plan.  Find a podcast or recording that provides guidance for breathing awareness, body scan, or meditation exercises. You can listen to these any time when you have a free moment to rest without distractions.  Follow your treatment plan as told by your health care provider. This may include taking regular medicines and making changes to your diet or lifestyle as recommended. How to practice mindfulness To do a basic awareness exercise:  Find a comfortable place to sit.  Pay attention to the present moment. Observe your thoughts, feelings, and surroundings just as they are.  Avoid placing judgment on yourself, your feelings, or your surroundings. Make note of any judgment that comes up, and let it go.  Your mind may wander, and that is okay. Make note of when your thoughts drift, and return your attention to the present moment. To do basic mindfulness meditation:  Find a comfortable place to sit. This may include a stable chair or a firm floor cushion. ? Sit upright with your back straight. Let your arms fall next to your side with your hands resting on your legs. ? If sitting in a chair, rest your feet flat on the floor. ? If sitting on a cushion, cross your legs in front of you.  Keep your head in a neutral position with your chin dropped slightly. Relax your jaw and rest the tip of your tongue on the roof of your mouth. Drop your gaze to the floor. You can close your eyes if you like.  Breathe normally and pay attention to your breath. Feel the air moving in and out of your nose. Feel your belly expanding and relaxing with each breath.  Your mind may wander, and that is okay. Make note of when your thoughts drift, and return your attention to your breath.  Avoid placing judgment on yourself, your feelings, or your surroundings. Make note of any judgment or feelings that  come up, let them go, and bring your attention back to your breath.  When you are ready, lift your gaze or open your eyes. Pay attention to how your body feels after the meditation. Where to find more information You can find more information about MBSR from:  Your health care provider.  Community-based meditation centers or programs.  Programs offered near you. Summary  Mindfulness-based stress reduction (MBSR) is a program that teaches you how to intentionally pay attention to the present moment. It is used with other treatments to help you cope better with daily stress, emotions, and pain.  MBSR focuses on developing self-awareness, which allows you to respond to life stress without judgment or negative emotions.  MBSR programs may involve learning different mindfulness practices, such as breathing exercises, meditation, yoga, body scan, or mindful eating. Find a mindfulness practice that works best for you, and set aside time for it on a regular basis. This information is not intended to replace advice given to you by your health care provider. Make sure you discuss any questions you have with your health care provider.

## 2020-07-09 DIAGNOSIS — G4733 Obstructive sleep apnea (adult) (pediatric): Secondary | ICD-10-CM | POA: Diagnosis not present

## 2020-07-17 DIAGNOSIS — Z85828 Personal history of other malignant neoplasm of skin: Secondary | ICD-10-CM | POA: Diagnosis not present

## 2020-07-17 DIAGNOSIS — D225 Melanocytic nevi of trunk: Secondary | ICD-10-CM | POA: Diagnosis not present

## 2020-07-17 DIAGNOSIS — D1801 Hemangioma of skin and subcutaneous tissue: Secondary | ICD-10-CM | POA: Diagnosis not present

## 2020-07-17 DIAGNOSIS — B070 Plantar wart: Secondary | ICD-10-CM | POA: Diagnosis not present

## 2020-07-17 DIAGNOSIS — L570 Actinic keratosis: Secondary | ICD-10-CM | POA: Diagnosis not present

## 2020-07-17 DIAGNOSIS — D2271 Melanocytic nevi of right lower limb, including hip: Secondary | ICD-10-CM | POA: Diagnosis not present

## 2020-08-09 DIAGNOSIS — G4733 Obstructive sleep apnea (adult) (pediatric): Secondary | ICD-10-CM | POA: Diagnosis not present

## 2020-08-12 NOTE — Progress Notes (Signed)
Will keep checking and will inform pt when monies are available

## 2020-08-18 ENCOUNTER — Ambulatory Visit: Payer: Medicare Other | Admitting: Internal Medicine

## 2020-09-08 ENCOUNTER — Other Ambulatory Visit: Payer: Self-pay

## 2020-09-08 ENCOUNTER — Other Ambulatory Visit (INDEPENDENT_AMBULATORY_CARE_PROVIDER_SITE_OTHER): Payer: Medicare Other

## 2020-09-08 DIAGNOSIS — Z23 Encounter for immunization: Secondary | ICD-10-CM

## 2020-09-09 DIAGNOSIS — G4733 Obstructive sleep apnea (adult) (pediatric): Secondary | ICD-10-CM | POA: Diagnosis not present

## 2020-09-14 ENCOUNTER — Other Ambulatory Visit: Payer: Self-pay

## 2020-09-14 ENCOUNTER — Encounter: Payer: Self-pay | Admitting: Internal Medicine

## 2020-09-14 ENCOUNTER — Ambulatory Visit: Payer: Medicare Other | Admitting: Internal Medicine

## 2020-09-14 VITALS — BP 126/88 | HR 82 | Ht 64.0 in | Wt 163.0 lb

## 2020-09-14 DIAGNOSIS — I2510 Atherosclerotic heart disease of native coronary artery without angina pectoris: Secondary | ICD-10-CM | POA: Diagnosis not present

## 2020-09-14 DIAGNOSIS — Z9861 Coronary angioplasty status: Secondary | ICD-10-CM

## 2020-09-14 DIAGNOSIS — I4819 Other persistent atrial fibrillation: Secondary | ICD-10-CM

## 2020-09-14 DIAGNOSIS — I10 Essential (primary) hypertension: Secondary | ICD-10-CM | POA: Diagnosis not present

## 2020-09-14 NOTE — Patient Instructions (Signed)

## 2020-09-14 NOTE — Progress Notes (Signed)
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 Recently, she developed substernal chest pain in the fall and underwent cardiac catheterization. This demonstrated patent LAD stent that was proximal as well as 30% disease of the LAD just prior to that stent, but no other areas of obstructive coronary disease. She continued to have chest pain; however, I felt it was most likely related to stress. I have also encouraged dietary changes as well as trying to reduce her stress and alcohol intake. She has accomplished that to some extent and, over the past 6 months has had no other symptoms of chest discomfort. She occasionally gets short of breath with heavy exertion and has some swelling in her legs only when standing up for long periods of time. She continues to work at [employer] although there is some stress she is actually maintaining her exercise regimen. This is clearly demonstrated improvement in her risk factors including a reduction in cholesterol. A recent lipid NMR demonstrated a particle number of 631 and an LDL cholesterol of 81.   Theresa Mccarty returns for annual followup today. She reports feeling very well. She recently had the shingles vaccine at [facility]. She had some questions today about starting coconut oil glucosamine/chondroitin which I think are fine. She did have one episode of discomfort in her chest after working in regard for which she took nitroglycerin however she noted no improvement in her symptoms. She is very interested in decreasing her Lipitor she does report some soreness in her arm muscles as well as her leg muscles. She believes this is related to the statin. She has not been on any other statin since her stent was placed in 2008. She is interested a recheck of  her cholesterol today as well as a screening hemoglobin A1c - she is nondiabetic.  Finally, she recently had a fall at home which was due to tripping over a table leg. She did have some soreness in her right hand as well as ecchymosis and swelling of her digits and 2 bandages on her right toes.  [name] has no specific complaints today. She denies any chest pain or worsening shortness of breath. Blood pressure is at goal. She continues to take aspirin and Plavix without any bleeding complications. Cholesterol is due for recheck. She does report some mild calf tenderness which she attributes to a atorvastatin.  06/13/2016  Theresa Mccarty returns today for follow-up. Over the past year she's done fairly well. She's had some struggles with arthritis. She noted that she was having some side effects related to atorvastatin including weakness in her arm which improved after discontinuing the medicine. She says she's had about 8 pound weight loss however we demonstrated only 1-2 pounds in the office. She is currently on red yeast rice and wishes to have a fasting lipid profile today. She denies any significant chest discomfort or worsening shortness of breath. She recently retired from her job and notes less stress in her life. Blood pressure is well-controlled today.  04/20/2017  Theresa Mccarty was seen today in follow-up. Recently she was seen by Kerin Ransom and had symptoms of worsening fatigue and palpitations. She was found to be in A. fib with RVR. He adjusted some medications for rate control including adding diltiazem and started her on  Eliquis. She reports she's been taking Eliquis for the past several weeks however did miss at least one dose possibly 2. Today heart rate is just below 100 and her rhythm is irregular. She reports she feels better but still is fatigued. We discussed the possibility of a cardioversion attempt. She understands she would need at least 3 weeks of uninterrupted anticoagulation or TEE guided  approach in order to do that. She feels that she could be more compliant with medication.  06/16/2017  Theresa Mccarty returns today for follow-up. She underwent cardioversion which was unsuccessful. Subsequently she said that she does not think that she was terribly symptomatic therefore we proceeded rate controlled. I increased her diltiazem and heart rate is well-controlled in the 80s today. She reports no significant fatigue. She is switched from Eliquis to Xarelto due to the fact that she had trouble remembering the second dose of Eliquis. Blood pressure was elevated somewhat initially 154/95 however came down to 138/88. She also reports some abdominal bloating but his recent started on probiotics. She gets some lower extremity swelling but has some evidence of venous insufficiency. She is on hydrochlorothiazide with Diovan.  12/04/2017  Theresa Mccarty was seen today in follow-up.  She continues to be without complaints.  She denies any chest pain or worsening shortness of breath.  She remains in persistent if not permanent atrial fibrillation.  She has been on Xarelto which is been easier for her to remember however she is concerned about the cost, particularly now that she is in the donut hole is causing her about $500 a month.  Blood pressure is pretty well controlled.  She showed me numbers at home indicating blood pressures between 440 and 102 systolic.  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  05/21/2018  Theresa Mccarty was seen today in follow-up.  She denies chest pain or shortness of breath.  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  She is in persistent if not permanent atrial fibrillation.  Her family recently purchased a [place] mobile watch for her to monitor her a-fib and she has the ability to send EKGs to me.  Blood pressure is reasonably well controlled today.  We recently  discussed her lipid profile which in March showed an LDL of 84 with total cholesterol 183.  She has been taking ezetimibe but is statin intolerant.  Her goal LDL is less than 70 given her history of coronary artery disease and she may be a candidate for PCSK9 inhibitor therapy.  In the past she had intolerance to atorvastatin, but did not recall she had tried rosuvastatin.  09/03/2018  Laurren returns today for follow-up.  She is now getting up for furniture market next week.  She denies any chest pain or worsening shortness of breath.  She is noted to be in permanent A. fib with rate control.  She says she is tolerated changes in her medications including the recent addition of ezetimibe.  Recent lipid profile indicated total cholesterol 160, HDL 100 LDL 51 and triglycerides 46.  Blood pressure appears well-controlled and she is asymptomatic at this time.  08/13/2019  Shaunika seen today in follow-up.  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  She has stopped taking her statin medication.  She found  that she was having some chest discomfort that resolved.  She continues on ezetimibe.  Her labs have been well controlled but she is due for recheck lipid profile.  Blood pressure was elevated today 150/90.  She says at home is generally 110-130s.  She brought in cardia mobile strips which shows that she is in A. fib almost permanently.  02/17/2020  Elsa returns for follow-up.  She reports some occasional shortness of breath.  She says she still exercises regularly.  She is due for her first Covid vaccine in a few days.  She reports compliance with CPAP and has a follow-up appoint with Dr. Claiborne Billings in a few weeks.  She has been having some issues with going through the water reservoir very rapidly.  Blood pressure initially was elevated today however came down to 148/88.  She is overdue for lab  work.  09/14/2020  Calista is seen today in follow-up.  She reports that she still struggles with her weight.  She is concerned about intermittent weight gain.  She has some dyspnea despite fairly regular treadmill exercise.  Blood pressure is actually well controlled today.  She is overall unaware of her A. fib and has been asymptomatic with this.  She is rate controlled at 82.  She denies any bleeding issues on Xarelto.  PMHx:  Past Medical History:  Diagnosis Date  . Atrial fibrillation ([place])    a. initially diagnosed in 02/2017. Started on Eliquis  . Back pain, chronic   . Broken leg 1958  . CAD (coronary artery disease)    a. s/p DES to LAD in 2008, patent by cath in 2012.  [name] Hearing loss    bilateral hearing aids  . History of echocardiogram 09/11/2007   normal  . History of nuclear stress test 11/17/2010   exercise; small fixed anteroseptal defect (?artifact); short run of atrial-tachy during recovery; low risk  . Hyperlipidemia   . Hypertension   . Hypothyroid   . Impaired fasting glucose   . Osteoporosis   . Renal cell carcinoma right; 2011   Dr. Alinda Money, [facility]  . Sleep apnea    on CPAP    Past Surgical History:  Procedure Laterality Date  . ABDOMINAL HYSTERECTOMY  1975   PARTIAL; part of 1 ovary remains  . CARDIAC CATHETERIZATION  10/08/2011   LAD-prox stent patent; no significant obstructive CAD; hi LVF end-diastolic pressure  . CARDIOVERSION N/A 05/12/2017   Procedure: CARDIOVERSION;  Surgeon: [name], MD;  Location[facility] ENDOSCOPY;  Service: Cardiovascular;  Laterality: N/A;  . CATARACT EXTRACTION, BILATERAL Bilateral 08/2019   Dr. [name]  . COLONOSCOPY  03/21/2006   polyps (hyperplastic polyps), diverticula, internal hemorrhoids; Dr. Benson Norway  . COLONOSCOPY  07/26/2011   diverticula, medium hemorrhoids; no rec f/u unless sx  . CORONARY ANGIOPLASTY WITH STENT PLACEMENT  2008   mid LAD  . IR GENERIC HISTORICAL  08/18/2016   IR RADIOLOGIST EVAL &  MGMT 08/18/2016 [name], MD [facility] INTERV RAD  . IR RADIOLOGIST EVAL & MGMT  08/09/2018  . IR RADIOLOGIST EVAL & MGMT  09/19/2019  . KIDNEY SURGERY  2011   renal carcinoma - cryoablation -  Dr. Alinda Money and Dr. Laural Roes (cryoablation)  . SPINE SURGERY      FAMHx:  Family History  Problem Relation Age of Onset  . Heart disease Mother   . Hypertension Mother   . Arthritis Mother   . Osteoporosis Mother   . Depression Sister   . Hyperlipidemia Sister   .  Heart disease Father   . Diabetes Brother   . Hyperlipidemia Son   . Cancer Sister        unsure, related to smoking?  . Cancer Sister        lung cancer  . Hyperlipidemia Brother   . Hypertension Brother     SOCHx:   reports that she quit smoking about 51 years ago. She has never used smokeless tobacco. She reports current alcohol use of about 2.0 standard drinks of alcohol per week. She reports that she does not use drugs.  ALLERGIES:  Allergies  Allergen Reactions  . Atorvastatin Other (See Comments)    Muscle aches  . Celebrex [Celecoxib] Other (See Comments)    Muscle aches  . Nitrofurantoin Nausea Only and Rash    ROS: Pertinent items noted in HPI and remainder of comprehensive ROS otherwise negative.  HOME MEDS: Current Outpatient Medications  Medication Sig Dispense Refill  . alendronate (FOSAMAX) 70 MG tablet TAKE 1 TABLET BY MOUTH EVERY 7 DAYS. TAKE WITH A FULL GLASS OF WATER ON AN EMPTY STOMACH. 12 tablet 3  . aspirin EC 81 MG tablet Take 1 tablet (81 mg total) by mouth daily. 90 tablet 3  . B Complex Vitamins (B-COMPLEX/B-12 PO) Take 1 tablet by mouth daily.      . Calcium-Magnesium-Zinc 500-250-12.5 MG TABS Take 1 tablet by mouth daily.    . Cholecalciferol (VITAMIN D) 2000 units tablet Take 2,000 Units by mouth daily.    . Coenzyme Q10 (COQ10) 100 MG CAPS Take 1 tablet by mouth daily.     [name] diltiazem (CARDIZEM CD) 240 MG 24 hr capsule TAKE 1 CAPSULE BY MOUTH EVERY DAY 90 capsule 2  . ezetimibe  (ZETIA) 10 MG tablet TAKE 1 TABLET BY MOUTH EVERY DAY 90 tablet 1  . levothyroxine (SYNTHROID) 50 MCG tablet TAKE 1 TABLET BY MOUTH EVERY DAY 90 tablet 1  . metoprolol succinate (TOPROL-XL) 25 MG 24 hr tablet TAKE 12.5MG  (1/2 TABLET DAILY) FOR 1-2 WEEKS IF YOU ARE STILL HAVING INCREASED HEART RATE AND BLOOD PRESSURE, YOU MAY TAKE 25MG  (1 WHOLE TABLET DAILY) 90 tablet 3  . MILK THISTLE PO Take 1 capsule by mouth daily.    . nitroGLYCERIN (NITROSTAT) 0.4 MG SL tablet PLACE 1 TABLET (0.4 MG TOTAL) UNDER THE TONGUE EVERY 5 (FIVE) MINUTES AS NEEDED FOR CHEST PAIN. 25 tablet 4  . Probiotic Product (PROBIOTIC PO) Take 1 capsule by mouth daily.    . valsartan-hydrochlorothiazide (DIOVAN-HCT) 320-25 MG tablet TAKE 1 TABLET BY MOUTH EVERY DAY 90 tablet 3  . XARELTO 20 MG TABS tablet TAKE 1 TABLET BY MOUTH DAILY WITH SUPPER 90 tablet 1   No current facility-administered medications for this visit.    LABS/IMAGING: No results found for this or any previous visit (from the past 48 hour(s)). No results found.  VITALS: BP 126/88   Pulse 82   Ht 5\' 4"  (1.626 m)   Wt 163 lb (73.9 kg)   BMI 27.98 kg/m   EXAM: General appearance: alert and no distress Neck: no carotid bruit and no JVD Lungs: clear to auscultation bilaterally Heart: irregularly irregular rhythm Abdomen: soft, non-tender; bowel sounds normal; no masses,  no organomegaly Extremities: extremities normal, atraumatic, no cyanosis or edema Pulses: 2+ and symmetric Skin: Skin color, texture, turgor normal. No rashes or lesions Neurologic: Grossly normal Psych: Pleasant  EKG: A. fib at 82-personally reviewed  ASSESSMENT: 1. Persistent atrial fibrillation with controlled ventricular response - failed cardioversion 2. CHADSVASC score  of 4 on Xarelto 3. Coronary artery disease status post PCI of the proximal LAD in 2008 4. Hypertension - controlled 5. Hyperlipidemia -not at goal LDL less than 70 6. Statin intolerance 7. Obstructive  sleep apnea on CPAP - 100% compliant 8. Arthritis  PLAN: 1.   Mrs. Wethington continues to have intermittent issues with weight as well as shortness of breath.  She remains in A. fib which is rate controlled.  She denies any chest pain.  She reports compliance with CPAP and exercises regularly.  No changes to her medicines today.  Follow-up annually or sooner as necessary.  [name], MD, [facility], [place] Director of the Advanced Lipid Disorders &  Cardiovascular Risk Reduction Clinic Attending Cardiologist  Direct Dial: [phone]  Fax: [phone]  Website:  [email or web address] 09/14/2020, 10:17 AM

## 2020-10-09 DIAGNOSIS — G4733 Obstructive sleep apnea (adult) (pediatric): Secondary | ICD-10-CM | POA: Diagnosis not present

## 2020-10-19 ENCOUNTER — Other Ambulatory Visit: Payer: Self-pay | Admitting: Internal Medicine

## 2020-11-09 DIAGNOSIS — G4733 Obstructive sleep apnea (adult) (pediatric): Secondary | ICD-10-CM | POA: Diagnosis not present

## 2020-11-14 ENCOUNTER — Other Ambulatory Visit: Payer: Self-pay | Admitting: Internal Medicine

## 2020-11-14 DIAGNOSIS — E785 Hyperlipidemia, unspecified: Secondary | ICD-10-CM

## 2020-11-17 ENCOUNTER — Other Ambulatory Visit: Payer: Self-pay

## 2020-11-17 ENCOUNTER — Ambulatory Visit (HOSPITAL_COMMUNITY)
Admission: RE | Admit: 2020-11-17 | Discharge: 2020-11-17 | Disposition: A | Discharge status: Home / Self Care | Payer: Medicare Other | Source: Ambulatory Visit | Attending: Urology | Admitting: Urology

## 2020-11-17 ENCOUNTER — Other Ambulatory Visit (HOSPITAL_COMMUNITY): Payer: Self-pay | Admitting: Urology

## 2020-11-17 DIAGNOSIS — C641 Malignant neoplasm of right kidney, except renal pelvis: Secondary | ICD-10-CM | POA: Diagnosis not present

## 2020-11-17 DIAGNOSIS — Z85528 Personal history of other malignant neoplasm of kidney: Secondary | ICD-10-CM | POA: Diagnosis not present

## 2020-11-17 DIAGNOSIS — I700 Atherosclerosis of aorta: Secondary | ICD-10-CM | POA: Diagnosis not present

## 2020-11-27 DIAGNOSIS — Z85528 Personal history of other malignant neoplasm of kidney: Secondary | ICD-10-CM | POA: Diagnosis not present

## 2020-12-02 ENCOUNTER — Other Ambulatory Visit: Payer: Self-pay | Admitting: Family Medicine

## 2020-12-02 ENCOUNTER — Other Ambulatory Visit: Payer: Self-pay | Admitting: Internal Medicine

## 2020-12-02 DIAGNOSIS — E039 Hypothyroidism, unspecified: Secondary | ICD-10-CM

## 2020-12-02 DIAGNOSIS — E785 Hyperlipidemia, unspecified: Secondary | ICD-10-CM

## 2020-12-02 DIAGNOSIS — I10 Essential (primary) hypertension: Secondary | ICD-10-CM

## 2020-12-02 DIAGNOSIS — Z0000 Encounter for general adult medical examination without abnormal findings: Secondary | ICD-10-CM

## 2020-12-21 ENCOUNTER — Ambulatory Visit: Payer: Medicare Other | Admitting: Family Medicine

## 2021-01-01 ENCOUNTER — Telehealth: Payer: Self-pay | Admitting: Internal Medicine

## 2021-01-01 NOTE — Telephone Encounter (Signed)
Looks like her blood pressure responded well to taking metoprolol 25mg  (full tablet).  Okay to get booster dose but , please, take metoprolol 25mg  (full tablet) the day of the injection and dayafter vaccination.

## 2021-01-01 NOTE — Telephone Encounter (Signed)
Spoke with patient, patient is already taking 25mg  of metoprolol daily. She will monitor her blood pressure and think about the booster a little further. She reports her son went into cardiac arrest following his booster and she is anxious. Spoke to patient about being anxious before the booster and how that may affect her blood pressure. Patient states she is going to monitor her blood pressure a little closer for the next week or so and if it is elevated consistently she will call the office back to discuss.

## 2021-01-01 NOTE — Telephone Encounter (Signed)
  Per patient schedule request pool message:  Just want to get advice on getting Covid booster shot since it raised my blood pressure dramatically when I got 2nd shot. Thank you.

## 2021-01-04 ENCOUNTER — Ambulatory Visit: Payer: Medicare Other | Admitting: Family Medicine

## 2021-01-06 ENCOUNTER — Encounter: Payer: Self-pay | Admitting: Family Medicine

## 2021-01-06 ENCOUNTER — Ambulatory Visit (INDEPENDENT_AMBULATORY_CARE_PROVIDER_SITE_OTHER): Payer: Medicare Other | Admitting: Family Medicine

## 2021-01-06 ENCOUNTER — Other Ambulatory Visit: Payer: Self-pay

## 2021-01-06 VITALS — BP 150/88 | HR 72 | Ht 64.0 in | Wt 161.5 lb

## 2021-01-06 DIAGNOSIS — Z7901 Long term (current) use of anticoagulants: Secondary | ICD-10-CM | POA: Diagnosis not present

## 2021-01-06 DIAGNOSIS — M545 Low back pain, unspecified: Secondary | ICD-10-CM

## 2021-01-06 DIAGNOSIS — S80819A Abrasion, unspecified lower leg, initial encounter: Secondary | ICD-10-CM | POA: Diagnosis not present

## 2021-01-06 DIAGNOSIS — M546 Pain in thoracic spine: Secondary | ICD-10-CM

## 2021-01-06 LAB — POCT URINALYSIS DIP (PROADVANTAGE DEVICE)
Bilirubin, UA: NEGATIVE
Blood, UA: NEGATIVE
Glucose, UA: NEGATIVE mg/dL
Ketones, POC UA: NEGATIVE mg/dL
Leukocytes, UA: NEGATIVE
Nitrite, UA: NEGATIVE
Specific Gravity, Urine: 1.01
Urobilinogen, Ur: NEGATIVE
pH, UA: 7.5 (ref 5.0–8.0)

## 2021-01-06 MED ORDER — TRAMADOL HCL 50 MG PO TABS: 50.0000 mg | ORAL_TABLET | Freq: Three times a day (TID) | ORAL | 0 refills | Status: AC | PRN

## 2021-01-06 MED ORDER — METHOCARBAMOL 500 MG PO TABS: 500.0000 mg | ORAL_TABLET | Freq: Three times a day (TID) | ORAL | 0 refills | Status: DC | PRN

## 2021-01-06 NOTE — Progress Notes (Signed)
Chief Complaint  Patient presents with  . Back Pain    Patient states that she always has back pain (has hx). Carried a 22lb [place] up the stairs and it may triggered this on Christmas Day. Pain is worsening and it is right sided and lower.    She has a h/o chronic back pain (sometimes lower, often up by her shoulders, related to work). She noticed some worsening pain 3 weeks ago, shortly after Christmas, but this morning got much worse.  She had been sleeping well, not bothered at night. This morning when she sat up and made the bed, she was in severe pain. Intensified as she moved to make the bed. Today she couldn't get comfortable sitting, standing. No radiation of the pain into the hip/buttock/leg  No numbness or tingling.  Currently her pain is on her R side of back, in the mid-portion of the spine. She has been using a heating pad and patch (Biofreeze, or something similar, Aspercreme), which helps.  She isn't feeling as bad as she did this morning.  Earlier, it hurt to cough (like when she fractured ribs in the past). No trauma, injury, fall or change inactivity (other than carrying the heavy [place] for Christmas). She carried it differently than at Thanksgiving (this time with one arm, resting it on the stair as she walked up). She has also been packing, cleaning.  Seeing ortho-spine doctor tomorrow for her back pain (who did her back surgery in the past--sounds as though she had treatment for compression fracture, no well documented in her chart). She believes the prior surgery was about 12 years ago (kyphoplasty? verterbroplasty?   Second issue is that she wants a wound on her RLE checked.  She hit R shin with her car door about 2 weeks ago.  It is still a little red.  She denies any drainage, worsening pain, fever.   PMH, PSH ,SH and FH reviewed/updated Son had cardiac arrest Memorial Day  Outpatient Encounter Medications as of 01/06/2021  Medication Sig  . alendronate  (FOSAMAX) 70 MG tablet TAKE 1 TABLET BY MOUTH EVERY 7 DAYS. TAKE WITH A FULL GLASS OF WATER ON AN EMPTY STOMACH.  [name] aspirin EC 81 MG tablet Take 1 tablet (81 mg total) by mouth daily.  . B Complex Vitamins (B-COMPLEX/B-12 PO) Take 1 tablet by mouth daily.  . Calcium-Magnesium-Zinc 500-250-12.5 MG TABS Take 1 tablet by mouth daily.  . Cholecalciferol (VITAMIN D) 2000 units tablet Take 2,000 Units by mouth daily.  . Coenzyme Q10 (COQ10) 100 MG CAPS Take 1 tablet by mouth daily.   [name] diltiazem (CARDIZEM CD) 240 MG 24 hr capsule TAKE 1 CAPSULE BY MOUTH EVERY DAY  . ezetimibe (ZETIA) 10 MG tablet TAKE 1 TABLET BY MOUTH EVERY DAY  . levothyroxine (SYNTHROID) 50 MCG tablet TAKE 1 TABLET BY MOUTH EVERY DAY  . metoprolol succinate (TOPROL-XL) 25 MG 24 hr tablet Take 25 mg by mouth daily.  [name] MILK THISTLE PO Take 1 capsule by mouth daily.  . Probiotic Product (PROBIOTIC PO) Take 1 capsule by mouth daily.  . valsartan-hydrochlorothiazide (DIOVAN-HCT) 320-25 MG tablet TAKE 1 TABLET BY MOUTH EVERY DAY  . XARELTO 20 MG TABS tablet TAKE 1 TABLET BY MOUTH DAILY WITH SUPPER  . [DISCONTINUED] metoprolol succinate (TOPROL-XL) 25 MG 24 hr tablet TAKE 12.5MG  (1/2 TABLET DAILY) FOR 1-2 WEEKS IF YOU ARE STILL HAVING INCREASED HEART RATE AND BLOOD PRESSURE, YOU MAY TAKE 25MG  (1 WHOLE TABLET DAILY) (Patient taking differently: Take by  mouth daily.)  . nitroGLYCERIN (NITROSTAT) 0.4 MG SL tablet PLACE 1 TABLET (0.4 MG TOTAL) UNDER THE TONGUE EVERY 5 (FIVE) MINUTES AS NEEDED FOR CHEST PAIN. (Patient not taking: Reported on 01/06/2021)   No facility-administered encounter medications on file as of 01/06/2021.   Allergies  Allergen Reactions  . Atorvastatin Other (See Comments)    Muscle aches  . Celebrex [Celecoxib] Other (See Comments)    Muscle aches  . Nitrofurantoin Nausea Only and Rash   ROS: no fever, chills, URI symptoms, cough, shortness of breath. R sided back pain per HPI.  No radiation, numbness, tingling,  weakness. No urinary complaints. No GI complaints. She is on blood thinners, denies any bleeding Wound RLE per HPI.   PHYSICAL EXAM: BP (!) 150/88   Pulse 72   Ht 5\' 4"  (1.626 m)   Wt 161 lb 8 oz (73.3 kg)   BMI 27.72 kg/m   Pleasant, overweight female, in good spirits, but in moderate discomfort with position changes, and mild discomfort when taking deep breaths. Heart: regular rate and rhythm Lungs: clear bilaterally Abdomen: soft, nontender, no mass Extremities: no edema. R shin--small eschar/scab.  No drainage or surrounding erythema. Psych: normal mood, affect, grooming Back: Spine is nontender. Area of discomfort is to the right of midline at the mid-lower thoracic spine. Nontender to touch, but hurts in this area with movement. No CVA tenderness  Normal urine (trace protein, otherwise normal)  ASSESSMENT/PLAN:  Acute right-sided thoracic back pain - suspect muscle spasm, though nontender currently. Can't take NSAIDs (on blood thinners). Heat, massage, topical meds. robaxin and tramadol prn. Ortho tomorrow - Plan: methocarbamol (ROBAXIN) 500 MG tablet, traMADol (ULTRAM) 50 MG tablet  Low back pain, unspecified back pain laterality, unspecified chronicity, unspecified whether sciatica present - Plan: POCT Urinalysis DIP (Proadvantage Device)  Abrasion, leg w/o infection - RLE.  use antibacterial ointment.  Reviewed s/sx of infection, and to return if worsening.  Long term (current) use of anticoagulants - avoid NSAIDs    Discussed imaging recommendation--can consider given her prior injury/surgery.  She isn't tender to palpation anywhere currently, so will hold off until she sees ortho tomorrow, as she has already scheduled an appointment there for tomorrow.  Risks/SE of robaxin and tramadol reviewed in detail. She is hesitant to use any meds--advised she doesn't need to if patches and heat are working, but giving them to her to have on hand if pain worsens later today.   She can use tylenol as needed as well, saving the tramadol for more severe pain.

## 2021-01-06 NOTE — Patient Instructions (Signed)
Continue heat as needed. Continue patches (biofreeze or salon-pas with lidocaine).  Use the methocarbamol (muscle relaxant) as needed for spasm. Use the tramadol only if needed for severe pain. You can use Tylenol products for milder pain. Use caution in case these medications cause sedation; don't drive while taking them, or drink alcohol.  Since you have the orthopedic appointment tomorrow, I'll defer any imaging to them. Let them know you got these prescriptions.  Let us know if symptoms aren't improving.

## 2021-01-07 ENCOUNTER — Encounter: Payer: Self-pay | Admitting: Physician Assistant

## 2021-01-07 ENCOUNTER — Ambulatory Visit: Payer: Medicare Other | Admitting: Physician Assistant

## 2021-01-07 ENCOUNTER — Ambulatory Visit: Payer: Self-pay

## 2021-01-07 DIAGNOSIS — K862 Cyst of pancreas: Secondary | ICD-10-CM | POA: Insufficient documentation

## 2021-01-07 DIAGNOSIS — M545 Low back pain, unspecified: Secondary | ICD-10-CM | POA: Diagnosis not present

## 2021-01-07 MED ORDER — METHYLPREDNISOLONE 4 MG PO TABS: ORAL_TABLET | ORAL | 0 refills | Status: DC

## 2021-01-07 NOTE — Progress Notes (Signed)
Office Visit Note   Patient: Theresa Mccarty           Date of Birth: 1941-06-30           MRN: [id number] Visit Date: 01/07/2021              Requested by: [name], [address],  [place] [address] PCP: [name], MD   Assessment & Plan: Visit Diagnoses:  1. Low back pain, unspecified back pain laterality, unspecified chronicity, unspecified whether sciatica present     Plan:  We will send her to formal physical therapy for modalities, core strengthening, IT band hamstring stretching, home exercise program and back exercises.  She is placed on Medrol Dosepak she will also take her methocarbamol which has been prescribed to her.  See her back in 1 month if her condition fails to improve or becomes worse recommend MRI to rule out HNP as a source of her pain.  Questions were encouraged and answered at length.  Follow-Up Instructions: Return in about 4 weeks (around 02/04/2021).   Orders:  Orders Placed This Encounter  Procedures  . XR Lumbar Spine 2-3 Views   Meds ordered this encounter  Medications  . methylPREDNISolone (MEDROL) 4 MG tablet    Sig: Take as directed    Dispense:  21 tablet    Refill:  0      Procedures: No procedures performed   Clinical Data: No additional findings.   Subjective: Chief Complaint  Patient presents with  . Lower Back - Pain    HPI Theresa Mccarty 80 year old female who we have not seen in quite a few years comes in today with low back pain.  She states that it began a few days after Christmas.  It is becoming worse over the last few days.  She has had no particular injury.  She did see her primary care physician who gave her Robaxin and Ultram she is taking the Robaxin but does not take the Ultram.  She has pain in her low back with coughing or deep breathing.  She states that the pain awakens her at night.  She denies any bowel or bladder dysfunction denies any saddle anesthesia like symptoms.  Denies any recent falls  or injuries.  Pain is worse when lying down.  She has tried a heating pad salon patches with lidocaine and Aspercreme patches. She denies any radicular symptoms down either leg.  She does have a history of a T8 fracture back in 2010 or 11.  Review of Systems See HPI otherwise negative  Objective: Vital Signs: There were no vitals taken for this visit.  Physical Exam General: Well-developed well-nourished female no acute distress.  She does have difficulty getting on and off the exam.  We will due to low back pain.  She ambulates without any assistive device.  Psych: Alert and oriented x3. Vascular: Dorsal pedal pulses are 2+ equal and symmetric.  Calves are supple and nontender bilaterally. Ortho Exam Lower extremities negative straight leg raise bilaterally.  5 out of 5 strength throughout the lower extremities against resistance.  She is able to touch her toes.  She has limited extension of the lumbar spine.  She has tenderness in the paraspinous region bilaterally lower lumbar region.  No tenderness over the spinal column. Specialty Comments:  No specialty comments available.  Imaging: XR Lumbar Spine 2-3 Views  Result Date: 01/07/2021 Lumbar spine 2 views: No acute fractures.  Significant scoliosis is present.  Arthrosclerosis  of the aorta is noted.  This space is overall well maintained.  Mid lumbar spine anterior endplate spurring.  No spondylolisthesis .     PMFS History: Patient Active Problem List   Diagnosis Date Noted  . Pancreatic cyst 01/07/2021  . Mixed hyperlipidemia 09/03/2018  . Coronary artery disease involving native coronary artery of native heart without angina pectoris 05/21/2018  . Persistent atrial fibrillation ([place]) 03/16/2017  . Essential hypertension 06/13/2016  . Dyslipidemia 06/13/2016  . Annual physical exam 06/13/2016  . Renal cell carcinoma ([place])   . Renal cyst, left   . Hypothyroidism 08/09/2012  . CAD S/P percutaneous coronary angioplasty  08/09/2012  . Essential hypertension, benign 08/09/2012  . Pure hypercholesterolemia 08/09/2012  . OSA on CPAP 08/09/2012  . Osteoporosis 08/09/2012   Past Medical History:  Diagnosis Date  . Atrial fibrillation ([place])    a. initially diagnosed in 02/2017. Started on Eliquis  . Back pain, chronic   . Broken leg 1958  . CAD (coronary artery disease)    a. s/p DES to LAD in 2008, patent by cath in 2012.  [name] Hearing loss    bilateral hearing aids  . History of echocardiogram 09/11/2007   normal  . History of nuclear stress test 11/17/2010   exercise; small fixed anteroseptal defect (?artifact); short run of atrial-tachy during recovery; low risk  . Hyperlipidemia   . Hypertension   . Hypothyroid   . Impaired fasting glucose   . Osteoporosis   . Renal cell carcinoma right; 2011   Dr. Alinda Money, [facility]  . Sleep apnea    on CPAP    Family History  Problem Relation Age of Onset  . Heart disease Mother   . Hypertension Mother   . Arthritis Mother   . Osteoporosis Mother   . Depression Sister   . Hyperlipidemia Sister   . Heart disease Father   . Diabetes Brother   . Hyperlipidemia Son   . Heart disease Son 45       cardiac arrest, 4 blockages, stents  . Cancer Sister        unsure, related to smoking?  . Cancer Sister        lung cancer  . Hyperlipidemia Brother   . Hypertension Brother     Past Surgical History:  Procedure Laterality Date  . ABDOMINAL HYSTERECTOMY  1975   PARTIAL; part of 1 ovary remains  . CARDIAC CATHETERIZATION  10/08/2011   LAD-prox stent patent; no significant obstructive CAD; hi LVF end-diastolic pressure  . CARDIOVERSION N/A 05/12/2017   Procedure: CARDIOVERSION;  Surgeon: [name], MD;  Location: [facility] ENDOSCOPY;  Service: Cardiovascular;  Laterality: N/A;  . CATARACT EXTRACTION, BILATERAL Bilateral 08/2019   Dr. [name]  . COLONOSCOPY  03/21/2006   polyps (hyperplastic polyps), diverticula, internal hemorrhoids; Dr. Benson Norway  .  COLONOSCOPY  07/26/2011   diverticula, medium hemorrhoids; no rec f/u unless sx  . CORONARY ANGIOPLASTY WITH STENT PLACEMENT  2008   mid LAD  . IR GENERIC HISTORICAL  08/18/2016   IR RADIOLOGIST EVAL & MGMT 08/18/2016 [name], MD [facility] INTERV RAD  . IR RADIOLOGIST EVAL & MGMT  08/09/2018  . IR RADIOLOGIST EVAL & MGMT  09/19/2019  . KIDNEY SURGERY  2011   renal carcinoma - cryoablation -  Dr. Alinda Money and Dr. Laural Roes (cryoablation)  . SPINE SURGERY     Social History   Occupational History  . Occupation: retired Teacher, music: [employer]  Tobacco Use  .  Smoking status: Former Smoker    Quit date: 12/19/1968    Years since quitting: 52.0  . Smokeless tobacco: Never Used  Vaping Use  . Vaping Use: Never used  Substance and Sexual Activity  . Alcohol use: Yes    Alcohol/week: 2.0 standard drinks    Types: 2 Glasses of wine per week    Comment: 1-3 glasses of wine 5/week (occ martini)  . Drug use: No  . Sexual activity: Not Currently

## 2021-01-08 ENCOUNTER — Encounter: Payer: Self-pay | Admitting: Family Medicine

## 2021-01-08 DIAGNOSIS — T466X5A Adverse effect of antihyperlipidemic and antiarteriosclerotic drugs, initial encounter: Secondary | ICD-10-CM | POA: Insufficient documentation

## 2021-01-08 DIAGNOSIS — M791 Myalgia, unspecified site: Secondary | ICD-10-CM | POA: Insufficient documentation

## 2021-01-08 DIAGNOSIS — I700 Atherosclerosis of aorta: Secondary | ICD-10-CM | POA: Insufficient documentation

## 2021-01-11 DIAGNOSIS — H04123 Dry eye syndrome of bilateral lacrimal glands: Secondary | ICD-10-CM | POA: Diagnosis not present

## 2021-01-11 DIAGNOSIS — Z961 Presence of intraocular lens: Secondary | ICD-10-CM | POA: Diagnosis not present

## 2021-01-15 ENCOUNTER — Other Ambulatory Visit: Payer: Self-pay | Admitting: Family Medicine

## 2021-01-15 NOTE — Telephone Encounter (Signed)
Pt only has a month left

## 2021-01-15 NOTE — Telephone Encounter (Signed)
Will call pt to see if she needs a refill before her appt

## 2021-01-15 NOTE — Telephone Encounter (Signed)
Left message for pt to call me back 

## 2021-01-19 NOTE — Progress Notes (Signed)
Chief Complaint  Patient presents with  . wellness/ cpe    Theresa Mccarty is a 80 y.o. female who presents for annual physical exam, Medicare wellness visit and follow-up on chronic medical conditions.    She was recently seen with thoracic and LBP.  She was given robaxin and tramadol. She saw ortho the following day, who put her on a course of steroids, and recommended PT. She will be starting on 2/8.  That particular pain has resolved (right-sided), her "regular pain" persists.  Currently having some discomfort at her L shoulder blade.  Osteoporosis:  Last DEXA was in 04/2020. Lowest measured area was T-2.5 at L femoral neck. There was no change in her DEXA from the one done in 12/2017.  She was started back on weekly alendronate in 01/2018. She tolerates alendronate without any SE (no chest pain, dysphagia).  Due to lack of improvement, Prolia was suggested, but was expensive. Elected to stay on the alendronate until Prolia is more affordable (ie when [employer] has grant money). (She was previously treated with fosamax and Boniva. She had been off meds for at least8years. Her DEXA in 2009 was while still taking Boniva, and she had another DEXA 10/2012 which showed T-2.4 and decline in bone density at the left hip.)   DEXA 04/2020: ASSESSMENT: The BMD measured at Femur Neck Left is 0.695 g/cm2 with a T-score of -2.5. This patient is considered osteoporotic according to [facility]) criteria. The scan quality is good. L-3 and L-4 were excluded due to degenerative changes.  Site Region Measured Date Measured Age YA BMD Significant CHANGE T-score DualFemur Neck Left  05/11/[age].6         -2.5    0.695 g/cm2 DualFemur Neck Left  01/12/2018    76.3         -2.5    0.693 g/cm2  AP Spine  L1-L2      05/11/2020    [age]         -1.3    1.022 g/cm2 AP Spine  L1-L2      01/12/2018    76.3         -1.5    0.994 g/cm2  DualFemur Total Mean 05/11/2020    78.6          -1.9    0.765 g/cm2 DualFemur Total Mean 01/12/2018    76.3         -1.9    0.762 g/cm2  Hypothyroidism: Compliant with taking thyroid medications on an empty stomach, separate from other medications. She feels well, denies any thyroid-related symptoms. No hair/skin/bowel/mood/energy/weight changes. She is due to have this rechecked. Lab Results  Component Value Date   TSH 2.640 08/13/2019   Patient has CAD, hypertension, hyperlipidemia and atrial fibrillation, and is under the care of [name], last seen in 08/2020.   She has h/oCAD, s/p PCI of proximal LAD in 2008. She denies chest pain. [name] also manages her lipidsand atrial fibrillation.Failed cardioversion in 04/2017. She is on Diltiazem and Xarelto.She denies tachycardia, palpitations. She denies chest pain.  Denies any dyspnea when on the treadmill. She feels like this helps her atrial fibrillation (being on the treadmill).   She does sometimes note DOE with other activities--walking up stairs, and if walking a lot outside.  She has trouble breathing when wearing a mask (and feels like that makes her blood pressure go up). She denies significant bleeding or bruising(bruises easily, not any different).  Hyperlipidemia: She didn't tolerate atorvastatin or crestor (aching in joints and muscles). She reports compliance in taking Zetia, though this hasn't gotten her to goal (LDL<70) She tries to follow lowfat, low cholesterol diet. Lab Results  Component Value Date   CHOL 182 02/17/2020   HDL 95 02/17/2020   LDLCALC 77 02/17/2020   TRIG 49 02/17/2020   CHOLHDL 1.9 02/17/2020   Hypertension follow-up: Compliant with cardizem, valsartan HCT and metoprolol without any side effects. Blood pressure was elevated while on the steroids, and it is up when she wears a mask (today).  BP's are running 120's/80's after walking on the treadmill. Typically they run 130's/80's (but lower after exercise). BP's went up after the 2nd COVID  vaccine, so she isn't interested in getting the booster. Denies dizziness (slight when BP is high), headaches.   OSA--on CPAP; reports compliancewith use of CPAP. She feels refreshed in the mornings. Denies significant daytime somnolence(except when she has a poor night sleep due to insomnia, which is often). Insomnia--she sleeps soundly for 3 hours, then wakes up.  She may get back to sleep around 6:30 for 2 hours.  H/o Renal Cell Cancer, treated in 07/2010 with cryoablation. She is under the supervision of Dr. Alinda Money and Dr. Kathlene Cote. Last MRI was in 04/2020:  IMPRESSION: 1. Multiple benign appearing cystic lesions scattered throughout the pancreas, very similar in size and number to the prior examination, as detailed above. These are again favored to represent benign lesions such as side branch intraductal papillary mucinous neoplasm and/or pancreatic pseudocysts, as discussed above. Repeat abdominal MRI with and without IV gadolinium with MRCP is recommended in 6 months to ensure continued stability. This recommendation follows ACR consensus guidelines: Management of Incidental Pancreatic Cysts: A White Paper of the ACR Incidental Findings Committee. J Am Coll Radiol 2017;14:911-923. 2. Multiple Bosniak class 1 and Bosniak class 2 cysts in the kidneys bilaterally, similar to the prior study. Stable postprocedural changes of cryoablation in the upper pole the right kidney, without findings to suggest local recurrence of disease. 3. Aortic atherosclerosis.  She has seen Dr. Benson Norway for evaluation of the cysts in the pancreas. He said f/u 6 months to a year, doesn't have one scheduled yet.  Immunization History  Administered Date(s) Administered  . Fluad Quad(high Dose 65+) 09/16/2019, 09/08/2020  . Influenza, High Dose Seasonal PF 10/16/2013, 12/01/2014, 09/24/2015, 08/18/2016, 08/30/2017, 12/05/2018  . PFIZER(Purple Top)SARS-COV-2 Vaccination 02/21/2020, 03/18/2020  . Pneumococcal  Conjugate-13 08/06/2014  . Pneumococcal Polysaccharide-23 08/09/2012  . Td 01/04/2006  . Tdap 08/09/2012  . Zoster 02/16/2014   Last Pap smear: 2009; S/p hysterectomy in '75  Last mammogram: 04/2020 Last colonoscopy: 07/26/11, Dr. Benson Norway (diverticulosis and hemorrhoids) Last DEXA:  04/2020: T-2.5 at L fem neck Dentist: past due, many years. She has dental coverage, scheduled an appt, but had a conflict, hasn't yet rescheduled. Ophtho:Yearly, went last week. Exercise:Usual routine is walking on treadmill 4-7x/week, for 35-60 minutes. She admits doing much less in the past 2 months related to holidays, being busy. Lifts heavy flower arrangements/bowls 4x/week.  Has some handweights, only usually occasionally.  Excessive alcohol intake.  Admits it went up when her son was sick, daily in the summer.  Had none while on steroids recently.  She cut back on number of days drinking--no longer daily after work.  She will drink up to 3 glasses of wine, usually 1-2, and an occasional martini (had last night).   Other doctors caring for patient include: Dr. Alinda Money (urology) and Dr. Kathlene Cote  [name] (cardiology) Dr. Claiborne Billings for OSA(hasn't seen in a while) [facility]rel [facility]. Dr. [name] (ophtho) [facility] for hearing (had hearing aids) Doesn't have a dentist GI: Dr. Benson Norway Ortho: Dr. Randel Pigg office  Depression screen: negative Fall screen:none Functional Status survey:remarkable for issues with stairs related to back/knees. Mini-Cog screen: Normal  See full screens in epic  End of Life Discussion: Patient hasa living will and medical power of attorney, but hasn't yet gotten it notarized.  New forms were given last year. Hasn't had them notarized.   PMH, PSH, SH and FH were reviewed and updated  Outpatient Encounter Medications as of 01/20/2021  Medication Sig  . alendronate (FOSAMAX) 70 MG tablet TAKE 1 TABLET BY MOUTH EVERY 7 DAYS. TAKE WITH A FULL GLASS OF WATER ON AN EMPTY  STOMACH.  [name] aspirin EC 81 MG tablet Take 1 tablet (81 mg total) by mouth daily.  . B Complex Vitamins (B-COMPLEX/B-12 PO) Take 1 tablet by mouth daily.  . Calcium-Magnesium-Zinc 500-250-12.5 MG TABS Take 1 tablet by mouth daily.  . Cholecalciferol (VITAMIN D) 2000 units tablet Take 2,000 Units by mouth daily.  . Coenzyme Q10 (COQ10) 100 MG CAPS Take 1 tablet by mouth daily.   [name] diltiazem (CARDIZEM CD) 240 MG 24 hr capsule TAKE 1 CAPSULE BY MOUTH EVERY DAY  . ezetimibe (ZETIA) 10 MG tablet TAKE 1 TABLET BY MOUTH EVERY DAY  . levothyroxine (SYNTHROID) 50 MCG tablet TAKE 1 TABLET BY MOUTH EVERY DAY  . metoprolol succinate (TOPROL-XL) 25 MG 24 hr tablet Take 25 mg by mouth daily.  [name] MILK THISTLE PO Take 1 capsule by mouth daily.  . Probiotic Product (PROBIOTIC PO) Take 1 capsule by mouth daily.  . valsartan-hydrochlorothiazide (DIOVAN-HCT) 320-25 MG tablet TAKE 1 TABLET BY MOUTH EVERY DAY  . XARELTO 20 MG TABS tablet TAKE 1 TABLET BY MOUTH DAILY WITH SUPPER  . methocarbamol (ROBAXIN) 500 MG tablet Take 1-2 tablets (500-1,000 mg total) by mouth every 8 (eight) hours as needed for muscle spasms. (Patient not taking: Reported on 01/20/2021)  . nitroGLYCERIN (NITROSTAT) 0.4 MG SL tablet PLACE 1 TABLET (0.4 MG TOTAL) UNDER THE TONGUE EVERY 5 (FIVE) MINUTES AS NEEDED FOR CHEST PAIN. (Patient not taking: No sig reported)  . [DISCONTINUED] methylPREDNISolone (MEDROL) 4 MG tablet Take as directed (Patient not taking: Reported on 01/20/2021)   No facility-administered encounter medications on file as of 01/20/2021.   Allergies  Allergen Reactions  . Atorvastatin Other (See Comments)    Muscle aches  . Celebrex [Celecoxib] Other (See Comments)    Muscle aches  . Nitrofurantoin Nausea Only and Rash    ROS: The patient denies anorexia, fever, headaches, vision changes, ear pain, sore throat, breast concerns, palpitations, dizziness, syncope, dyspnea on exertion, cough, swelling, nausea, vomiting, diarrhea,  constipation, abdominal pain, melena, hematochezia, indigestion/heartburn, hematuria, incontinence, dysuria, vaginal bleeding, discharge, odor, genital lesions, new joint pains (chronic back, knees, wrists), numbness, tingling, weakness, tremor, suspicious skin lesions, depression, anxiety, abnormal bleeding or enlarged lymph nodes. +easy bruising (unchanged) +hearing loss-- hashearing aids. Mild allergies with weather changes, chronic runny nose from working with the flowers and dust. Occasional cough (thinks related to the dust, silk flowers) Some external vaginal itching (at hairline), chronic/intermittent. Up once or twice a night to void. Sleeps well for 3 hours, then wakes up, has trouble getting back to sleep. Back pain, chronic (scheduled for PT to start soon).   PHYSICAL EXAM:  BP (!) 142/86   Pulse 83   Temp  97.8 F (36.6 C)   Ht 5' 2.75" (1.594 m)   Wt 167 lb 9.6 oz (76 kg)   SpO2 98%   BMI 29.93 kg/m   Wt Readings from Last 3 Encounters:  01/20/21 167 lb 9.6 oz (76 kg)  01/06/21 161 lb 8 oz (73.3 kg)  09/14/20 163 lb (73.9 kg)    General Appearance:  Alert, cooperative, no distress, appears stated age   Head:  Normocephalic, without obvious abnormality, atraumatic   Eyes:  PERRL, conjunctiva/corneas clear, EOM's intact, fundibenign.  Ears:  Normal TM's and EAC's bilaterally  Nose:  Not examined, wearing mask due to COVID-19 pandemic   Throat:  Not examined, wearing mask due to COVID-19 pandemic  Neck:  Supple, no lymphadenopathy; thyroid: no enlargement/tenderness/nodules; no carotid bruit or JVD   Back:  Spine nontender, no curvature, ROM normal, no CVA tenderness   Lungs:  Clear to auscultation bilaterally without wheezes, rales or ronchi; respirations unlabored   Chest Wall:  No tenderness or deformity   Heart:  Rhythm seems somewhat regular, but had bouts of irregularity--ectopy vs atrial fibrillation.  Normal S1 and S2 normal, no murmur,  rub or gallop   Breast Exam:  No tenderness, masses, or nipple discharge or inversion. No axillary lymphadenopathy   Abdomen:  Soft, non-tender, nondistended, normoactive bowel sounds, no masses, no hepatosplenomegaly   Genitalia:  Normal external genitalia, some atrophic changes noted. No erythema, rash or lesions.  BUS and vagina normal. No abnormal vaginal discharge. Uterus surgically absent and adnexa not palpable--nontender, no masses. Pap not performed   Rectal:  Normal tone, no masses or tenderness; guaiac negative stool   Extremities:  No clubbing, cyanosis or edema.  Pulses:  2+ and symmetric all extremities   Skin:  Skin color, texture, turgor normal. Actinic changes to skin throughout; small ecchymosis on hands/forearmsand lower legs. Scab at right shin 10 x 52m eschar, no surrounding erythema or swelling. This is smaller than at recent visit, healing.    Lymph nodes:  Cervical, supraclavicular, and axillary nodes normal   Neurologic:  Normal strength, sensation and gait; reflexes 2+ and symmetric throughout            Psych:   Normal mood, affect, hygiene and grooming  PHQ-9 score of 3 (nearly every day having trouble sleeping, rest all negative).   ASSESSMENT/PLAN:  Annual physical exam  Medicare annual wellness visit, subsequent - Plan: POCT Urinalysis DIP (Proadvantage Device)  Osteoporosis, unspecified osteoporosis type, unspecified pathological fracture presence - cont alendronate. repeat DEXA in 04/2022.  If decline in bones, change to Prolia. Disc Ca, D and weight-bearing exercise  Hypothyroidism, unspecified type - euthyroid per hx, due for recheck TSH - Plan: TSH  Essential hypertension, benign - BP elevated in office, better at home. Monitor more regularly, and bring list to appt with Dr. [name]- Plan: POCT Urinalysis DIP (Proadvantage Device)  Pure hypercholesterolemia - on Zetia; to be rechecked by cardiology at next visit. Intolerant of  stating. LDL above goal but excellent HDL and ratio. Cont low cholesterol diet, zetia  OSA on CPAP - cont CPAP. Weight loss encouraged, and cutting back on alcohol  Persistent atrial fibrillation (HCC) - rate controlled, on anticoagulation  Coronary artery disease due to lipid rich plaque - asymptomatic/stable  Aortic atherosclerosis (HCC) - on cholesterol-lowering med, intolerant of statins  Myalgia due to statin - tried crestor and atorvastatin  Pancreatic cyst - has been stable. Will check with Dr. HBenson Norwayto see when next f/u is  due and to arrange it  Medication monitoring encounter  Need for hepatitis C screening test - Plan: Hepatitis C antibody  Vaccine counseling - encouraged COVID booster   Insomnia, unspecified type - counseled on sleep hygiene   Pt states [name] will do labs again even if we draw them now (as occurred last year)--so she declines getting CBC, c-met or lipids, will let him do those. Never had HepC screen, and due for TSH, so those are being done today.   Discussed monthly self breast exams and yearly mammograms; at least 30 minutes of aerobic activity at least 5 days/week, weight-bearing exercise 2x/week; proper sunscreen use reviewed; healthy diet, including goals of calcium and vitamin D intake and alcohol recommendations (less than or equal to 1 drink/day) reviewed; regular seatbelt use; changing batteries in smoke detectors. Immunization recommendations UTD, continue yearly high dose flu,shot. RecommendedShingrix, counseled re: side effects/efficacy, to get from pharmacy. COVID vaccine booster recommended.Tdap and pneumovax next year (Tdap from pharm). Colonoscopy recommendations reviewed, UTD   Pt reminded to get living [place] POA notarized.  Full Code, Full Care   Medicare Attestation I have personally reviewed: The patient's medical and social history Their use of alcohol, tobacco or illicit drugs Their current medications and  supplements The patient's functional ability including ADLs,fall risks, home safety risks, cognitive, and hearing and visual impairment Diet and physical activities Evidence for depression or mood disorders  The patient's weight, height, BMI have been recorded in the chart.  I have made referrals, counseling, and provided education to the patient based on review of the above and I have provided the patient with a written personalized care plan for preventive services.

## 2021-01-19 NOTE — Patient Instructions (Addendum)
HEALTH MAINTENANCE RECOMMENDATIONS:  It is recommended that you get at least 30 minutes of aerobic exercise at least 5 days/week (for weight loss, you may need as much as 60-90 minutes). This can be any activity that gets your heart rate up. This can be divided in 10-15 minute intervals if needed, but try and build up your endurance at least once a week.  Weight bearing exercise is also recommended twice weekly.  Eat a healthy diet with lots of vegetables, fruits and fiber.  "Colorful" foods have a lot of vitamins (ie green vegetables, tomatoes, red peppers, etc).  Limit sweet tea, regular sodas and alcoholic beverages, all of which has a lot of calories and sugar.  Up to 1 alcoholic drink daily may be beneficial for women (unless trying to lose weight, watch sugars).  Drink a lot of water.  Calcium recommendations are 1200-1500 mg daily (1500 mg for postmenopausal women or women without ovaries), and vitamin D 1000 IU daily.  This should be obtained from diet and/or supplements (vitamins), and calcium should not be taken all at once, but in divided doses.  Monthly self breast exams and yearly mammograms for women over the age of 59 is recommended.  Sunscreen of at least SPF 30 should be used on all sun-exposed parts of the skin when outside between the hours of 10 am and 4 pm (not just when at beach or pool, but even with exercise, golf, tennis, and yard work!)  Use a sunscreen that says "broad spectrum" so it covers both UVA and UVB rays, and make sure to reapply every 1-2 hours.  Remember to change the batteries in your smoke detectors when changing your clock times in the spring and fall. Carbon monoxide detectors are recommended for your home.  Use your seat belt every time you are in a car, and please drive safely and not be distracted with cell phones and texting while driving.   Theresa Mccarty , Thank you for taking time to come for your Medicare Wellness Visit. I appreciate your ongoing  commitment to your health goals. Please review the following plan we discussed and let me know if I can assist you in the future.    This is a list of the screening recommended for you and due dates:  Health Maintenance  Topic Date Due  .  Hepatitis C: One time screening is recommended by Center for Disease Control  (CDC) for  adults born from [birth year] through 1965.   Never done  . COVID-19 Vaccine (3 - Pfizer risk 4-dose series) 04/15/2020  . Tetanus Vaccine  08/09/2022  . Flu Shot  Completed  . DEXA scan (bone density measurement)  Completed  . Pneumonia vaccines  Completed   They changed the guidelines for Hepatitis C screening, now recommending it for all adults. We are checking this with your labs today.  Your next tetanus shot will be due next year (you will need to get this from the pharmacy). I also recommend getting a pneumovax booster next year (we can give that in the office).  Get [place] copies of your living will and healthcare power of attorney, at your convenience once they have been notarized.  I recommend getting the new shingles vaccine (Shingrix). Since you have Medicare, you will need to get this from the pharmacy, as it is covered by Part D. This is a series of 2 injections, spaced 2 months apart. This should either be given the same day as another vaccine, or given at  least 2 weeks apart.  Please limit your wine intake to just 1 glass/day (and preferably not daily). BP was above goal today.  Monitor more regularly at home and bring the list to [name] when you see him.    Insomnia Insomnia is a sleep disorder that makes it difficult to fall asleep or stay asleep. Insomnia can cause fatigue, low energy, difficulty concentrating, mood swings, and poor performance at work or school. There are three different ways to classify insomnia:  Difficulty falling asleep.  Difficulty staying asleep.  Waking up too early in the morning. Any type of insomnia can be long-term  (chronic) or short-term (acute). Both are common. Short-term insomnia usually lasts for three months or less. Chronic insomnia occurs at least three times a week for longer than three months. What are the causes? Insomnia may be caused by another condition, situation, or substance, such as:  Anxiety.  Certain medicines.  Gastroesophageal reflux disease (GERD) or other gastrointestinal conditions.  Asthma or other breathing conditions.  Restless legs syndrome, sleep apnea, or other sleep disorders.  Chronic pain.  Menopause.  Stroke.  Abuse of alcohol, tobacco, or illegal drugs.  Mental health conditions, such as depression.  Caffeine.  Neurological disorders, such as Alzheimer's disease.  An overactive thyroid (hyperthyroidism). Sometimes, the cause of insomnia may not be known. What increases the risk? Risk factors for insomnia include:  Gender. Women are affected more often than men.  Age. Insomnia is more common as you get older.  Stress.  Lack of exercise.  Irregular work schedule or working night shifts.  Traveling between different time zones.  Certain medical and mental health conditions. What are the signs or symptoms? If you have insomnia, the main symptom is having trouble falling asleep or having trouble staying asleep. This may lead to other symptoms, such as:  Feeling fatigued or having low energy.  Feeling nervous about going to sleep.  Not feeling rested in the morning.  Having trouble concentrating.  Feeling irritable, anxious, or depressed. How is this diagnosed? This condition may be diagnosed based on:  Your symptoms and medical history. Your health care provider may ask about: ? Your sleep habits. ? Any medical conditions you have. ? Your mental health.  A physical exam. How is this treated? Treatment for insomnia depends on the cause. Treatment may focus on treating an underlying condition that is causing insomnia. Treatment may  also include:  Medicines to help you sleep.  Counseling or therapy.  Lifestyle adjustments to help you sleep better. Follow these instructions at home: Eating and drinking  Limit or avoid alcohol, caffeinated beverages, and cigarettes, especially close to bedtime. These can disrupt your sleep.  Do not eat a large meal or eat spicy foods right before bedtime. This can lead to digestive discomfort that can make it hard for you to sleep.   Sleep habits  Keep a sleep diary to help you and your health care provider figure out what could be causing your insomnia. Write down: ? When you sleep. ? When you wake up during the night. ? How well you sleep. ? How rested you feel the next day. ? Any side effects of medicines you are taking. ? What you eat and drink.  Make your bedroom a dark, comfortable place where it is easy to fall asleep. ? Put up shades or blackout curtains to block light from outside. ? Use a white noise machine to block noise. ? Keep the temperature cool.  Limit screen  use before bedtime. This includes: ? Watching TV. ? Using your smartphone, tablet, or computer.  Stick to a routine that includes going to bed and waking up at the same times every day and night. This can help you fall asleep faster. Consider making a quiet activity, such as reading, part of your nighttime routine.  Try to avoid taking naps during the day so that you sleep better at night.  Get out of bed if you are still awake after 15 minutes of trying to sleep. Keep the lights down, but try reading or doing a quiet activity. When you feel sleepy, go back to bed.   General instructions  Take over-the-counter and prescription medicines only as told by your health care provider.  Exercise regularly, as told by your health care provider. Avoid exercise starting several hours before bedtime.  Use relaxation techniques to manage stress. Ask your health care provider to suggest some techniques that may  work well for you. These may include: ? Breathing exercises. ? Routines to release muscle tension. ? Visualizing peaceful scenes.  Make sure that you drive carefully. Avoid driving if you feel very sleepy.  Keep all follow-up visits as told by your health care provider. This is important. Contact a health care provider if:  You are tired throughout the day.  You have trouble in your daily routine due to sleepiness.  You continue to have sleep problems, or your sleep problems get worse. Get help right away if:  You have serious thoughts about hurting yourself or someone else. If you ever feel like you may hurt yourself or others, or have thoughts about taking your own life, get help right away. You can go to your nearest emergency department or call:  Your local emergency services (911 in the [place]  A suicide crisis helpline, such as the [place] at 6691287370. This is open 24 hours a day. Summary  Insomnia is a sleep disorder that makes it difficult to fall asleep or stay asleep.  Insomnia can be long-term (chronic) or short-term (acute).  Treatment for insomnia depends on the cause. Treatment may focus on treating an underlying condition that is causing insomnia.  Keep a sleep diary to help you and your health care provider figure out what could be causing your insomnia. This information is not intended to replace advice given to you by your health care provider. Make sure you discuss any questions you have with your health care provider. Document Revised: 10/15/2020 Document Reviewed: 10/15/2020 Elsevier Patient Education  2021 [employer].

## 2021-01-20 ENCOUNTER — Encounter: Payer: Self-pay | Admitting: Family Medicine

## 2021-01-20 ENCOUNTER — Other Ambulatory Visit: Payer: Self-pay

## 2021-01-20 ENCOUNTER — Ambulatory Visit (INDEPENDENT_AMBULATORY_CARE_PROVIDER_SITE_OTHER): Payer: Medicare Other | Admitting: Family Medicine

## 2021-01-20 VITALS — BP 142/86 | HR 83 | Temp 97.8°F | Ht 62.75 in | Wt 167.6 lb

## 2021-01-20 DIAGNOSIS — E039 Hypothyroidism, unspecified: Secondary | ICD-10-CM

## 2021-01-20 DIAGNOSIS — Z0000 Encounter for general adult medical examination without abnormal findings: Secondary | ICD-10-CM | POA: Diagnosis not present

## 2021-01-20 DIAGNOSIS — G4700 Insomnia, unspecified: Secondary | ICD-10-CM

## 2021-01-20 DIAGNOSIS — Z9989 Dependence on other enabling machines and devices: Secondary | ICD-10-CM

## 2021-01-20 DIAGNOSIS — M810 Age-related osteoporosis without current pathological fracture: Secondary | ICD-10-CM

## 2021-01-20 DIAGNOSIS — Z7185 Encounter for immunization safety counseling: Secondary | ICD-10-CM

## 2021-01-20 DIAGNOSIS — I4819 Other persistent atrial fibrillation: Secondary | ICD-10-CM | POA: Diagnosis not present

## 2021-01-20 DIAGNOSIS — I10 Essential (primary) hypertension: Secondary | ICD-10-CM | POA: Diagnosis not present

## 2021-01-20 DIAGNOSIS — G4733 Obstructive sleep apnea (adult) (pediatric): Secondary | ICD-10-CM

## 2021-01-20 DIAGNOSIS — Z1159 Encounter for screening for other viral diseases: Secondary | ICD-10-CM | POA: Diagnosis not present

## 2021-01-20 DIAGNOSIS — I700 Atherosclerosis of aorta: Secondary | ICD-10-CM | POA: Diagnosis not present

## 2021-01-20 DIAGNOSIS — E7800 Pure hypercholesterolemia, unspecified: Secondary | ICD-10-CM | POA: Diagnosis not present

## 2021-01-20 DIAGNOSIS — M791 Myalgia, unspecified site: Secondary | ICD-10-CM

## 2021-01-20 DIAGNOSIS — Z5181 Encounter for therapeutic drug level monitoring: Secondary | ICD-10-CM | POA: Diagnosis not present

## 2021-01-20 DIAGNOSIS — I2510 Atherosclerotic heart disease of native coronary artery without angina pectoris: Secondary | ICD-10-CM

## 2021-01-20 DIAGNOSIS — K862 Cyst of pancreas: Secondary | ICD-10-CM | POA: Diagnosis not present

## 2021-01-20 DIAGNOSIS — I2583 Coronary atherosclerosis due to lipid rich plaque: Secondary | ICD-10-CM

## 2021-01-20 DIAGNOSIS — T466X5A Adverse effect of antihyperlipidemic and antiarteriosclerotic drugs, initial encounter: Secondary | ICD-10-CM

## 2021-01-20 LAB — POCT URINALYSIS DIP (PROADVANTAGE DEVICE)
Blood, UA: NEGATIVE
Glucose, UA: NEGATIVE mg/dL
Ketones, POC UA: NEGATIVE mg/dL
Leukocytes, UA: NEGATIVE
Nitrite, UA: NEGATIVE
Protein Ur, POC: 30 mg/dL — AB
Specific Gravity, Urine: 1.015
Urobilinogen, Ur: 0.2
pH, UA: 6 (ref 5.0–8.0)

## 2021-01-21 LAB — HEPATITIS C ANTIBODY: Hep C Virus Ab: <0.1 s/co ratio (ref 0.0–0.9)

## 2021-01-21 LAB — TSH: TSH: 1.91 u[IU]/mL (ref 0.450–4.500)

## 2021-01-24 ENCOUNTER — Other Ambulatory Visit: Payer: Self-pay | Admitting: Internal Medicine

## 2021-01-24 ENCOUNTER — Other Ambulatory Visit: Payer: Self-pay | Admitting: Cardiovascular Disease

## 2021-01-26 DIAGNOSIS — M47896 Other spondylosis, lumbar region: Secondary | ICD-10-CM | POA: Diagnosis not present

## 2021-01-26 DIAGNOSIS — M546 Pain in thoracic spine: Secondary | ICD-10-CM | POA: Diagnosis not present

## 2021-02-04 ENCOUNTER — Ambulatory Visit: Payer: Medicare Other | Admitting: Physician Assistant

## 2021-02-19 ENCOUNTER — Encounter: Payer: Self-pay | Admitting: Family Medicine

## 2021-02-25 ENCOUNTER — Ambulatory Visit: Payer: Medicare Other | Admitting: Family Medicine

## 2021-02-27 ENCOUNTER — Other Ambulatory Visit: Payer: Self-pay | Admitting: Family Medicine

## 2021-02-27 DIAGNOSIS — E039 Hypothyroidism, unspecified: Secondary | ICD-10-CM

## 2021-03-01 ENCOUNTER — Encounter: Payer: Self-pay | Admitting: Family Medicine

## 2021-03-05 ENCOUNTER — Encounter: Payer: Self-pay | Admitting: Family Medicine

## 2021-03-05 ENCOUNTER — Other Ambulatory Visit: Payer: Self-pay

## 2021-03-05 ENCOUNTER — Ambulatory Visit (INDEPENDENT_AMBULATORY_CARE_PROVIDER_SITE_OTHER): Payer: Medicare Other | Admitting: Family Medicine

## 2021-03-05 VITALS — BP 130/86 | HR 76 | Temp 98.1°F | Wt 166.6 lb

## 2021-03-05 DIAGNOSIS — K921 Melena: Secondary | ICD-10-CM | POA: Diagnosis not present

## 2021-03-05 DIAGNOSIS — R197 Diarrhea, unspecified: Secondary | ICD-10-CM | POA: Diagnosis not present

## 2021-03-05 LAB — HEMOCCULT GUIAC POC 1CARD (OFFICE): Fecal Occult Blood, POC: NEGATIVE

## 2021-03-05 NOTE — Progress Notes (Signed)
   Subjective:    Patient ID: Theresa Mccarty, female    DOB: 1941-04-22, 80 y.o.   MRN: [id number]  HPI Chief Complaint  Patient presents with  . dark stool    Dark stool- diarrhea for x 5 weeks. Muddy black stools beginning of diarrhea,   She is here with complaints of a 5 week history of black and loose stools.she also reports bloating and increased flatulence. Flatulence has improved.  Reports having only 1 episode of diarrhea which is an improvement to the 3-4 times per day she was initially experiencing.  States she typically has a bowel movement every morning after drinking coffee. Denies taking Pepto-Bismol, iron or any other new medications. States her stool was tested in January for blood and it was negative.  This was at her PCP appointment.  She is on Xarelto for A. fib.  Denies bleeding.  Last colonoscopy 2012.  She sees Dr. Benson Norway.  States one day in the past week she had an episode of dizziness and her BP was very high.  This has resolved  Denies fever, chills, chest pain, palpitations, shortness of breath, abdominal pain, N/V/D, urinary symptoms.  Reviewed allergies, medications, past medical, surgical, family, and social history.    Review of Systems Pertinent positives and negatives in the history of present illness.     Objective:   Physical Exam BP 130/86   Pulse 76   Temp 98.1 F (36.7 C)   Wt 166 lb 9.6 oz (75.6 kg)   BMI 29.75 kg/m   Alert and in no distress. Cardiac exam shows a RRR. Lungs are clear to auscultation.  Abdomen is soft, nondistended, nontender, normal bowel sounds, no guarding or palpable masses.  Rectal exam shows external hemorrhoid, normal tone, black stool but negative guaiac.  Skin is warm and dry, no pallor.      Assessment & Plan:  Black stools - Plan: CBC with Differential/Platelet, Comprehensive metabolic panel  Diarrhea, unspecified type - Plan: POCT occult blood stool, CBC with Differential/Platelet, Comprehensive metabolic  panel  No red flag symptoms.  Diarrhea seems to be improving per patient.  Now only having 1 occurrence daily and the stool is more formed.  Negative Hemoccult. Discussed good hydration, getting plenty of fiber in her diet. She reports having an upcoming appointment with her GI, Dr. Benson Norway, and she will bring this to his attention.  She will call back if the diarrhea increases in frequency or she has any new concerns.  Discussed the possibility of stool studies but did not do these since her diarrhea has improved significantly.

## 2021-03-06 LAB — CBC WITH DIFFERENTIAL/PLATELET
Basophils Absolute: 0 10*3/uL (ref 0.0–0.2)
Basos: 1 %
EOS (ABSOLUTE): 0.1 10*3/uL (ref 0.0–0.4)
Eos: 1 %
Hematocrit: 40.7 % (ref 34.0–46.6)
Hemoglobin: 14 g/dL (ref 11.1–15.9)
Immature Grans (Abs): 0 10*3/uL (ref 0.0–0.1)
Immature Granulocytes: 0 %
Lymphocytes Absolute: 1.4 10*3/uL (ref 0.7–3.1)
Lymphs: 21 %
MCH: 33.3 pg — ABNORMAL HIGH (ref 26.6–33.0)
MCHC: 34.4 g/dL (ref 31.5–35.7)
MCV: 97 fL (ref 79–97)
Monocytes Absolute: 0.6 10*3/uL (ref 0.1–0.9)
Monocytes: 9 %
Neutrophils Absolute: 4.7 10*3/uL (ref 1.4–7.0)
Neutrophils: 68 %
Platelets: 288 10*3/uL (ref 150–450)
RBC: 4.2 x10E6/uL (ref 3.77–5.28)
RDW: 12.3 % (ref 11.7–15.4)
WBC: 6.9 10*3/uL (ref 3.4–10.8)

## 2021-03-06 LAB — COMPREHENSIVE METABOLIC PANEL
ALT: 15 IU/L (ref 0–32)
AST: 20 IU/L (ref 0–40)
Albumin/Globulin Ratio: 2.2 (ref 1.2–2.2)
Albumin: 4.6 g/dL (ref 3.7–4.7)
Alkaline Phosphatase: 74 IU/L (ref 44–121)
BUN/Creatinine Ratio: 23 (ref 12–28)
BUN: 18 mg/dL (ref 8–27)
Bilirubin Total: 0.4 mg/dL (ref 0.0–1.2)
CO2: 20 mmol/L (ref 20–29)
Calcium: 9.5 mg/dL (ref 8.7–10.3)
Chloride: 96 mmol/L (ref 96–106)
Creatinine, Ser: 0.79 mg/dL (ref 0.57–1.00)
Globulin, Total: 2.1 g/dL (ref 1.5–4.5)
Glucose: 86 mg/dL (ref 65–99)
Potassium: 3.8 mmol/L (ref 3.5–5.2)
Sodium: 135 mmol/L (ref 134–144)
Total Protein: 6.7 g/dL (ref 6.0–8.5)
eGFR: 76 mL/min/{1.73_m2} (ref >59)

## 2021-03-18 ENCOUNTER — Other Ambulatory Visit: Payer: Self-pay | Admitting: Internal Medicine

## 2021-03-18 NOTE — Telephone Encounter (Signed)
89f, 75.6kg, scr 0.79(03/05/21), lovw/hilty(09/14/20), ccr31mlmin

## 2021-03-29 ENCOUNTER — Telehealth: Payer: Self-pay | Admitting: Cardiovascular Disease

## 2021-03-29 NOTE — Telephone Encounter (Signed)
4.11.22 Spoke to pt regarding fu appt w.Dr Claiborne Billings. Offered several virtual visit dates. She will call back when she checks her schedule. LP

## 2021-03-31 DIAGNOSIS — R197 Diarrhea, unspecified: Secondary | ICD-10-CM | POA: Diagnosis not present

## 2021-03-31 DIAGNOSIS — K862 Cyst of pancreas: Secondary | ICD-10-CM | POA: Diagnosis not present

## 2021-03-31 DIAGNOSIS — R933 Abnormal findings on diagnostic imaging of other parts of digestive tract: Secondary | ICD-10-CM | POA: Diagnosis not present

## 2021-04-01 ENCOUNTER — Other Ambulatory Visit: Payer: Self-pay | Admitting: Gastroenterology

## 2021-04-01 DIAGNOSIS — K862 Cyst of pancreas: Secondary | ICD-10-CM

## 2021-04-12 ENCOUNTER — Other Ambulatory Visit: Payer: Self-pay | Admitting: Family Medicine

## 2021-04-14 ENCOUNTER — Other Ambulatory Visit: Payer: Self-pay | Admitting: Internal Medicine

## 2021-04-14 DIAGNOSIS — E785 Hyperlipidemia, unspecified: Secondary | ICD-10-CM

## 2021-04-18 ENCOUNTER — Ambulatory Visit
Admission: RE | Admit: 2021-04-18 | Discharge: 2021-04-18 | Disposition: A | Discharge status: Home / Self Care | Payer: Medicare Other | Source: Ambulatory Visit | Attending: Gastroenterology | Admitting: Gastroenterology

## 2021-04-18 ENCOUNTER — Other Ambulatory Visit: Payer: Self-pay

## 2021-04-18 DIAGNOSIS — N281 Cyst of kidney, acquired: Secondary | ICD-10-CM | POA: Diagnosis not present

## 2021-04-18 DIAGNOSIS — K862 Cyst of pancreas: Secondary | ICD-10-CM

## 2021-04-18 DIAGNOSIS — K7689 Other specified diseases of liver: Secondary | ICD-10-CM | POA: Diagnosis not present

## 2021-04-18 DIAGNOSIS — K760 Fatty (change of) liver, not elsewhere classified: Secondary | ICD-10-CM | POA: Diagnosis not present

## 2021-04-18 MED ORDER — GADOBENATE DIMEGLUMINE 529 MG/ML IV SOLN
15.0000 mL | Freq: Once | INTRAVENOUS | Status: AC | PRN
  Administered 2021-04-18: 15 mL via INTRAVENOUS

## 2021-04-22 ENCOUNTER — Ambulatory Visit: Payer: Medicare Other | Admitting: Cardiovascular Disease

## 2021-05-06 ENCOUNTER — Ambulatory Visit: Payer: Medicare Other | Admitting: Cardiovascular Disease

## 2021-05-06 ENCOUNTER — Other Ambulatory Visit: Payer: Self-pay

## 2021-05-06 ENCOUNTER — Encounter: Payer: Self-pay | Admitting: Cardiovascular Disease

## 2021-05-06 ENCOUNTER — Telehealth: Payer: Self-pay | Admitting: *Deleted

## 2021-05-06 VITALS — BP 142/76 | HR 64 | Ht 62.5 in | Wt 165.0 lb

## 2021-05-06 DIAGNOSIS — I10 Essential (primary) hypertension: Secondary | ICD-10-CM | POA: Diagnosis not present

## 2021-05-06 DIAGNOSIS — E039 Hypothyroidism, unspecified: Secondary | ICD-10-CM | POA: Diagnosis not present

## 2021-05-06 DIAGNOSIS — Z9989 Dependence on other enabling machines and devices: Secondary | ICD-10-CM

## 2021-05-06 DIAGNOSIS — Z9861 Coronary angioplasty status: Secondary | ICD-10-CM | POA: Diagnosis not present

## 2021-05-06 DIAGNOSIS — Z7901 Long term (current) use of anticoagulants: Secondary | ICD-10-CM | POA: Diagnosis not present

## 2021-05-06 DIAGNOSIS — G4733 Obstructive sleep apnea (adult) (pediatric): Secondary | ICD-10-CM

## 2021-05-06 DIAGNOSIS — I2510 Atherosclerotic heart disease of native coronary artery without angina pectoris: Secondary | ICD-10-CM

## 2021-05-06 DIAGNOSIS — E785 Hyperlipidemia, unspecified: Secondary | ICD-10-CM

## 2021-05-06 NOTE — Patient Instructions (Signed)
Medication Instructions:  Your physician recommends that you continue on your current medications as directed. Please refer to the Current Medication list given to you today.  *If you need a refill on your cardiac medications before your next appointment, please call your pharmacy*   Lab Work: None ordered.    Testing/Procedures: None ordered.    Follow-Up: At [facility], you and your health needs are our priority.  As part of our continuing mission to provide you with exceptional heart care, we have created designated Provider Care Teams.  These Care Teams include your primary Cardiologist (physician) and Advanced Practice Providers (APPs -  Physician Assistants and Nurse Practitioners) who all work together to provide you with the care you need, when you need it.  We recommend signing up for the patient portal called "MyChart".  Sign up information is provided on this After Visit Summary.  MyChart is used to connect with patients for Virtual Visits (Telemedicine).  Patients are able to view lab/test results, encounter notes, upcoming appointments, etc.  Non-urgent messages can be sent to your provider as well.   To learn more about what you can do with MyChart, go to [email or web address].    Your next appointment:   12 month(s)  The format for your next appointment:   In Person  Provider:   [name], MD   Other Instructions Please continue to monitor your blood pressure.

## 2021-05-06 NOTE — Telephone Encounter (Signed)
Per Dr Claiborne Billings verbal Order, request for [id number] mask faxed to [facility].

## 2021-05-06 NOTE — Progress Notes (Signed)
Cardiology Office Note    Date:  05/13/2021   ID:  Theresa Mccarty, DOB 10/18/1941, MRN [id number]  PCP:  [name], MD  Cardiologist:  [name], MD   F/U sleep clinic evaluation  History of Present Illness:  Theresa Mccarty is a 80 y.o. female who is followed by [name] for her cardiology care.  She has a history of hypertension, hyperlipidemia, CAD, as well as obstructive sleep apnea.  She presents for a 63-monthfollow-up sleep evaluation.  Ms. THickeyhas a history of sleep apnea dating back to 2009 when a polysomnogram revealed mild to moderate obstructive sleep apnea overall with an AHI of 14.4/h; however during REM sleep, sleep apnea was severe with AHI 41.7/h.  She had significant oxygen desaturation to a nadir of 83%.  She has been on CPAP therapy since that time and did well until 2015 when her CPAP machine began to malfunction.  She received a new ResMed air sense 10 AutoSet CPAP unit in early 2015 and I saw her in March 2015 for her compliance assessment at which time usage was 100% and noted marked benefit with her new machine compared to her old one.  At that time there was no residual daytime sleepiness.  When I saw her in March 2021, I had not seen her since 2015.  She has been followed by Dr. [name]  She has had significant issues during this COVID-19 pandemic.  At the end of 2020, apparently her machine was setting a signal of expiration.  She subsequently received a new replacement CPAP ResMed air sense 10 unit with set up date on December 10, 2019.  She has been using her new machine since and continues to be 100% compliant.  A download was obtained in the office today from February 04, 2020 through March 04, 2020 which shows average use of 10 hours and 2 minutes per night.  At her 12 cm set pressure, AHI is excellent at 0.6.  She has had difficulty with humidification and oftentimes notes water in her mask and tube.  With her new machine she denied residual daytime  sleepiness.  An Epworth Sleepiness Scale score was calculated in the office today and this endorsed at 0.  She has history of hypertension and has been on diltiazem 240 mg daily in addition to valsartan HCT 320/25 mg.  There is a history of atrial fibrillation which is persistent for which she is on anticoagulation therapy with Xarelto.  She has hypothyroidism on levothyroxine and is on Zetia for hyperlipidemia.    Since I last saw her, over the past year she has had issues with weight and difficulty with weight loss.  She has continued to use CPAP therapy not sleep without it.  A download was obtained from April 07, 2021 through May 06, 2021 which shows excellent compliance with average use at 10 hours and 27 minutes.  There is a mask leak.  At 12 cm set pressure, AHI is excellent at 0.5.  Epworth Sleepiness Scale scale was calculated in the office today and this endorsed at 1 arguing against residual daytime sleepiness.  She states her blood pressure at home has been labile and at times can range from 116 up to 160.  He is on diltiazem 240 mg daily, metoprolol succinate 25 mg, and valsartan HCT 320/25 mg daily.  She is on Xarelto 20 mg daily.  She continues to be on levothyroxine 50 mcg.  She is on Zetia for hyperlipidemia.  She presents for evaluation.  Past Medical History:  Diagnosis Date  . Atrial fibrillation ([place])    a. initially diagnosed in 02/2017. Started on Eliquis  . Back pain, chronic   . Broken leg 1958  . CAD (coronary artery disease)    a. s/p DES to LAD in 2008, patent by cath in 2012.  [name] Hearing loss    bilateral hearing aids  . History of echocardiogram 09/11/2007   normal  . History of nuclear stress test 11/17/2010   exercise; small fixed anteroseptal defect (?artifact); short run of atrial-tachy during recovery; low risk  . Hyperlipidemia   . Hypertension   . Hypothyroid   . Impaired fasting glucose   . Osteoporosis   . Renal cell carcinoma right; 2011   Dr. Alinda Money,  [facility]  . Sleep apnea    on CPAP    Past Surgical History:  Procedure Laterality Date  . ABDOMINAL HYSTERECTOMY  1975   PARTIAL; part of 1 ovary remains  . CARDIAC CATHETERIZATION  10/08/2011   LAD-prox stent patent; no significant obstructive CAD; hi LVF end-diastolic pressure  . CARDIOVERSION N/A 05/12/2017   Procedure: CARDIOVERSION;  Surgeon: [name], MD;  Location: [facility]COPY;  Service: Cardiovascular;  Laterality: N/A;  . CATARACT EXTRACTION, BILATERAL Bilateral 08/2019   Dr. [name]  . COLONOSCOPY  03/21/2006   polyps (hyperplastic polyps), diverticula, internal hemorrhoids; Dr. Benson Norway  . COLONOSCOPY  07/26/2011   diverticula, medium hemorrhoids; no rec f/u unless sx  . CORONARY ANGIOPLASTY WITH STENT PLACEMENT  2008   mid LAD  . IR GENERIC HISTORICAL  08/18/2016   IR RADIOLOGIST EVAL & MGMT 08/18/2016 [name], MD [facility] INTERV RAD  . IR RADIOLOGIST EVAL & MGMT  08/09/2018  . IR RADIOLOGIST EVAL & MGMT  09/19/2019  . KIDNEY SURGERY  2011   renal carcinoma - cryoablation -  Dr. Alinda Money and Dr. Laural Roes (cryoablation)  . SPINE SURGERY      Current Medications: Outpatient Medications Prior to Visit  Medication Sig Dispense Refill  . alendronate (FOSAMAX) 70 MG tablet TAKE 1 TABLET BY MOUTH EVERY 7 DAYS. TAKE WITH A FULL GLASS OF WATER ON AN EMPTY STOMACH. 12 tablet 1  . aspirin EC 81 MG tablet Take 1 tablet (81 mg total) by mouth daily. 90 tablet 3  . B Complex Vitamins (B-COMPLEX/B-12 PO) Take 1 tablet by mouth daily.    . Cholecalciferol (VITAMIN D) 2000 units tablet Take 2,000 Units by mouth daily.    . Coenzyme Q10 (COQ10) 100 MG CAPS Take 1 tablet by mouth daily.     [name] diltiazem (CARDIZEM CD) 240 MG 24 hr capsule TAKE 1 CAPSULE BY MOUTH EVERY DAY 90 capsule 2  . ezetimibe (ZETIA) 10 MG tablet TAKE 1 TABLET BY MOUTH EVERY DAY 90 tablet 3  . levothyroxine (SYNTHROID) 50 MCG tablet TAKE 1 TABLET BY MOUTH EVERY DAY 90 tablet 3  . metoprolol succinate  (TOPROL-XL) 25 MG 24 hr tablet TAKE 1/2 TAB DAILY FOR 1 TO 2 WEEKS. IF STILL HAVING INCREASED HEART RATE & BP MAY INCREASE TO 1 TAB 90 tablet 3  . MILK THISTLE PO Take 1 capsule by mouth daily.    . nitroGLYCERIN (NITROSTAT) 0.4 MG SL tablet PLACE 1 TABLET (0.4 MG TOTAL) UNDER THE TONGUE EVERY 5 (FIVE) MINUTES AS NEEDED FOR CHEST PAIN. 25 tablet 4  . Probiotic Product (PROBIOTIC PO) Take 1 capsule by mouth daily.    . valsartan-hydrochlorothiazide (DIOVAN-HCT) 320-25 MG tablet TAKE 1  TABLET BY MOUTH EVERY DAY 90 tablet 3  . XARELTO 20 MG TABS tablet TAKE 1 TABLET BY MOUTH DAILY WITH SUPPER 90 tablet 1  . Calcium-Magnesium-Zinc 500-250-12.5 MG TABS Take 1 tablet by mouth daily.    . Zinc 100 MG TABS      No facility-administered medications prior to visit.     Allergies:   Atorvastatin, Celebrex [celecoxib], and Nitrofurantoin   Social History   Socioeconomic History  . Marital status: Married    Spouse name: Not on file  . Number of children: 2  . Years of education: Not on file  . Highest education level: Not on file  Occupational History  . Occupation: retired Teacher, music: [employer]  Tobacco Use  . Smoking status: Former Smoker    Quit date: 12/19/1968    Years since quitting: 52.4  . Smokeless tobacco: Never Used  Vaping Use  . Vaping Use: Never used  Substance and Sexual Activity  . Alcohol use: Yes    Alcohol/week: 2.0 standard drinks    Types: 2 Glasses of wine per week    Comment: 1-3 glasses of wine 3/week (occ martini)  . Drug use: No  . Sexual activity: Not Currently  Other Topics Concern  . Not on file  Social History Narrative   Lives with husband.  2 sons live near [place]. Retired from working for [employer] 02/2016. 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   Updated 11/2019   Social Determinants of Health   Financial Resource Strain: Not on file  Food Insecurity: Not on file  Transportation Needs: Not on file  Physical Activity: Not on file  Stress: Not on file  Social Connections: Not on file     Family History:  The patient's  'family history includes Arthritis in her mother; Cancer in her sister and sister; Depression in her sister; Diabetes in her brother; Heart disease in her father and mother; Heart disease (age of onset: 75) in her son; Hyperlipidemia in her brother, sister, and son; Hypertension in her brother and mother; Osteoporosis in her mother.   ROS General: Negative; No fevers, chills, or night sweats;  HEENT: Negative; No changes in vision or hearing, sinus congestion, difficulty swallowing Pulmonary: Negative; No cough, wheezing, shortness of breath, hemoptysis Cardiovascular: Positive for atrial fibrillation, hypertension, hyperlipidemia, and CAD. GI: Negative; No nausea, vomiting, diarrhea, or abdominal pain GU: Negative; No dysuria, hematuria, or difficulty voiding Musculoskeletal: Negative; no myalgias, joint pain, or weakness Hematologic/Oncology: Negative; no easy bruising, bleeding Endocrine: Negative; no heat/cold intolerance; no diabetes Neuro: Negative; no changes in balance, headaches Skin: Negative; No rashes or skin lesions Psychiatric: Negative; No behavioral problems, depression Sleep: OSA on CPAP therapy since 2009.  No snoring, daytime sleepiness, hypersomnolence, bruxism, restless legs, hypnogognic hallucinations, no cataplexy Other comprehensive 14 point system review is negative.   PHYSICAL EXAM:   VS:  BP (!) 142/76   Pulse 64   Ht 5' 2.5" (1.588 m)   Wt 165 lb (74.8 kg)   SpO2 98%   BMI 29.70 kg/m    Blood pressure by me 144/80  Wt Readings from Last 3 Encounters:  05/06/21 165 lb (74.8 kg)  03/05/21 166  lb 9.6 oz (75.6 kg)  01/20/21 167 lb 9.6 oz (76 kg)    General: Alert, oriented, no distress.  Skin: normal turgor, no rashes, warm and dry HEENT: Normocephalic, atraumatic. Pupils equal round and reactive to light; sclera anicteric; extraocular muscles intact;  Nose without nasal septal hypertrophy Mouth/Parynx benign; Mallinpatti scale 3 Neck: No JVD, no carotid bruits; normal carotid upstroke Lungs: clear to ausculatation and percussion; no wheezing or rales Chest wall: without tenderness to palpitation Heart: PMI not displaced, irregularly irregular with controlled rate in the 60s, s1 s2 normal, 1/6 systolic murmur, no diastolic murmur, no rubs, gallops, thrills, or heaves Abdomen: soft, nontender; no hepatosplenomehaly, BS+; abdominal aorta nontender and not dilated by palpation. Back: no CVA tenderness Pulses 2+ Musculoskeletal: full range of motion, normal strength, no joint deformities Extremities: no clubbing cyanosis or edema, Homan's sign negative  Neurologic: grossly nonfocal; Cranial nerves grossly wnl Psychologic: Normal mood and affect   Studies/Labs Reviewed:   ECG (independently read by me): Atrial fibrillation at 64 with isolated PVC.  EKG:  EKG is not ordered today.  I personally reviewed the EKG from February 17, 2020 which shows atrial fibrillation at 83 bpm.  QTc interval 437 ms.  Recent Labs: BMP Latest Ref Rng & Units 03/05/2021 02/17/2020 12/09/2019  Glucose 65 - 99 mg/dL 86 90 102(H)  BUN 8 - 27 mg/dL '18 14 14  ' Creatinine 0.57 - 1.00 mg/dL 0.79 0.74 0.83  BUN/Creat Ratio 12 - '28 23 19 17  ' Sodium 134 - 144 mmol/L 135 133(L) 133(L)  Potassium 3.5 - 5.2 mmol/L 3.8 4.1 4.0  Chloride 96 - 106 mmol/L 96 95(L) 92(L)  CO2 20 - 29 mmol/L '20 24 24  ' Calcium 8.7 - 10.3 mg/dL 9.5 9.6 9.7     Hepatic Function Latest Ref Rng & Units 03/05/2021 02/17/2020 12/09/2019  Total Protein 6.0 - 8.5 g/dL 6.7 6.7 7.0  Albumin 3.7 - 4.7 g/dL 4.6 4.3 4.6  AST 0 - 40 IU/L '20 20 18   ' ALT 0 - 32 IU/L '15 16 19  ' Alk Phosphatase 44 - 121 IU/L 74 63 68  Total Bilirubin 0.0 - 1.2 mg/dL 0.4 0.5 0.5    CBC Latest Ref Rng & Units 03/05/2021 02/17/2020 12/09/2019  WBC 3.4 - 10.8 x10E3/uL 6.9 5.5 5.9  Hemoglobin 11.1 - 15.9 g/dL 14.0 13.9 15.0  Hematocrit 34.0 - 46.6 % 40.7 39.4 43.3  Platelets 150 - 450 x10E3/uL 288 254 289   Lab Results  Component Value Date   MCV 97 03/05/2021   MCV 94 02/17/2020   MCV 96 12/09/2019   Lab Results  Component Value Date   TSH 1.910 01/20/2021   Lab Results  Component Value Date   HGBA1C 5.1 11/30/2017     BNP No results found for: BNP  ProBNP    Component Value Date/Time   PROBNP 263.8 (H) 10/09/2011 0333     Lipid Panel     Component Value Date/Time   CHOL 182 02/17/2020 1044   CHOL 168 07/09/2013 0805   TRIG 49 02/17/2020 1044   TRIG 46 07/09/2013 0805   HDL 95 02/17/2020 1044   HDL 78 07/09/2013 0805   CHOLHDL 1.9 02/17/2020 1044   CHOLHDL 2.0 11/30/2017 1523   VLDL 11 11/28/2016 1359   LDLCALC 77 02/17/2020 1044   LDLCALC 95 11/30/2017 1523   LDLCALC 81 07/09/2013 0805   LABVLDL 10 02/17/2020 1044     RADIOLOGY: MR ABDOMEN MRCP W WO CONTAST  Result Date: 04/19/2021 CLINICAL DATA:  Follow-up pancreatic cyst. EXAM: MRI ABDOMEN WITHOUT AND WITH CONTRAST (INCLUDING MRCP) TECHNIQUE: Multiplanar multisequence MR imaging of the abdomen was performed both before and after the administration of intravenous contrast. Heavily T2-weighted images of the biliary and pancreatic ducts were obtained, and three-dimensional MRCP images were rendered by post processing. CONTRAST:  57m MULTIHANCE GADOBENATE DIMEGLUMINE 529 MG/ML IV SOLN COMPARISON:  05/13/2020 FINDINGS: Exam detail is diminished secondary to motion artifact. Lower chest: No acute findings. Hepatobiliary: Mild diffuse hepatic steatosis. Multiple liver cysts are again identified. The largest is in the inferior aspect of segment 6 measuring 3.3 x 2.7 cm, image 20/5.  Unchanged from previous exam. Additional lesions are also compatible with simple cyst and are small biliary hamartomas. No suspicious enhancing liver abnormalities. Gallbladder unremarkable. No gallstones or signs of gallbladder wall thickening. No significant biliary ductal dilatation. Common bile duct measures 4 mm. Pancreas: Numerous cystic lesions are identified scattered throughout the pancreas. The largest is in the body measuring 1.9 cm in long axis, image 22/5. Unchanged from previous exam. Additional small pancreatic lesions are also unchanged in the interval. No main duct dilatation, inflammation or solid enhancing mass identified at this time. Spleen:  Within normal limits in size and appearance. Adrenals/Urinary Tract:  Normal adrenal glands. Status post cryo ablation within the upper pole of right kidney, unchanged from previous exam. 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 Some of these lesions exhibit increased T1 signal and decreased T2 signal compatible with hemorrhagic lesions. Others are T2 hyperintense and T1 hypointense. The largest lesion arises off the medial cortex of the upper pole of left kidney measuring 2.6 cm, image 17/5. This is T2 hyperintense and T1 isointense intense without internal enhancement on the postcontrast images. Stomach/Bowel: Visualized portions within the abdomen are unremarkable. Vascular/Lymphatic: No pathologically enlarged lymph nodes identified. No abdominal aortic aneurysm demonstrated. Other:  No free fluid or fluid collections identified Musculoskeletal: No suspicious bone lesions identified. IMPRESSION: 1. Unchanged appearance of multiple, benign appearing cystic lesions scattered throughout the pancreas which appears similar in size and multiplicity compared to the previous exam. As mentioned previously these are favored to represent benign lesions such as side-branch  intraductal papillary mucinous neoplasm and or pancreatic cyst. Continued interval surveillance is advised with repeat abdominal MRI without and with contrast material is advised in 6 months to ensure continued stability. This recommendation follows ACR consensus guidelines: Management of Incidental Pancreatic Cysts: A White Paper of the ACR Incidental Findings Committee. J Am Coll Radiol 29323;55:732-202 2. Status post cryoablation within the upper pole of right kidney. No signs of recurrent disease. 3. 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 4. Hepatic steatosis. Electronically Signed   By: TKerby MoorsM.D.   On: 04/19/2021 10:16     Additional studies/ records that were reviewed today include:  I reviewed the patient's initial sleep studies which were done at the [facility] in 2009, reviewed her office note by me in 2015, recent records by Dr. [name] and obtained a new download in the office today.  ASSESSMENT:    1. OSA on CPAP   2. Essential hypertension, benign   3. On anticoagulant therapy   4. CAD S/P percutaneous coronary angioplasty   5. Hyperlipidemia with target LDL less than 70   6. Hypothyroidism, unspecified  type     PLAN:  Theresa Mccarty is a 80 year old female who has significant cardiovascular comorbidities including CAD, status post LAD stenting, hypertension, hyperlipidemia, permanent atrial fibrillation on anticoagulation, as well as obstructive sleep apnea originally diagnosed in 2009.  Over the years she admits to good compliance.  She received a new ResMed air sense 10 CPAP unit which is her third machine and her most recent set up date was December 10, 2019.  Downloads have consistently shown excellent response.  Her most recent download was obtained from April 07, 2021 through May 06, 2021.  At 12 cm set pressure AHI remains excellent at 0.5.  Is  unaware of any breakthrough snoring.  She is sleeping with CPAP for 10 hours and 27 minutes per night.  There is no residual daytime sleepiness.  She denies any bruxism or restless legs.  She has had some mild blood pressure lability and blood pressure on repeat by me was 144/80.  She continues to be on diltiazem to 40 daily, valsartan HCT 320/25 mg daily in addition to metoprolol succinate 25 mg.  At times her blood pressure at home can be as low as 116 but as high as 160.  She continues to be on levothyroxine for hypothyroidism at 50 mcg and Zetia for hyperlipidemia.  She is followed by [name].  I will see her in 1 year for reevaluation.    Medication Adjustments/Labs and Tests Ordered: Current medicines are reviewed at length with the patient today.  Concerns regarding medicines are outlined above.  Medication changes, Labs and Tests ordered today are listed in the Patient Instructions below.   Patient Instructions  Medication Instructions:  Your physician recommends that you continue on your current medications as directed. Please refer to the Current Medication list given to you today.  *If you need a refill on your cardiac medications before your next appointment, please call your pharmacy*   Lab Work: None ordered.    Testing/Procedures: None ordered.    Follow-Up: At [facility], you and your health needs are our priority.  As part of our continuing mission to provide you with exceptional heart care, we have created designated Provider Care Teams.  These Care Teams include your primary Cardiologist (physician) and Advanced Practice Providers (APPs -  Physician Assistants and Nurse Practitioners) who all work together to provide you with the care you need, when you need it.  We recommend signing up for the patient portal called "MyChart".  Sign up information is provided on this After Visit Summary.  MyChart is used to connect with patients for Virtual Visits (Telemedicine).   Patients are able to view lab/test results, encounter notes, upcoming appointments, etc.  Non-urgent messages can be sent to your provider as well.   To learn more about what you can do with MyChart, go to [email or web address].    Your next appointment:   12 month(s)  The format for your next appointment:   In Person  Provider:   [name], MD   Other Instructions Please continue to monitor your blood pressure.      Signed, [name], MD  05/13/2021 7:34 PM    [facility] [address] [place], [place], [place]  [address] Phone: [phone]

## 2021-05-13 ENCOUNTER — Encounter: Payer: Self-pay | Admitting: Cardiovascular Disease

## 2021-06-14 DIAGNOSIS — G4733 Obstructive sleep apnea (adult) (pediatric): Secondary | ICD-10-CM | POA: Diagnosis not present

## 2021-06-16 ENCOUNTER — Ambulatory Visit
Admission: RE | Admit: 2021-06-16 | Discharge: 2021-06-16 | Disposition: A | Discharge status: Home / Self Care | Payer: Medicare Other | Source: Ambulatory Visit | Attending: Family Medicine | Admitting: Family Medicine

## 2021-06-16 ENCOUNTER — Other Ambulatory Visit: Payer: Self-pay

## 2021-06-16 ENCOUNTER — Encounter: Payer: Self-pay | Admitting: Family Medicine

## 2021-06-16 ENCOUNTER — Ambulatory Visit (INDEPENDENT_AMBULATORY_CARE_PROVIDER_SITE_OTHER): Payer: Medicare Other | Admitting: Family Medicine

## 2021-06-16 VITALS — BP 128/70 | HR 72 | Ht 62.5 in | Wt 160.4 lb

## 2021-06-16 DIAGNOSIS — M79672 Pain in left foot: Secondary | ICD-10-CM | POA: Diagnosis not present

## 2021-06-16 DIAGNOSIS — M25572 Pain in left ankle and joints of left foot: Secondary | ICD-10-CM

## 2021-06-16 DIAGNOSIS — M79662 Pain in left lower leg: Secondary | ICD-10-CM | POA: Diagnosis not present

## 2021-06-16 DIAGNOSIS — M7989 Other specified soft tissue disorders: Secondary | ICD-10-CM | POA: Diagnosis not present

## 2021-06-16 DIAGNOSIS — M7732 Calcaneal spur, left foot: Secondary | ICD-10-CM | POA: Diagnosis not present

## 2021-06-16 NOTE — Progress Notes (Signed)
Chief Complaint  Patient presents with   Ankle Injury    About 20 days ago was walking her rocker and walked right into it. Foot and ankle swelled up and bruised right away. Looks better now but is hurting more than ever, throbbing and sharp pains. Used Aspercreme, does help some. Has not tried anything else.     Approx 6/12 while walking across the room to change TV channel, she hit the rocking chair in the room at the front of her left ankle.  She developed a knot, swelling and bruising.  She reports she noted a huge knot the next day, at lateral aspect of anterior ankle 2 days later--very swollen and bruised (showed me photo on phone--foot was very swollen, bruising distally in foot)  Icing, rest and elevation periodically has helped.  3 days ago developed throbbing pain, all throughout the whole foot and ankle. Throbs whenever leg isn't elevated. No pain with walking   PMH, PSH, SH reviewed  Outpatient Encounter Medications as of 06/16/2021  Medication Sig   alendronate (FOSAMAX) 70 MG tablet TAKE 1 TABLET BY MOUTH EVERY 7 DAYS. TAKE WITH A FULL GLASS OF WATER ON AN EMPTY STOMACH.   aspirin EC 81 MG tablet Take 1 tablet (81 mg total) by mouth daily.   B Complex Vitamins (B-COMPLEX/B-12 PO) Take 1 tablet by mouth daily.   Cholecalciferol (VITAMIN D) 2000 units tablet Take 2,000 Units by mouth daily.   Coenzyme Q10 (COQ10) 100 MG CAPS Take 1 tablet by mouth daily.    diltiazem (CARDIZEM CD) 240 MG 24 hr capsule TAKE 1 CAPSULE BY MOUTH EVERY DAY   ezetimibe (ZETIA) 10 MG tablet TAKE 1 TABLET BY MOUTH EVERY DAY   levothyroxine (SYNTHROID) 50 MCG tablet TAKE 1 TABLET BY MOUTH EVERY DAY   metoprolol succinate (TOPROL-XL) 25 MG 24 hr tablet TAKE 1/2 TAB DAILY FOR 1 TO 2 WEEKS. IF STILL HAVING INCREASED HEART RATE & BP MAY INCREASE TO 1 TAB   MILK THISTLE PO Take 1 capsule by mouth daily.   valsartan-hydrochlorothiazide (DIOVAN-HCT) 320-25 MG tablet TAKE 1 TABLET BY MOUTH EVERY DAY    XARELTO 20 MG TABS tablet TAKE 1 TABLET BY MOUTH DAILY WITH SUPPER   Zinc 100 MG TABS    nitroGLYCERIN (NITROSTAT) 0.4 MG SL tablet PLACE 1 TABLET (0.4 MG TOTAL) UNDER THE TONGUE EVERY 5 (FIVE) MINUTES AS NEEDED FOR CHEST PAIN. (Patient not taking: Reported on 06/16/2021)   Probiotic Product (PROBIOTIC PO) Take 1 capsule by mouth daily. (Patient not taking: Reported on 06/16/2021)   No facility-administered encounter medications on file as of 06/16/2021.   Allergies  Allergen Reactions   Atorvastatin Other (See Comments)    Muscle aches   Celebrex [Celecoxib] Other (See Comments)    Muscle aches   Nitrofurantoin Nausea Only and Rash    ROS: no fever, chills, URI symptoms, headaches, chest pain, shortness of breath, bleeding, rash. +L foot pain, swelling and bruising per HPI.   PHYSICAL EXAM:  BP 128/70   Pulse 72   Ht 5' 2.5" (1.588 m)   Wt 160 lb 6.4 oz (72.8 kg)   BMI 28.87 kg/m   Well-appearing female in no distress LLE: 2+ pulses. No pitting edema.  Bruising is noted only at edges of the foot (entire medial and lateral sides of the foot, and across the toes.  Top of foot has no significant bruising or swelling. There is soft tissue swelling at the lateral aspect of the anterior ankle, tender  over this area, as well as very tender at the distal fibula Mild tenderness at tibia, anteriorly at the ankle Some tenderness at the distal 2nd and 3rd metatarsals.  ASSESSMENT/PLAN:  Left foot pain - Plan: DG Foot Complete Left, Ambulatory referral to Orthopedic Surgery  Acute left ankle pain - Plan: DG Tibia/Fibula Left, Ambulatory referral to Orthopedic Surgery  Discussed elevation, poss compression, if tolerated, continued icing prn swelling/pain. Concern for fracture, given persistent pain. Even if x-ray is negative, I think it is worth seeing the orthopedist, given persistence of pain to evaluate and treat.

## 2021-06-16 NOTE — Patient Instructions (Signed)
Go to [facility]  (either [address]) for x-rays.  Keep the leg elevated when seated. Compression may also help (if tolerable) I suspect there is a fracture and we will be sending you to the orthopedist for further evaluation and treatment.

## 2021-06-17 ENCOUNTER — Encounter: Payer: Self-pay | Admitting: Orthopedic Surgery

## 2021-06-17 ENCOUNTER — Ambulatory Visit: Payer: Medicare Other | Admitting: Orthopedic Surgery

## 2021-06-17 DIAGNOSIS — I872 Venous insufficiency (chronic) (peripheral): Secondary | ICD-10-CM

## 2021-06-17 DIAGNOSIS — T45515A Adverse effect of anticoagulants, initial encounter: Secondary | ICD-10-CM

## 2021-06-17 NOTE — Progress Notes (Signed)
Office Visit Note   Patient: Theresa Mccarty           Date of Birth: [birth year]           MRN: [id number] Visit Date: 06/17/2021              Requested by: [name], [address] [place],  [place] [address] PCP: [name], MD  Chief Complaint  Patient presents with   Left Foot - Pain   Left Ankle - Pain      HPI: Patient is a 80 year old woman who about 3 weeks ago struck the dorsal lateral aspect of the left ankle on the end of a rocking chair.  She had spontaneous swelling ecchymosis and bruising secondary to her Xarelto and aspirin.  Patient states she has had a history of bruising in her arms as well secondary to her anticoagulation therapy.  Assessment & Plan: Visit Diagnoses:  1. Venous insufficiency of both lower extremities   2. Adverse effect of anticoagulant, initial encounter     Plan: Patient was given a prescription to go to [facility] to obtain a size large compression stocking.  She will wear this around-the-clock follow-up in 4 weeks or sooner if needed.  Follow-Up Instructions: Return in about 4 weeks (around 07/15/2021).   Ortho Exam  Patient is alert, oriented, no adenopathy, well-dressed, normal affect, normal respiratory effort. Examination patient has ecchymosis and bruising in the forearms bilaterally as well as venous insufficiency in both legs.  She has a large area of swelling ecchymosis and bruising over the dorsal lateral aspect the left ankle secondary to the blunt trauma there is no drainage.  She has ecchymosis and bruising down into the metatarsal heads.  Review the radiographs of the left foot and left leg and ankle shows no evidence of a fracture.  Her calf measures 36 cm in circumference.  Imaging: DG Tibia/Fibula Left  Result Date: 06/16/2021 CLINICAL DATA:  Leg injury 05/30/2021.  Pain and swelling EXAM: LEFT TIBIA AND FIBULA - 2 VIEW COMPARISON:  None. FINDINGS: There is no evidence of fracture or other  focal bone lesions. Soft tissues are unremarkable. IMPRESSION: Negative. Electronically Signed   By: Franchot Gallo M.D.   On: 06/16/2021 13:51   DG Foot Complete Left  Result Date: 06/16/2021 CLINICAL DATA:  Injury 05/30/2021.  Pain and swelling EXAM: LEFT FOOT - COMPLETE 3+ VIEW COMPARISON:  None. FINDINGS: There is no evidence of fracture or dislocation. There is no evidence of arthropathy or other focal bone abnormality. Soft tissues are unremarkable. Calcaneal spurring. Mild midfoot spurring. IMPRESSION: No acute abnormality. Electronically Signed   By: Franchot Gallo M.D.   On: 06/16/2021 13:52   No images are attached to the encounter.  Labs: Lab Results  Component Value Date   HGBA1C 5.1 11/30/2017   HGBA1C 4.6 11/28/2016   HGBA1C 5.1 11/02/2015   LABORGA ESCHERICHIA COLI 05/19/2016     Lab Results  Component Value Date   ALBUMIN 4.6 03/05/2021   ALBUMIN 4.3 02/17/2020   ALBUMIN 4.6 12/09/2019    Lab Results  Component Value Date   MG 2.0 03/16/2017   MG 2.0 10/08/2011   Lab Results  Component Value Date   VD25OH 49 11/02/2015   VD25OH 91 (H) 10/16/2013    No results found for: PREALBUMIN CBC EXTENDED Latest Ref Rng & Units 03/05/2021 02/17/2020 12/09/2019  WBC 3.4 - 10.8 x10E3/uL 6.9 5.5 5.9  RBC 3.77 - 5.28 x10E6/uL 4.20 4.18 4.49  HGB 11.1 - 15.9 g/dL 14.0 13.9 15.0  HCT 34.0 - 46.6 % 40.7 39.4 43.3  PLT 150 - 450 x10E3/uL 288 254 289  NEUTROABS 1.4 - 7.0 x10E3/uL 4.7 - 3.7  LYMPHSABS 0.7 - 3.1 x10E3/uL 1.4 - 1.5     There is no height or weight on file to calculate BMI.  Orders:  No orders of the defined types were placed in this encounter.  No orders of the defined types were placed in this encounter.    Procedures: No procedures performed  Clinical Data: No additional findings.  ROS:  All other systems negative, except as noted in the HPI. Review of Systems  Objective: Vital Signs: There were no vitals taken for this visit.  Specialty  Comments:  No specialty comments available.  PMFS History: Patient Active Problem List   Diagnosis Date Noted   Aortic atherosclerosis ([place]) 01/08/2021   Myalgia due to statin 01/08/2021   Pancreatic cyst 01/07/2021   Mixed hyperlipidemia 09/03/2018   Coronary artery disease involving native coronary artery of native heart without angina pectoris 05/21/2018   Persistent atrial fibrillation ([place]) 03/16/2017   Essential hypertension 06/13/2016   Dyslipidemia 06/13/2016   Annual physical exam 06/13/2016   Renal cell carcinoma ([place])    Renal cyst, left    Hypothyroidism 08/09/2012   CAD S/P percutaneous coronary angioplasty 08/09/2012   Essential hypertension, benign 08/09/2012   Pure hypercholesterolemia 08/09/2012   OSA on CPAP 08/09/2012   Osteoporosis 08/09/2012   Past Medical History:  Diagnosis Date   Atrial fibrillation ([place])    a. initially diagnosed in 02/2017. Started on Eliquis   Back pain, chronic    Broken leg 1958   CAD (coronary artery disease)    a. s/p DES to LAD in 2008, patent by cath in 2012.   Hearing loss    bilateral hearing aids   History of echocardiogram 09/11/2007   normal   History of nuclear stress test 11/17/2010   exercise; small fixed anteroseptal defect (?artifact); short run of atrial-tachy during recovery; low risk   Hyperlipidemia    Hypertension    Hypothyroid    Impaired fasting glucose    Osteoporosis    Renal cell carcinoma right; 2011   Dr. Alinda Money, [facility]   Sleep apnea    on CPAP    Family History  Problem Relation Age of Onset   Heart disease Mother    Hypertension Mother    Arthritis Mother    Osteoporosis Mother    Depression Sister    Hyperlipidemia Sister    Heart disease Father    Diabetes Brother    Hyperlipidemia Son    Heart disease Son 89       cardiac arrest, 4 blockages, stents   Cancer Sister        unsure, related to smoking?   Cancer Sister        lung cancer   Hyperlipidemia Brother     Hypertension Brother     Past Surgical History:  Procedure Laterality Date   ABDOMINAL HYSTERECTOMY  1975   PARTIAL; part of 1 ovary remains   CARDIAC CATHETERIZATION  10/08/2011   LAD-prox stent patent; no significant obstructive CAD; hi LVF end-diastolic pressure   CARDIOVERSION N/A 05/12/2017   Procedure: CARDIOVERSION;  Surgeon: [name], MD;  Location: [facility];  Service: Cardiovascular;  Laterality: N/A;   CATARACT EXTRACTION, BILATERAL Bilateral 08/2019   Dr. [name]   COLONOSCOPY  03/21/2006   polyps (hyperplastic polyps),  diverticula, internal hemorrhoids; Dr. Benson Norway   COLONOSCOPY  07/26/2011   diverticula, medium hemorrhoids; no rec f/u unless sx   CORONARY ANGIOPLASTY WITH STENT PLACEMENT  2008   mid LAD   IR GENERIC HISTORICAL  08/18/2016   IR RADIOLOGIST EVAL & MGMT 08/18/2016 [name], MD [facility]   IR RADIOLOGIST EVAL & MGMT  08/09/2018   IR RADIOLOGIST EVAL & MGMT  09/19/2019   KIDNEY SURGERY  2011   renal carcinoma - cryoablation -  Dr. Alinda Money and Dr. Laural Roes (cryoablation)   SPINE SURGERY     Social History   Occupational History   Occupation: retired Teacher, music: [employer]  Tobacco Use   Smoking status: Former    Pack years: 0.00    Types: Cigarettes    Quit date: 12/19/1968    Years since quitting: 52.5   Smokeless tobacco: Never  Vaping Use   Vaping Use: Never used  Substance and Sexual Activity   Alcohol use: Yes    Alcohol/week: 2.0 standard drinks    Types: 2 Glasses of wine per week    Comment: 1-3 glasses of wine 3/week (occ martini)   Drug use: No   Sexual activity: Not Currently

## 2021-06-18 ENCOUNTER — Encounter: Payer: Self-pay | Admitting: Family Medicine

## 2021-07-15 ENCOUNTER — Ambulatory Visit: Payer: Medicare Other | Admitting: Orthopedic Surgery

## 2021-07-15 ENCOUNTER — Ambulatory Visit: Payer: Self-pay

## 2021-07-15 DIAGNOSIS — I872 Venous insufficiency (chronic) (peripheral): Secondary | ICD-10-CM | POA: Diagnosis not present

## 2021-07-15 DIAGNOSIS — M545 Low back pain, unspecified: Secondary | ICD-10-CM

## 2021-07-21 ENCOUNTER — Other Ambulatory Visit: Payer: Self-pay

## 2021-07-21 ENCOUNTER — Telehealth: Payer: Self-pay | Admitting: Orthopedic Surgery

## 2021-07-21 MED ORDER — PREDNISONE 10 MG PO TABS: 10.0000 mg | ORAL_TABLET | Freq: Every day | ORAL | 0 refills | Status: DC

## 2021-07-21 NOTE — Telephone Encounter (Signed)
done

## 2021-07-21 NOTE — Telephone Encounter (Signed)
Pt calling asking for steroid or prescription that was discussed at previous appt with Sharol Given. Pt was told if she did not feel and better to call back and he will get it sent in for her. The best pharmacy is the one on file and the best call back number if needed is [phone].

## 2021-07-21 NOTE — Telephone Encounter (Signed)
Can you please call pt and make an appt for follow up in 4 weeks. We called in rx for prednisone but she needs follow up.

## 2021-07-21 NOTE — Telephone Encounter (Signed)
Pt in office 07/15/21 note not yet dictated please see message below.

## 2021-07-22 ENCOUNTER — Encounter: Payer: Self-pay | Admitting: Orthopedic Surgery

## 2021-07-22 NOTE — Progress Notes (Signed)
Office Visit Note   Patient: Theresa Mccarty           Date of Birth: [birth year]           MRN: [id number] Visit Date: 07/15/2021              Requested by: [name], MD [address] [place],  [place] [address] PCP: [name], MD  Chief Complaint  Patient presents with   Left Leg - Follow-up   Lower Back - Pain      HPI: Patient is a 79 year old woman who presents for 2 separate issues.  #1 she states she struck her ankle on a rocking chair on June 9 and had some swelling and bruising she is currently been wearing a compression stocking.  #2 patient complains of chronic lower back pain across the lumbar spine with pain radiating to the right buttocks worse than the left.  Assessment & Plan: Visit Diagnoses:  1. Acute bilateral low back pain without sciatica   2. Venous insufficiency of both lower extremities     Plan: Continue with the knee-high compression stockings for the venous insufficiency she has healed the traumatic wound on the left ankle well.  Patient will try Voltaren gel for her back symptoms.  She has already completed a course of prednisone.  Also recommended strengthening exercises for the core.  Discussed that if she is not getting better we could call in a refill for the prednisone.  Follow-Up Instructions: No follow-ups on file.   Ortho Exam  Patient is alert, oriented, no adenopathy, well-dressed, normal affect, normal respiratory effort. Examination the left ankle ulcer has healed there is no cellulitis no drainage no signs of infection.  Examination of the lumbar spine she has no radicular symptoms below the buttocks no focal motor deficits.  Imaging: No results found. No images are attached to the encounter.  Labs: Lab Results  Component Value Date   HGBA1C 5.1 11/30/2017   HGBA1C 4.6 11/28/2016   HGBA1C 5.1 11/02/2015   LABORGA ESCHERICHIA COLI 05/19/2016     Lab Results  Component Value Date   ALBUMIN 4.6 03/05/2021   ALBUMIN  4.3 02/17/2020   ALBUMIN 4.6 12/09/2019    Lab Results  Component Value Date   MG 2.0 03/16/2017   MG 2.0 10/08/2011   Lab Results  Component Value Date   VD25OH 49 11/02/2015   VD25OH 91 (H) 10/16/2013    No results found for: PREALBUMIN CBC EXTENDED Latest Ref Rng & Units 03/05/2021 02/17/2020 12/09/2019  WBC 3.4 - 10.8 x10E3/uL 6.9 5.5 5.9  RBC 3.77 - 5.28 x10E6/uL 4.20 4.18 4.49  HGB 11.1 - 15.9 g/dL 14.0 13.9 15.0  HCT 34.0 - 46.6 % 40.7 39.4 43.3  PLT 150 - 450 x10E3/uL 288 254 289  NEUTROABS 1.4 - 7.0 x10E3/uL 4.7 - 3.7  LYMPHSABS 0.7 - 3.1 x10E3/uL 1.4 - 1.5     There is no height or weight on file to calculate BMI.  Orders:  Orders Placed This Encounter  Procedures   XR Lumbar Spine 2-3 Views   No orders of the defined types were placed in this encounter.    Procedures: No procedures performed  Clinical Data: No additional findings.  ROS:  All other systems negative, except as noted in the HPI. Review of Systems  Objective: Vital Signs: There were no vitals taken for this visit.  Specialty Comments:  No specialty comments available.  PMFS History: Patient Active Problem List   Diagnosis  Date Noted   Aortic atherosclerosis ([place]) 01/08/2021   Myalgia due to statin 01/08/2021   Pancreatic cyst 01/07/2021   Mixed hyperlipidemia 09/03/2018   Coronary artery disease involving native coronary artery of native heart without angina pectoris 05/21/2018   Persistent atrial fibrillation ([place]) 03/16/2017   Essential hypertension 06/13/2016   Dyslipidemia 06/13/2016   Annual physical exam 06/13/2016   Renal cell carcinoma ([place])    Renal cyst, left    Hypothyroidism 08/09/2012   CAD S/P percutaneous coronary angioplasty 08/09/2012   Essential hypertension, benign 08/09/2012   Pure hypercholesterolemia 08/09/2012   OSA on CPAP 08/09/2012   Osteoporosis 08/09/2012   Past Medical History:  Diagnosis Date   Atrial fibrillation ([place])    a. initially  diagnosed in 02/2017. Started on Eliquis   Back pain, chronic    Broken leg 1958   CAD (coronary artery disease)    a. s/p DES to LAD in 2008, patent by cath in 2012.   Hearing loss    bilateral hearing aids   History of echocardiogram 09/11/2007   normal   History of nuclear stress test 11/17/2010   exercise; small fixed anteroseptal defect (?artifact); short run of atrial-tachy during recovery; low risk   Hyperlipidemia    Hypertension    Hypothyroid    Impaired fasting glucose    Osteoporosis    Renal cell carcinoma right; 2011   Dr. Alinda Money, [facility]   Sleep apnea    on CPAP    Family History  Problem Relation Age of Onset   Heart disease Mother    Hypertension Mother    Arthritis Mother    Osteoporosis Mother    Depression Sister    Hyperlipidemia Sister    Heart disease Father    Diabetes Brother    Hyperlipidemia Son    Heart disease Son 61       cardiac arrest, 4 blockages, stents   Cancer Sister        unsure, related to smoking?   Cancer Sister        lung cancer   Hyperlipidemia Brother    Hypertension Brother     Past Surgical History:  Procedure Laterality Date   ABDOMINAL HYSTERECTOMY  1975   PARTIAL; part of 1 ovary remains   CARDIAC CATHETERIZATION  10/08/2011   LAD-prox stent patent; no significant obstructive CAD; hi LVF end-diastolic pressure   CARDIOVERSION N/A 05/12/2017   Procedure: CARDIOVERSION;  Surgeon: [name], MD;  Location: [facility];  Service: Cardiovascular;  Laterality: N/A;   CATARACT EXTRACTION, BILATERAL Bilateral 08/2019   Dr. [name]   COLONOSCOPY  03/21/2006   polyps (hyperplastic polyps), diverticula, internal hemorrhoids; Dr. Benson Norway   COLONOSCOPY  07/26/2011   diverticula, medium hemorrhoids; no rec f/u unless sx   CORONARY ANGIOPLASTY WITH STENT PLACEMENT  2008   mid LAD   IR GENERIC HISTORICAL  08/18/2016   IR RADIOLOGIST EVAL & MGMT 08/18/2016 [name], MD [facility]   IR RADIOLOGIST EVAL &  MGMT  08/09/2018   IR RADIOLOGIST EVAL & MGMT  09/19/2019   KIDNEY SURGERY  2011   renal carcinoma - cryoablation -  Dr. Alinda Money and Dr. Laural Roes (cryoablation)   SPINE SURGERY     Social History   Occupational History   Occupation: retired Psychiatric nurse    Employer: [employer]  Tobacco Use   Smoking status: Former    Types: Cigarettes    Quit date: 12/19/1968    Years since quitting: 81.6  Smokeless tobacco: Never  Vaping Use   Vaping Use: Never used  Substance and Sexual Activity   Alcohol use: Yes    Alcohol/week: 2.0 standard drinks    Types: 2 Glasses of wine per week    Comment: 1-3 glasses of wine 3/week (occ martini)   Drug use: No   Sexual activity: Not Currently

## 2021-08-05 ENCOUNTER — Other Ambulatory Visit: Payer: Self-pay

## 2021-08-05 ENCOUNTER — Ambulatory Visit: Payer: Medicare Other | Admitting: Physician Assistant

## 2021-08-05 ENCOUNTER — Encounter: Payer: Self-pay | Admitting: Orthopedic Surgery

## 2021-08-05 DIAGNOSIS — M545 Low back pain, unspecified: Secondary | ICD-10-CM

## 2021-08-05 NOTE — Progress Notes (Signed)
Office Visit Note   Patient: Theresa Mccarty           Date of Birth: April 14, 1941           MRN: [id number] Visit Date: 08/05/2021              Requested by: [name], [address] [place],  [place] [address] PCP: [name], MD  Chief Complaint  Patient presents with   Left Ankle - Follow-up   Lower Back - Follow-up      HPI: Patient presents in follow-up for her venous insufficiency.  She wears her compression socks periodically but admits not all the time.  That being said she feels she is doing much better.  She also follows up for her lower back pain.  This too has gotten better.  She still takes the prednisone.  She says she still has quite a bit left.  Assessment & Plan: Visit Diagnoses: No diagnosis found.  Plan: Patient will wean off prednisone and given her instructions for this.  She is anticipating moving to [employer] area.  If she has significant return of back pain she is to contact us  Follow-Up Instructions: No follow-ups on file.   Ortho Exam  Patient is alert, oriented, no adenopathy, well-dressed, normal affect, normal respiratory effort. Examination of her lower extremities chronic venous insufficiency but no swelling no ulcers she has no open areas of concern.  No cellulitis or signs of infection swelling is well controlled she has significant varicosities.  Lower back she has 5 out of 5 strength no straight leg raise no pain with range of motion.  No radicular findings  Imaging: No results found. No images are attached to the encounter.  Labs: Lab Results  Component Value Date   HGBA1C 5.1 11/30/2017   HGBA1C 4.6 11/28/2016   HGBA1C 5.1 11/02/2015   LABORGA ESCHERICHIA COLI 05/19/2016     Lab Results  Component Value Date   ALBUMIN 4.6 03/05/2021   ALBUMIN 4.3 02/17/2020   ALBUMIN 4.6 12/09/2019    Lab Results  Component Value Date   MG 2.0 03/16/2017   MG 2.0 10/08/2011   Lab Results  Component Value Date   VD25OH 49  11/02/2015   VD25OH 91 (H) 10/16/2013    No results found for: PREALBUMIN CBC EXTENDED Latest Ref Rng & Units 03/05/2021 02/17/2020 12/09/2019  WBC 3.4 - 10.8 x10E3/uL 6.9 5.5 5.9  RBC 3.77 - 5.28 x10E6/uL 4.20 4.18 4.49  HGB 11.1 - 15.9 g/dL 14.0 13.9 15.0  HCT 34.0 - 46.6 % 40.7 39.4 43.3  PLT 150 - 450 x10E3/uL 288 254 289  NEUTROABS 1.4 - 7.0 x10E3/uL 4.7 - 3.7  LYMPHSABS 0.7 - 3.1 x10E3/uL 1.4 - 1.5     There is no height or weight on file to calculate BMI.  Orders:  No orders of the defined types were placed in this encounter.  No orders of the defined types were placed in this encounter.    Procedures: No procedures performed  Clinical Data: No additional findings.  ROS:  All other systems negative, except as noted in the HPI. Review of Systems  Objective: Vital Signs: There were no vitals taken for this visit.  Specialty Comments:  No specialty comments available.  PMFS History: Patient Active Problem List   Diagnosis Date Noted   Aortic atherosclerosis ([place]) 01/08/2021   Myalgia due to statin 01/08/2021   Pancreatic cyst 01/07/2021   Mixed hyperlipidemia 09/03/2018   Coronary artery  disease involving native coronary artery of native heart without angina pectoris 05/21/2018   Persistent atrial fibrillation ([place]) 03/16/2017   Essential hypertension 06/13/2016   Dyslipidemia 06/13/2016   Annual physical exam 06/13/2016   Renal cell carcinoma ([place])    Renal cyst, left    Hypothyroidism 08/09/2012   CAD S/P percutaneous coronary angioplasty 08/09/2012   Essential hypertension, benign 08/09/2012   Pure hypercholesterolemia 08/09/2012   OSA on CPAP 08/09/2012   Osteoporosis 08/09/2012   Past Medical History:  Diagnosis Date   Atrial fibrillation [facility])    a. initially diagnosed in 02/2017. Started on Eliquis   Back pain, chronic    Broken leg 1958   CAD (coronary artery disease)    a. s/p DES to LAD in 2008, patent by cath in 2012.   Hearing loss     bilateral hearing aids   History of echocardiogram 09/11/2007   normal   History of nuclear stress test 11/17/2010   exercise; small fixed anteroseptal defect (?artifact); short run of atrial-tachy during recovery; low risk   Hyperlipidemia    Hypertension    Hypothyroid    Impaired fasting glucose    Osteoporosis    Renal cell carcinoma right; 2011   Dr. Alinda Money, [facility]   Sleep apnea    on CPAP    Family History  Problem Relation Age of Onset   Heart disease Mother    Hypertension Mother    Arthritis Mother    Osteoporosis Mother    Depression Sister    Hyperlipidemia Sister    Heart disease Father    Diabetes Brother    Hyperlipidemia Son    Heart disease Son 2       cardiac arrest, 4 blockages, stents   Cancer Sister        unsure, related to smoking?   Cancer Sister        lung cancer   Hyperlipidemia Brother    Hypertension Brother     Past Surgical History:  Procedure Laterality Date   ABDOMINAL HYSTERECTOMY  1975   PARTIAL; part of 1 ovary remains   CARDIAC CATHETERIZATION  10/08/2011   LAD-prox stent patent; no significant obstructive CAD; hi LVF end-diastolic pressure   CARDIOVERSION N/A 05/12/2017   Procedure: CARDIOVERSION;  Surgeon: [name], MD;  Location: [facility] ENDOSCOPY;  Service: Cardiovascular;  Laterality: N/A;   CATARACT EXTRACTION, BILATERAL Bilateral 08/2019   Dr. [name]   COLONOSCOPY  03/21/2006   polyps (hyperplastic polyps), diverticula, internal hemorrhoids; Dr. Benson Norway   COLONOSCOPY  07/26/2011   diverticula, medium hemorrhoids; no rec f/u unless sx   CORONARY ANGIOPLASTY WITH STENT PLACEMENT  2008   mid LAD   IR GENERIC HISTORICAL  08/18/2016   IR RADIOLOGIST EVAL & MGMT 08/18/2016 [name], MD [facility] INTERV RAD   IR RADIOLOGIST EVAL & MGMT  08/09/2018   IR RADIOLOGIST EVAL & MGMT  09/19/2019   KIDNEY SURGERY  2011   renal carcinoma - cryoablation -  Dr. Alinda Money and Dr. Laural Roes (cryoablation)   SPINE SURGERY      Social History   Occupational History   Occupation: retired Psychiatric nurse    Employer: [employer]  Tobacco Use   Smoking status: Former    Types: Cigarettes    Quit date: 12/19/1968    Years since quitting: 52.6   Smokeless tobacco: Never  Vaping Use   Vaping Use: Never used  Substance and Sexual Activity   Alcohol use: Yes    Alcohol/week: 2.0  standard drinks    Types: 2 Glasses of wine per week    Comment: 1-3 glasses of wine 3/week (occ martini)   Drug use: No   Sexual activity: Not Currently

## 2021-08-18 ENCOUNTER — Telehealth: Payer: Self-pay | Admitting: Internal Medicine

## 2021-08-18 ENCOUNTER — Other Ambulatory Visit: Payer: Self-pay | Admitting: Orthopedic Surgery

## 2021-08-18 NOTE — Telephone Encounter (Signed)
  Patient calling the office for samples of medication:   1.  What medication and dosage are you requesting samples for?  XARELTO 20 MG TABS tablet  2.  Are you currently out of this medication?    Patient is requesting samples because she can't afford the medicaiton. She states it is costing her 3 times what she was paying for it.

## 2021-08-18 NOTE — Telephone Encounter (Signed)
Spoke to patient Xarelto 20 mg samples left at [employer] office front desk.

## 2021-08-25 ENCOUNTER — Encounter: Payer: Self-pay | Admitting: Orthopedic Surgery

## 2021-08-26 ENCOUNTER — Other Ambulatory Visit: Payer: Self-pay | Admitting: Family Medicine

## 2021-08-26 ENCOUNTER — Encounter: Payer: Self-pay | Admitting: Family Medicine

## 2021-08-26 ENCOUNTER — Other Ambulatory Visit: Payer: Self-pay | Admitting: Internal Medicine

## 2021-08-26 ENCOUNTER — Other Ambulatory Visit: Payer: Self-pay | Admitting: Orthopedic Surgery

## 2021-08-26 DIAGNOSIS — Z5181 Encounter for therapeutic drug level monitoring: Secondary | ICD-10-CM

## 2021-08-26 DIAGNOSIS — E7800 Pure hypercholesterolemia, unspecified: Secondary | ICD-10-CM

## 2021-08-27 ENCOUNTER — Other Ambulatory Visit: Payer: Self-pay | Admitting: Family

## 2021-08-27 DIAGNOSIS — M545 Low back pain, unspecified: Secondary | ICD-10-CM

## 2021-09-03 ENCOUNTER — Other Ambulatory Visit: Payer: Self-pay | Admitting: Gastroenterology

## 2021-09-03 ENCOUNTER — Other Ambulatory Visit: Payer: Self-pay | Admitting: Psychiatry

## 2021-09-03 DIAGNOSIS — K862 Cyst of pancreas: Secondary | ICD-10-CM

## 2021-09-07 ENCOUNTER — Ambulatory Visit (INDEPENDENT_AMBULATORY_CARE_PROVIDER_SITE_OTHER): Payer: Medicare Other | Admitting: Physical Medicine and Rehabilitation

## 2021-09-07 ENCOUNTER — Other Ambulatory Visit: Payer: Medicare Other

## 2021-09-07 ENCOUNTER — Encounter: Payer: Self-pay | Admitting: Physical Medicine and Rehabilitation

## 2021-09-07 ENCOUNTER — Other Ambulatory Visit: Payer: Self-pay

## 2021-09-07 VITALS — BP 144/78 | HR 85

## 2021-09-07 DIAGNOSIS — M419 Scoliosis, unspecified: Secondary | ICD-10-CM | POA: Diagnosis not present

## 2021-09-07 DIAGNOSIS — E7800 Pure hypercholesterolemia, unspecified: Secondary | ICD-10-CM

## 2021-09-07 DIAGNOSIS — G8929 Other chronic pain: Secondary | ICD-10-CM

## 2021-09-07 DIAGNOSIS — M47819 Spondylosis without myelopathy or radiculopathy, site unspecified: Secondary | ICD-10-CM

## 2021-09-07 DIAGNOSIS — M545 Low back pain, unspecified: Secondary | ICD-10-CM

## 2021-09-07 DIAGNOSIS — K862 Cyst of pancreas: Secondary | ICD-10-CM

## 2021-09-07 DIAGNOSIS — Z5181 Encounter for therapeutic drug level monitoring: Secondary | ICD-10-CM

## 2021-09-07 DIAGNOSIS — M546 Pain in thoracic spine: Secondary | ICD-10-CM | POA: Diagnosis not present

## 2021-09-07 LAB — LIPID PANEL
Chol/HDL Ratio: 2.1 ratio (ref 0.0–4.4)
Cholesterol, Total: 230 mg/dL — ABNORMAL HIGH (ref 100–199)
HDL: 112 mg/dL (ref >39)
LDL Chol Calc (NIH): 108 mg/dL — ABNORMAL HIGH (ref 0–99)
Triglycerides: 56 mg/dL (ref 0–149)
VLDL Cholesterol Cal: 10 mg/dL (ref 5–40)

## 2021-09-07 NOTE — Progress Notes (Signed)
Pt state middle and lower back pain. Pt walking, standing and cleaning her house makes the pain worse. Pt state she takes she take pain meds to help ease her pain. Pt state she uses heat wraps and excises to help ease her pain.  Numeric Pain Rating Scale and Functional Assessment Average Pain 10 Pain Right Now 4 My pain is constant, sharp, and aching Pain is worse with: walking, standing, and some activites Pain improves with: heat/ice, therapy/exercise, and medication   In the last MONTH (on 0-10 scale) has pain interfered with the following?  1. General activity like being  able to carry out your everyday physical activities such as walking, climbing stairs, carrying groceries, or moving a chair?  Rating(7)  2. Relation with others like being able to carry out your usual social activities and roles such as  activities at home, at work and in your community. Rating(8)  3. Enjoyment of life such that you have  been bothered by emotional problems such as feeling anxious, depressed or irritable?  Rating(9)

## 2021-09-07 NOTE — Progress Notes (Signed)
Theresa Mccarty - 80 y.o. female MRN [id number]  Date of birth: [birth year]  Office Visit Note: Visit Date: 09/07/2021 PCP: [name], MD Referred by: [name], MD  Subjective: Chief Complaint  Patient presents with   Middle Back - Pain   Lower Back - Pain   HPI: Theresa Mccarty is a 80 y.o. female who comes in today Per the request of Dr. Meridee Score for evaluation of 2 ongoing issues, bilateral lower back pain radiating to buttocks and hips. Also reports diffuse thoracic pain.  Patient reports bilateral lower back pain has been chronic for several months.  Patient reports pain is exacerbated by walking and activity, describes as a throbbing and soreness sensation, rates as 8 out of 10. Patient reports some relief of pain with Prednisone, Heat and topical creams.  Patient had recent lumbar spine x-rays that exhibits degenerative scoliosis with degenerative changes worse at T12-L1 and L1-L2. No anterolisthesis noted.  She reported to Dr. Sharol Given she was having some symptoms into the right more than left buttock at times as well quite severe.  Patient reports diffuse thoracic pain has also been ongoing for several months, worsens with activity and is alleviated by rest. Patient describes pain as soreness and currently rates as 6 out of 10.  Recent lumbar spine x-rays do exhibit significant scolosis.  Patient states she recently finished regimen of Prednisone and reports she can't tell that this helped her pain. Patient states she has not had MRI imaging of her spine and has not attended formal physical therapy. Patient reports she recently sold her home and is currently moving to [place], [place]. Patient would like recommendations for physiatrist in this area.  Patient denies focal weakness, numbness and tingling. Patient denies recent trauma or falls.   Review of Systems  Musculoskeletal:  Positive for back pain and myalgias.  Neurological:  Negative for tingling, sensory change, focal  weakness and weakness.  All other systems reviewed and are negative. Otherwise per HPI.  Assessment & Plan: Visit Diagnoses:    ICD-10-CM   1. Chronic bilateral low back pain without sciatica  M54.50 Ambulatory referral to Physical Medicine Rehab   G89.29     2. Spondylosis without myelopathy or radiculopathy  M47.819     3. Scoliosis of thoracolumbar spine, unspecified scoliosis type  M41.9     4. Chronic bilateral thoracic back pain  M54.6    G89.29        Plan: Findings:  Chronic, worsening and severe bilateral lower back pain radiating to buttocks and hips. Patient continues to have excruciating pain despite good conservative therapies. We did discuss patient's recent lumbar x-rays with her using images and spine model. Patient's recent lumbar x-rays do show degenerative changes and we feel that based on her clinical presentation and exam her pain could be nerve vs facet mediated. We do not have a lumbar MRI to reference at this time. We believe the next step is to perform a diagnostic and hopefully therapeutic right L5-S1 interlaminar epidural steroid injection. Patient is going to be relocating to [place], [place] in approximately 1 month and we would like to get in her for diagnostic injection before she leaves. If patient gets good and sustained relief with injection we will monitor, however is she continues to have severe pain we would consider ordering lumbar MRI.  Chronic bilateral thoracic pain that we believe is consistent with myofascial pain and mechanical pain secondary to scoliosis. Patient should benefit from course of  physical therapy with focus on strengthening and posture. Patient states that she would like to start physical therapy after she gets moved. We are happy to provide patient with recommendations for physiatrist in [place] that could continue to manage her care after she moves.   We will contact Dr. Lyman Bishop with cardiology for permission to come  off Xarelto so that she can have epidural injection. No red flag symptoms noted upon exam today.    Meds & Orders: No orders of the defined types were placed in this encounter.   Orders Placed This Encounter  Procedures   Ambulatory referral to Physical Medicine Rehab    Follow-up: Return in about 1 week (around 09/14/2021) for Bilateral L5-S1 interlaminar epidural steroid injection.   Procedures: No procedures performed      Clinical History: No specialty comments available.   She reports that she quit smoking about 52 years ago. Her smoking use included cigarettes. She has never used smokeless tobacco. No results for input(s): HGBA1C, LABURIC in the last 8760 hours.  Objective:  VS:  HT:    WT:   BMI:     BP:(!) 144/78  HR:85bpm  TEMP: ( )  RESP:  Physical Exam HENT:     Head: Normocephalic and atraumatic.     Right Ear: Tympanic membrane normal.     Left Ear: Tympanic membrane normal.     Nose: Nose normal.     Mouth/Throat:     Mouth: Mucous membranes are moist.  Eyes:     Pupils: Pupils are equal, round, and reactive to light.  Cardiovascular:     Rate and Rhythm: Normal rate.     Pulses: Normal pulses.  Pulmonary:     Effort: Pulmonary effort is normal.  Abdominal:     General: Abdomen is flat. There is no distension.  Musculoskeletal:        General: Tenderness present.     Cervical back: Normal range of motion.     Comments: Pt is slow to rise from seated position to standing without difficulty. Good lumbar range of motion. Strong distal strength without clonus, no pain upon palpation of greater trochanters. Sensation intact bilaterally. Walks independently, gait steady.   Tenderness noted upon palpation of bilateral thoracic region. Good strength noted to bilateral upper extremities. Sensation intact.     Skin:    General: Skin is warm and dry.     Capillary Refill: Capillary refill takes less than 2 seconds.  Neurological:     General: No focal deficit  present.     Mental Status: She is alert.  Psychiatric:        Mood and Affect: Mood normal.    Ortho Exam  Imaging: No results found.  Past Medical/Family/Surgical/Social History: Medications & Allergies reviewed per EMR, new medications updated. Patient Active Problem List   Diagnosis Date Noted   Aortic atherosclerosis ([place]) 01/08/2021   Myalgia due to statin 01/08/2021   Pancreatic cyst 01/07/2021   Mixed hyperlipidemia 09/03/2018   Coronary artery disease involving native coronary artery of native heart without angina pectoris 05/21/2018   Persistent atrial fibrillation ([place]) 03/16/2017   Essential hypertension 06/13/2016   Dyslipidemia 06/13/2016   Annual physical exam 06/13/2016   Renal cell carcinoma ([place])    Renal cyst, left    Hypothyroidism 08/09/2012   CAD S/P percutaneous coronary angioplasty 08/09/2012   Essential hypertension, benign 08/09/2012   Pure hypercholesterolemia 08/09/2012   OSA on CPAP 08/09/2012   Osteoporosis 08/09/2012   Past  Medical History:  Diagnosis Date   Atrial fibrillation [facility])    a. initially diagnosed in 02/2017. Started on Eliquis   Back pain, chronic    Broken leg 1958   CAD (coronary artery disease)    a. s/p DES to LAD in 2008, patent by cath in 2012.   Hearing loss    bilateral hearing aids   History of echocardiogram 09/11/2007   normal   History of nuclear stress test 11/17/2010   exercise; small fixed anteroseptal defect (?artifact); short run of atrial-tachy during recovery; low risk   Hyperlipidemia    Hypertension    Hypothyroid    Impaired fasting glucose    Osteoporosis    Renal cell carcinoma right; 2011   Dr. Alinda Money, [facility]   Sleep apnea    on CPAP   Family History  Problem Relation Age of Onset   Heart disease Mother    Hypertension Mother    Arthritis Mother    Osteoporosis Mother    Depression Sister    Hyperlipidemia Sister    Heart disease Father    Diabetes Brother    Hyperlipidemia  Son    Heart disease Son 41       cardiac arrest, 4 blockages, stents   Cancer Sister        unsure, related to smoking?   Cancer Sister        lung cancer   Hyperlipidemia Brother    Hypertension Brother    Past Surgical History:  Procedure Laterality Date   ABDOMINAL HYSTERECTOMY  1975   PARTIAL; part of 1 ovary remains   CARDIAC CATHETERIZATION  10/08/2011   LAD-prox stent patent; no significant obstructive CAD; hi LVF end-diastolic pressure   CARDIOVERSION N/A 05/12/2017   Procedure: CARDIOVERSION;  Surgeon: [name], MD;  Location: [facility] ENDOSCOPY;  Service: Cardiovascular;  Laterality: N/A;   CATARACT EXTRACTION, BILATERAL Bilateral 08/2019   Dr. [name]   COLONOSCOPY  03/21/2006   polyps (hyperplastic polyps), diverticula, internal hemorrhoids; Dr. Benson Norway   COLONOSCOPY  07/26/2011   diverticula, medium hemorrhoids; no rec f/u unless sx   CORONARY ANGIOPLASTY WITH STENT PLACEMENT  2008   mid LAD   IR GENERIC HISTORICAL  08/18/2016   IR RADIOLOGIST EVAL & MGMT 08/18/2016 [name], MD [facility] INTERV RAD   IR RADIOLOGIST EVAL & MGMT  08/09/2018   IR RADIOLOGIST EVAL & MGMT  09/19/2019   KIDNEY SURGERY  2011   renal carcinoma - cryoablation -  Dr. Alinda Money and Dr. Laural Roes (cryoablation)   SPINE SURGERY     Social History   Occupational History   Occupation: retired Psychiatric nurse    Employer: [employer]  Tobacco Use   Smoking status: Former    Types: Cigarettes    Quit date: 12/19/1968    Years since quitting: 52.7   Smokeless tobacco: Never  Vaping Use   Vaping Use: Never used  Substance and Sexual Activity   Alcohol use: Yes    Alcohol/week: 2.0 standard drinks    Types: 2 Glasses of wine per week    Comment: 1-3 glasses of wine 3/week (occ martini)   Drug use: No   Sexual activity: Not Currently

## 2021-09-09 ENCOUNTER — Telehealth: Payer: Self-pay

## 2021-09-09 NOTE — Telephone Encounter (Signed)
   [facility] Pre-operative Risk Assessment    Patient Name: BRETTE CAST  DOB: 05/23/1941 MRN: [id number]  HEARTCARE STAFF:  - IMPORTANT!!!!!! Under Visit Info/Reason for Call, type in Other and utilize the format Clearance MM/DD/YY or Clearance TBD. Do not use dashes or single digits. - Please review there is not already an duplicate clearance open for this procedure. - If request is for dental extraction, please clarify the # of teeth to be extracted. - If the patient is currently at the dentist's office, call Pre-Op Callback Staff (MA/nurse) to input urgent request.  - If the patient is not currently in the dentist office, please route to the Pre-Op pool.  Request for surgical clearance:  What type of surgery is being performed? EPIDURAL INJECTION-ESI  When is this surgery scheduled? TBD  What type of clearance is required (medical clearance vs. Pharmacy clearance to hold med vs. Both)? PHARMACY  Are there any medications that need to be held prior to surgery and how long? Alveda Reasons -2 DAYS  Practice name and name of physician performing surgery? [facility]   What is the office phone number? [phone]   7.   What is the office fax number? [phone]  8.   Anesthesia type (None, local, MAC, general) ? NOT LISTED   Waylan Rocher 09/09/2021, 1:22 PM  _________________________________________________________________   (provider comments below)

## 2021-09-10 NOTE — Telephone Encounter (Signed)
Patient with diagnosis of afib on Xarelto for anticoagulation.    Procedure: epidural ESI Date of procedure: TBD  CHA2DS2-VASc Score = 5  This indicates a 7.2% annual risk of stroke. The patient's score is based upon: CHF History: 0 HTN History: 1 Diabetes History: 0 Stroke History: 0 Vascular Disease History: 1 Age Score: 2 Gender Score: 1   CrCl 47mL/min using adjusted body weight Platelet count 288K  Per office protocol, patient can hold Xarelto for 3 days prior to procedure.

## 2021-09-13 ENCOUNTER — Other Ambulatory Visit: Payer: Self-pay | Admitting: Physician Assistant

## 2021-09-13 NOTE — Telephone Encounter (Signed)
   Name: Theresa Mccarty  DOB: 05-25-1941  MRN: [id number]   Primary Cardiologist: [name], MD  Chart reviewed as part of pre-operative protocol coverage. Patient was contacted 09/13/2021 in reference to pre-operative risk assessment for pending surgery as outlined below.  Theresa Mccarty was last seen 04/2021 by Dr. Claiborne Billings (OSA) and 08/2020 by [name].  I spoke with the patient to assess clearance over the phone and she states she would prefer to have in-person discussion with [name] next week at scheduled appt 09/20/21 to review her finalized cardiac clearance. Will route this note as FYI only since it's coming up in 1 week. ([name] - no response needed. FYI only.)  Have added "pre-procedure" clearance to appt notes as reminder.  Pharmacy team has left recommendations below for [name] to reference at time OV. Per office protocol, the provider should assess clearance at time of office visit and should forward their finalized clearance decision to requesting party below. I will route this as FYI to requesting procedure team and remove this message from the pre-op box.  Charlie Pitter, PA-C 09/13/2021, 11:43 AM

## 2021-09-14 ENCOUNTER — Encounter: Payer: Self-pay | Admitting: Physical Medicine and Rehabilitation

## 2021-09-14 ENCOUNTER — Encounter (HOSPITAL_BASED_OUTPATIENT_CLINIC_OR_DEPARTMENT_OTHER): Payer: Self-pay

## 2021-09-14 NOTE — Telephone Encounter (Signed)
Pt returning phone call for Dr. Romona Curls office. Unsure who called her but her call back number is [phone].

## 2021-09-16 NOTE — Telephone Encounter (Signed)
I spoke with Theresa Mccarty.  She actually wanted advised with regards to how much steroid gets systemically after an epidural injection.  She had the below reactions to the oral steroids after she was on them for a while and she just wants to make sure that there is not a significant chance of having the same kind of significant reaction to her after an epidural steroid injection.  I did tell her that some does get systemically with I know knee injections but she was wanting further information with regarding to her back injection.  If you want I can call her back if you can give me a specific answer

## 2021-09-16 NOTE — Telephone Encounter (Signed)
I spoke with the patient she is going to think about it and as she is moving she may defer until she gets to [employer]

## 2021-09-20 ENCOUNTER — Other Ambulatory Visit: Payer: Self-pay

## 2021-09-20 ENCOUNTER — Ambulatory Visit (HOSPITAL_BASED_OUTPATIENT_CLINIC_OR_DEPARTMENT_OTHER): Payer: Medicare Other | Admitting: Internal Medicine

## 2021-09-20 ENCOUNTER — Telehealth: Payer: Self-pay | Admitting: Physical Medicine and Rehabilitation

## 2021-09-20 VITALS — BP 140/90 | HR 81 | Ht 62.5 in | Wt 157.9 lb

## 2021-09-20 DIAGNOSIS — Z9989 Dependence on other enabling machines and devices: Secondary | ICD-10-CM

## 2021-09-20 DIAGNOSIS — Z01810 Encounter for preprocedural cardiovascular examination: Secondary | ICD-10-CM

## 2021-09-20 DIAGNOSIS — I4819 Other persistent atrial fibrillation: Secondary | ICD-10-CM | POA: Diagnosis not present

## 2021-09-20 DIAGNOSIS — I2510 Atherosclerotic heart disease of native coronary artery without angina pectoris: Secondary | ICD-10-CM | POA: Diagnosis not present

## 2021-09-20 DIAGNOSIS — G4733 Obstructive sleep apnea (adult) (pediatric): Secondary | ICD-10-CM | POA: Diagnosis not present

## 2021-09-20 DIAGNOSIS — Z7901 Long term (current) use of anticoagulants: Secondary | ICD-10-CM | POA: Diagnosis not present

## 2021-09-20 DIAGNOSIS — Z9861 Coronary angioplasty status: Secondary | ICD-10-CM | POA: Diagnosis not present

## 2021-09-20 NOTE — Telephone Encounter (Signed)
Pt called wanting to get an appt for a steriod inj   [phone]

## 2021-09-20 NOTE — Progress Notes (Addendum)
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 Recently, she developed substernal chest pain in the fall and underwent cardiac catheterization. This demonstrated patent LAD stent that was proximal as well as 30% disease of the LAD just prior to that stent, but no other areas of obstructive coronary disease. She continued to have chest pain; however, I felt it was most likely related to stress. I have also encouraged dietary changes as well as trying to reduce her stress and alcohol intake. She has accomplished that to some extent and, over the past 6 months has had no other symptoms of chest discomfort. She occasionally gets short of breath with heavy exertion and has some swelling in her legs only when standing up for long periods of time. She continues to work at [employer] although there is some stress she is actually maintaining her exercise regimen. This is clearly demonstrated improvement in her risk factors including a reduction in cholesterol. A recent lipid NMR demonstrated a particle number of 631 and an LDL cholesterol of 81.   Theresa Mccarty returns for annual followup today. She reports feeling very well. She recently had the shingles vaccine at [facility]. She had some questions today about starting coconut oil glucosamine/chondroitin which I think are fine. She did have one episode of discomfort in her chest after working in regard for which she took nitroglycerin however she noted no improvement in her symptoms. She is very interested in decreasing her Lipitor she does report some soreness in her arm muscles as well as her leg muscles. She believes this is related to the statin. She has not been on any other statin since her stent was placed in 2008. She is interested a recheck of her  cholesterol today as well as a screening hemoglobin A1c - she is nondiabetic.  Finally, she recently had a fall at home which was due to tripping over a table leg. She did have some soreness in her right hand as well as ecchymosis and swelling of her digits and 2 bandages on her right toes.  Theresa Mccarty has no specific complaints today. She denies any chest pain or worsening shortness of breath. Blood pressure is at goal. She continues to take aspirin and Plavix without any bleeding complications. Cholesterol is due for recheck. She does report some mild calf tenderness which she attributes to a atorvastatin.  06/13/2016  Theresa Mccarty returns today for follow-up. Over the past year she's done fairly well. She's had some struggles with arthritis. She noted that she was having some side effects related to atorvastatin including weakness in her arm which improved after discontinuing the medicine. She says she's had about 8 pound weight loss however we demonstrated only 1-2 pounds in the office. She is currently on red yeast rice and wishes to have a fasting lipid profile today. She denies any significant chest discomfort or worsening shortness of breath. She recently retired from her job and notes less stress in her life. Blood pressure is well-controlled today.  04/20/2017  Theresa Mccarty was seen today in follow-up. Recently she was seen by Theresa Mccarty and had symptoms of worsening fatigue and palpitations. She was found to be in A. fib with RVR. He adjusted some medications for rate control including adding diltiazem and started her on Eliquis.  She reports she's been taking Eliquis for the past several weeks however did miss at least one dose possibly 2. Today heart rate is just below 100 and her rhythm is irregular. She reports she feels better but still is fatigued. We discussed the possibility of a cardioversion attempt. She understands she would need at least 3 weeks of uninterrupted anticoagulation or TEE guided approach  in order to do that. She feels that she could be more compliant with medication.  06/16/2017  Theresa Mccarty returns today for follow-up. She underwent cardioversion which was unsuccessful. Subsequently she said that she does not think that she was terribly symptomatic therefore we proceeded rate controlled. I increased her diltiazem and heart rate is well-controlled in the 80s today. She reports no significant fatigue. She is switched from Eliquis to Xarelto due to the fact that she had trouble remembering the second dose of Eliquis. Blood pressure was elevated somewhat initially 154/95 however came down to 138/88. She also reports some abdominal bloating but his recent started on probiotics. She gets some lower extremity swelling but has some evidence of venous insufficiency. She is on hydrochlorothiazide with Diovan.  12/04/2017  Theresa Mccarty was seen today in follow-up.  She continues to be without complaints.  She denies any chest pain or worsening shortness of breath.  She remains in persistent if not permanent atrial fibrillation.  She has been on Xarelto which is been easier for her to remember however she is concerned about the cost, particularly now that she is in the donut hole is causing her about $500 a month.  Blood pressure is pretty well controlled.  She showed me numbers at home indicating blood pressures between 338 and 250 systolic.  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  05/21/2018  Theresa Mccarty was seen today in follow-up.  She denies chest pain or shortness of breath.  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  She is in persistent if not permanent atrial fibrillation.  Her family recently purchased a Theresa Mccarty mobile watch for her to monitor her a-fib and she has the ability to send EKGs to me.  Blood pressure is reasonably well controlled today.  We recently discussed her  lipid profile which in March showed an LDL of 84 with total cholesterol 183.  She has been taking ezetimibe but is statin intolerant.  Her goal LDL is less than 70 given her history of coronary artery disease and she may be a candidate for PCSK9 inhibitor therapy.  In the past she had intolerance to atorvastatin, but did not recall she had tried rosuvastatin.  09/03/2018  Theresa Mccarty returns today for follow-up.  She is now getting up for furniture market next week.  She denies any chest pain or worsening shortness of breath.  She is noted to be in permanent A. fib with rate control.  She says she is tolerated changes in her medications including the recent addition of ezetimibe.  Recent lipid profile indicated total cholesterol 160, HDL 100 LDL 51 and triglycerides 46.  Blood pressure appears well-controlled and she is asymptomatic at this time.  08/13/2019  Theresa Mccarty seen today in follow-up.  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  She has stopped taking her statin medication.  She found that  she was having some chest discomfort that resolved.  She continues on ezetimibe.  Her labs have been well controlled but she is due for recheck lipid profile.  Blood pressure was elevated today 150/90.  She says at home is generally 110-130s.  She brought in cardia mobile strips which shows that she is in A. fib almost permanently.  02/17/2020  Theresa Mccarty returns for follow-up.  She reports some occasional shortness of breath.  She says she still exercises regularly.  She is due for her first Covid vaccine in a few days.  She reports compliance with CPAP and has a follow-up appoint with Dr. Claiborne Billings in a few weeks.  She has been having some issues with going through the water reservoir very rapidly.  Blood pressure initially was elevated today however came down to 148/88.  She is overdue for lab  work.  09/14/2020  Theresa Mccarty is seen today in follow-up.  She reports that she still struggles with her weight.  She is concerned about intermittent weight gain.  She has some dyspnea despite fairly regular treadmill exercise.  Blood pressure is actually well controlled today.  She is overall unaware of her A. fib and has been asymptomatic with this.  She is rate controlled at 82.  She denies any bleeding issues on Xarelto.  09/20/2021  Theresa Mccarty is seen today in follow-up.  She has a number of concerns today mostly I think anxiety related to her upcoming procedure.  She is requesting cardiac clearance as she is planning to undergo a spinal injection.  She will need to hold her Xarelto 3 days prior to the procedure.  She could start it more than 24 hours afterwards.  If necessary, can hold aspirin up to 7 days prior to the procedure and start afterwards.  Also tells me that she plans to move to live with her son in the [place] area at some point this year.  She will be trying to transition cardiology care there.  PMHx:  Past Medical History:  Diagnosis Date   Atrial fibrillation ([place])    a. initially diagnosed in 02/2017. Started on Eliquis   Back pain, chronic    Broken leg 1958   CAD (coronary artery disease)    a. s/p DES to LAD in 2008, patent by cath in 2012.   Hearing loss    bilateral hearing aids   History of echocardiogram 09/11/2007   normal   History of nuclear stress test 11/17/2010   exercise; small fixed anteroseptal defect (?artifact); short run of atrial-tachy during recovery; low risk   Hyperlipidemia    Hypertension    Hypothyroid    Impaired fasting glucose    Osteoporosis    Renal cell carcinoma right; 2011   Dr. Alinda Money, [facility]   Sleep apnea    on CPAP    Past Surgical History:  Procedure Laterality Date   ABDOMINAL HYSTERECTOMY  1975   PARTIAL; part of 1 ovary remains   CARDIAC CATHETERIZATION  10/08/2011   LAD-prox stent patent; no significant  obstructive CAD; hi LVF end-diastolic pressure   CARDIOVERSION N/A 05/12/2017   Procedure: CARDIOVERSION;  Surgeon: [name], MD;  Location: [place];  Service: Cardiovascular;  Laterality: N/A;   CATARACT EXTRACTION, BILATERAL Bilateral 08/2019   Dr. [name]   COLONOSCOPY  03/21/2006   polyps (hyperplastic polyps), diverticula, internal hemorrhoids; Dr. Benson Norway   COLONOSCOPY  07/26/2011   diverticula, medium hemorrhoids; no rec f/u unless sx   CORONARY ANGIOPLASTY WITH STENT PLACEMENT  2008   mid LAD   IR GENERIC HISTORICAL  08/18/2016   IR RADIOLOGIST EVAL & MGMT 08/18/2016 [name], MD [facility] INTERV RAD   IR RADIOLOGIST EVAL & MGMT  08/09/2018   IR RADIOLOGIST EVAL & MGMT  09/19/2019   KIDNEY SURGERY  2011   renal carcinoma - cryoablation -  Dr. Alinda Money and Dr. Laural Roes (cryoablation)   SPINE SURGERY      FAMHx:  Family History  Problem Relation Age of Onset   Heart disease Mother    Hypertension Mother    Arthritis Mother    Osteoporosis Mother    Depression Sister    Hyperlipidemia Sister    Heart disease Father    Diabetes Brother    Hyperlipidemia Son    Heart disease Son 11       cardiac arrest, 4 blockages, stents   Cancer Sister        unsure, related to smoking?   Cancer Sister        lung cancer   Hyperlipidemia Brother    Hypertension Brother     SOCHx:   reports that she quit smoking about 52 years ago. Her smoking use included cigarettes. She has never used smokeless tobacco. She reports current alcohol use of about 2.0 standard drinks per week. She reports that she does not use drugs.  ALLERGIES:  Allergies  Allergen Reactions   Atorvastatin Other (See Comments)    Muscle aches   Celebrex [Celecoxib] Other (See Comments)    Muscle aches   Nitrofurantoin Nausea Only and Rash    ROS: Pertinent items noted in HPI and remainder of comprehensive ROS otherwise negative.  HOME MEDS: Current Outpatient Medications  Medication Sig Dispense  Refill   alendronate (FOSAMAX) 70 MG tablet TAKE 1 TABLET BY MOUTH EVERY 7 DAYS. TAKE WITH A FULL GLASS OF WATER ON AN EMPTY STOMACH. 12 tablet 1   aspirin EC 81 MG tablet Take 1 tablet (81 mg total) by mouth daily. 90 tablet 3   B Complex Vitamins (B-COMPLEX/B-12 PO) Take 1 tablet by mouth daily.     Cholecalciferol (VITAMIN D) 2000 units tablet Take 2,000 Units by mouth daily.     Coenzyme Q10 (COQ10) 100 MG CAPS Take 1 tablet by mouth daily.      diltiazem (CARDIZEM CD) 240 MG 24 hr capsule TAKE 1 CAPSULE BY MOUTH EVERY DAY 90 capsule 2   ezetimibe (ZETIA) 10 MG tablet TAKE 1 TABLET BY MOUTH EVERY DAY 90 tablet 3   levothyroxine (SYNTHROID) 50 MCG tablet TAKE 1 TABLET BY MOUTH EVERY DAY 90 tablet 3   metoprolol succinate (TOPROL-XL) 25 MG 24 hr tablet Take 25 mg by mouth daily.     MILK THISTLE PO Take 1 capsule by mouth daily.     nitroGLYCERIN (NITROSTAT) 0.4 MG SL tablet PLACE 1 TABLET (0.4 MG TOTAL) UNDER THE TONGUE EVERY 5 (FIVE) MINUTES AS NEEDED FOR CHEST PAIN. 25 tablet 4   Probiotic Product (PROBIOTIC PO) Take 1 capsule by mouth daily.     valsartan-hydrochlorothiazide (DIOVAN-HCT) 320-25 MG tablet TAKE 1 TABLET BY MOUTH EVERY DAY 90 tablet 3   XARELTO 20 MG TABS tablet TAKE 1 TABLET BY MOUTH DAILY WITH SUPPER 90 tablet 1   Zinc 100 MG TABS      No current facility-administered medications for this visit.    LABS/IMAGING: No results found for this or any previous visit (from the past 48 hour(s)). No results found.  VITALS: BP 140/90  Pulse 81   Ht 5' 2.5" (1.588 m)   Wt 157 lb 14.4 oz (71.6 kg)   BMI 28.42 kg/m   EXAM: General appearance: alert and no distress Neck: no carotid bruit and no JVD Lungs: clear to auscultation bilaterally Heart: irregularly irregular rhythm Abdomen: soft, non-tender; bowel sounds normal; no masses,  no organomegaly Extremities: extremities normal, atraumatic, no cyanosis or edema Pulses: 2+ and symmetric Skin: Skin color, texture,  turgor normal. No rashes or lesions Neurologic: Grossly normal Psych: Pleasant  EKG: A. fib at 81, inferior ST and T wave changes-personally reviewed  ASSESSMENT: Persistent atrial fibrillation with controlled ventricular response - failed cardioversion CHADSVASC score of 4 on Xarelto Coronary artery disease status post PCI of the proximal LAD in 2008 Hypertension - controlled Hyperlipidemia -not at goal LDL less than 70 Statin intolerance Obstructive sleep apnea on CPAP - 100% compliant Arthritis  PLAN: 1.   Mrs. Bordas seems to have decent control of her A. fib.  It would be okay for her to electively interrupt her anticoagulation for spinal injection.  Hold Xarelto 3 days prior to the procedure and can hold aspirin up to 7 days prior if necessary and restart after.  She may be transitioning to a new cardiologist as she is moving to [place] area.  I suggested [facility].  Follow-up with me as needed.  [name], MD, [facility], [place] Director of the Advanced Lipid Disorders &  Cardiovascular Risk Reduction Clinic Attending Cardiologist  Direct Dial: [phone]  Fax: [phone]  Website:  [email or web address] 09/20/2021, 8:55 AM

## 2021-09-20 NOTE — Patient Instructions (Signed)

## 2021-09-20 NOTE — Telephone Encounter (Signed)
Scheduled

## 2021-09-23 ENCOUNTER — Encounter: Payer: Self-pay | Admitting: Physical Medicine and Rehabilitation

## 2021-09-23 ENCOUNTER — Ambulatory Visit (INDEPENDENT_AMBULATORY_CARE_PROVIDER_SITE_OTHER): Payer: Medicare Other | Admitting: Physical Medicine and Rehabilitation

## 2021-09-23 ENCOUNTER — Other Ambulatory Visit: Payer: Self-pay

## 2021-09-23 ENCOUNTER — Ambulatory Visit: Payer: Self-pay

## 2021-09-23 VITALS — BP 133/83 | HR 98

## 2021-09-23 DIAGNOSIS — M5459 Other low back pain: Secondary | ICD-10-CM | POA: Diagnosis not present

## 2021-09-23 DIAGNOSIS — M47819 Spondylosis without myelopathy or radiculopathy, site unspecified: Secondary | ICD-10-CM

## 2021-09-23 MED ORDER — METHYLPREDNISOLONE ACETATE 80 MG/ML IJ SUSP
80.0000 mg | Freq: Once | INTRAMUSCULAR | Status: AC
  Administered 2021-09-23: 80 mg

## 2021-09-23 NOTE — Progress Notes (Signed)
Pt state lower back pain. Pt state walking and standing makes the pain worse. Pt state she takes pain meds, heating pads and pain patches to help ease her pain.  Numeric Pain Rating Scale and Functional Assessment Average Pain 5   In the last MONTH (on 0-10 scale) has pain interfered with the following?  1. General activity like being  able to carry out your everyday physical activities such as walking, climbing stairs, carrying groceries, or moving a chair?  Rating(9)   +Driver, -BT(xarelto), -Dye Allergies.

## 2021-09-23 NOTE — Patient Instructions (Signed)

## 2021-09-24 ENCOUNTER — Encounter: Payer: Self-pay | Admitting: Family Medicine

## 2021-09-26 ENCOUNTER — Other Ambulatory Visit: Payer: Self-pay

## 2021-09-26 ENCOUNTER — Ambulatory Visit
Admission: RE | Admit: 2021-09-26 | Discharge: 2021-09-26 | Disposition: A | Discharge status: Home / Self Care | Payer: Medicare Other | Source: Ambulatory Visit | Attending: Gastroenterology | Admitting: Gastroenterology

## 2021-09-26 DIAGNOSIS — K862 Cyst of pancreas: Secondary | ICD-10-CM

## 2021-09-26 DIAGNOSIS — R935 Abnormal findings on diagnostic imaging of other abdominal regions, including retroperitoneum: Secondary | ICD-10-CM | POA: Diagnosis not present

## 2021-09-26 DIAGNOSIS — N281 Cyst of kidney, acquired: Secondary | ICD-10-CM | POA: Diagnosis not present

## 2021-09-26 DIAGNOSIS — K760 Fatty (change of) liver, not elsewhere classified: Secondary | ICD-10-CM | POA: Diagnosis not present

## 2021-09-26 MED ORDER — GADOBENATE DIMEGLUMINE 529 MG/ML IV SOLN
15.0000 mL | Freq: Once | INTRAVENOUS | Status: AC | PRN
  Administered 2021-09-26: 15 mL via INTRAVENOUS

## 2021-09-27 NOTE — Progress Notes (Signed)
Theresa Mccarty - 80 y.o. female MRN [id number]  Date of birth: 1941/07/21  Office Visit Note: Visit Date: 09/23/2021 PCP: [name], MD Referred by: [name], MD  Subjective: Chief Complaint  Patient presents with   Lower Back - Pain   HPI:  Theresa Mccarty is a 80 y.o. female who comes in today for planned Midline L5-S1 Lumbar Interlaminar epidural steroid injection with fluoroscopic guidance.  The patient has failed conservative care including home exercise, medications, time and activity modification.  This injection will be diagnostic and hopefully therapeutic.  Please see requesting physician notes for further details and justification. Will restart Xarelto tonight.   ROS Otherwise per HPI.  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   Meds & Orders:  Meds ordered this encounter  Medications   methylPREDNISolone acetate (DEPO-MEDROL) injection 80 mg    Orders Placed This Encounter  Procedures   XR C-ARM NO REPORT   Epidural Steroid injection    Follow-up: Return if symptoms worsen or fail to improve.   Procedures: No procedures performed  Lumbar Epidural Steroid Injection - Interlaminar Approach with Fluoroscopic Guidance  Patient: Theresa Mccarty      Date of Birth: [birth year] MRN: [id number] PCP: [name], MD      Visit Date: 09/23/2021   Universal Protocol:     Consent Given By: the patient  Position: PRONE  Additional Comments: Vital signs were monitored before and after the procedure. Patient was prepped and draped in the usual sterile fashion. The correct patient, procedure, and site was verified.   Injection Procedure Details:   Procedure diagnoses: Spondylosis without myelopathy or radiculopathy [M47.819]   Meds Administered:   Meds ordered this encounter  Medications   methylPREDNISolone acetate (DEPO-MEDROL) injection 80 mg     Laterality: Right  Location/Site:  L5-S1  Needle: 3.5 in., 20 ga. Tuohy  Needle Placement: Paramedian epidural  Findings:   -Comments: Excellent flow of contrast into the epidural space.  Procedure Details: Using a paramedian approach from the side mentioned above, the region overlying the inferior lamina was localized under fluoroscopic visualization and the soft tissues overlying this structure were infiltrated with 4 ml. of 1% Lidocaine without Epinephrine. The Tuohy needle was inserted into the epidural space using a paramedian approach.   The epidural space was localized using loss of resistance along with counter oblique bi-planar fluoroscopic views.  After negative aspirate for air, blood, and CSF, a 2 ml. volume of Isovue-250 was injected into the epidural space and the flow of contrast was observed. Radiographs were obtained for documentation purposes.    The injectate was administered into the level noted above.   Additional Comments:  The patient tolerated the procedure well Dressing: 2 x 2 sterile gauze and Band-Aid    Post-procedure details: Patient was observed during the procedure. Post-procedure instructions were reviewed.  Patient left the clinic in stable condition.    Clinical History: No specialty comments available.     Objective:  VS:  HT:    WT:   BMI:     BP:133/83  HR:98bpm  TEMP: ( )  RESP:  Physical Exam Vitals and nursing note reviewed.  Constitutional:      General: She is not in acute distress.  Appearance: Normal appearance. She is not ill-appearing.  HENT:     Head: Normocephalic and atraumatic.     Right Ear: External ear normal.     Left Ear: External ear normal.  Eyes:     Extraocular Movements: Extraocular movements intact.  Cardiovascular:     Rate and Rhythm: Normal rate.     Pulses: Normal pulses.  Pulmonary:      Effort: Pulmonary effort is normal. No respiratory distress.  Abdominal:     General: There is no distension.     Palpations: Abdomen is soft.  Musculoskeletal:        General: Tenderness present.     Cervical back: Neck supple.     Right lower leg: No edema.     Left lower leg: No edema.     Comments: Patient has good distal strength with no pain over the greater trochanters.  No clonus or focal weakness.  Skin:    Findings: No erythema, lesion or rash.  Neurological:     General: No focal deficit present.     Mental Status: She is alert and oriented to person, place, and time.     Sensory: No sensory deficit.     Motor: No weakness or abnormal muscle tone.     Coordination: Coordination normal.  Psychiatric:        Mood and Affect: Mood normal.        Behavior: Behavior normal.     Imaging: No results found.

## 2021-09-27 NOTE — Procedures (Signed)
Lumbar Epidural Steroid Injection - Interlaminar Approach with Fluoroscopic Guidance  Patient: Theresa Mccarty      Date of Birth: [birth year] MRN: [id number] PCP: [name], MD      Visit Date: 09/23/2021   Universal Protocol:     Consent Given By: the patient  Position: PRONE  Additional Comments: Vital signs were monitored before and after the procedure. Patient was prepped and draped in the usual sterile fashion. The correct patient, procedure, and site was verified.   Injection Procedure Details:   Procedure diagnoses: Spondylosis without myelopathy or radiculopathy [M47.819]   Meds Administered:  Meds ordered this encounter  Medications   methylPREDNISolone acetate (DEPO-MEDROL) injection 80 mg     Laterality: Right  Location/Site:  L5-S1  Needle: 3.5 in., 20 ga. Tuohy  Needle Placement: Paramedian epidural  Findings:   -Comments: Excellent flow of contrast into the epidural space.  Procedure Details: Using a paramedian approach from the side mentioned above, the region overlying the inferior lamina was localized under fluoroscopic visualization and the soft tissues overlying this structure were infiltrated with 4 ml. of 1% Lidocaine without Epinephrine. The Tuohy needle was inserted into the epidural space using a paramedian approach.   The epidural space was localized using loss of resistance along with counter oblique bi-planar fluoroscopic views.  After negative aspirate for air, blood, and CSF, a 2 ml. volume of Isovue-250 was injected into the epidural space and the flow of contrast was observed. Radiographs were obtained for documentation purposes.    The injectate was administered into the level noted above.   Additional Comments:  The patient tolerated the procedure well Dressing: 2 x 2 sterile gauze and Band-Aid    Post-procedure details: Patient was observed during the procedure. Post-procedure instructions were reviewed.  Patient left the  clinic in stable condition.

## 2021-09-29 ENCOUNTER — Other Ambulatory Visit: Payer: Self-pay

## 2021-09-29 ENCOUNTER — Other Ambulatory Visit (INDEPENDENT_AMBULATORY_CARE_PROVIDER_SITE_OTHER): Payer: Medicare Other

## 2021-09-29 DIAGNOSIS — Z23 Encounter for immunization: Secondary | ICD-10-CM

## 2021-10-13 ENCOUNTER — Telehealth: Payer: Self-pay | Admitting: Internal Medicine

## 2021-10-13 MED ORDER — RIVAROXABAN 20 MG PO TABS: 20.0000 mg | ORAL_TABLET | Freq: Every day | ORAL | 1 refills | Status: DC

## 2021-10-13 NOTE — Telephone Encounter (Signed)
Prescription refill request for Xarelto received.  Last office visit:hilty 09/20/21 Weight:71.6kg Age:32f Scr: 0.79 03/05/21 CrCl: 64.2

## 2021-10-13 NOTE — Telephone Encounter (Signed)
*  STAT* If patient is at the pharmacy, call can be transferred to refill team.   1. Which medications need to be refilled? (please list name of each medication and dose if known) new prescription, changing pharmacy-Xarelto  2. Which pharmacy/location (including street and city if local pharmacy) is medication to be sent to? [facility] [address], [place], [place]-   3. Do they need a 30 day or 90 day supply? 90 days and refills

## 2021-10-26 DIAGNOSIS — G4733 Obstructive sleep apnea (adult) (pediatric): Secondary | ICD-10-CM | POA: Diagnosis not present

## 2021-10-30 ENCOUNTER — Other Ambulatory Visit: Payer: Self-pay | Admitting: Physician Assistant

## 2021-11-22 DIAGNOSIS — N3000 Acute cystitis without hematuria: Secondary | ICD-10-CM | POA: Diagnosis not present

## 2021-11-22 DIAGNOSIS — R350 Frequency of micturition: Secondary | ICD-10-CM | POA: Diagnosis not present

## 2021-11-22 DIAGNOSIS — R030 Elevated blood-pressure reading, without diagnosis of hypertension: Secondary | ICD-10-CM | POA: Diagnosis not present

## 2021-12-09 ENCOUNTER — Encounter: Payer: Self-pay | Admitting: Family Medicine

## 2021-12-09 ENCOUNTER — Ambulatory Visit (INDEPENDENT_AMBULATORY_CARE_PROVIDER_SITE_OTHER): Payer: Medicare Other | Admitting: Family Medicine

## 2021-12-09 ENCOUNTER — Other Ambulatory Visit: Payer: Self-pay

## 2021-12-09 ENCOUNTER — Telehealth: Payer: Self-pay | Admitting: Family Medicine

## 2021-12-09 VITALS — BP 150/71 | HR 86 | Ht 62.5 in | Wt 151.0 lb

## 2021-12-09 DIAGNOSIS — N3090 Cystitis, unspecified without hematuria: Secondary | ICD-10-CM | POA: Diagnosis not present

## 2021-12-09 MED ORDER — SULFAMETHOXAZOLE-TRIMETHOPRIM 800-160 MG PO TABS: 1.0000 | ORAL_TABLET | Freq: Two times a day (BID) | ORAL | 0 refills | Status: AC

## 2021-12-09 NOTE — Telephone Encounter (Signed)
I reviewed her urine culture from [facility], and it did not grow anything (didn't indicate infection).  I'm happy to do a virtual visit with her if that is what she wants  (She moved away, out of town)

## 2021-12-09 NOTE — Progress Notes (Signed)
Start time: 2:36 End time: 3:04  Virtual Visit via Video Note  I connected with Gordy Levan on 12/09/21 by a video enabled telemedicine application and verified that I am speaking with the correct person using two identifiers.  Location: Patient: home [place], [place]) Provider: office   I discussed the limitations of evaluation and management by telemedicine and the availability of in person appointments. The patient expressed understanding and agreed to proceed.  History of Present Illness:  Chief Complaint  Patient presents with   Urinary Frequency    VIRTUAL urinary frequency and burning and then when she goes she does not go very much that started last night. Had UTI 12/5-treated at home with AZO and cranberry. Then went to UC and was treated and seemed to clear up but now symptoms have returned. She feels like she may be dehydrated, forgets to drink because she is so busy with moving.    12/5 she went to UC--She had been having urinary frequency, dysuria (her typical UTI symptoms) for 3 days, not improved with azo and cranberry.  No hematuria. She had some soreness in her stomach, which she related to straining to void. At [facility] she was prescribed cefdinir 300mg  BID x 5 days.  On the last day she felt her body tingling. Her symptoms resolved after 2 days of antibiotics. She recalls prior antibiotics worked faster.  Early this morning, she got up to void and she noticed some burning again. She noticed a slight odor to the urine a couple of days ago, none since. She has recurrent urgency and frequency, but milder. Denies hematuria. She denies fever/chills. Denies abdominal pain, denies flank pain.  PMH, PSH, SH reviewed  Outpatient Encounter Medications as of 12/09/2021  Medication Sig Note   alendronate (FOSAMAX) 70 MG tablet TAKE 1 TABLET BY MOUTH EVERY 7 DAYS. TAKE WITH A FULL GLASS OF WATER ON AN EMPTY STOMACH.    aspirin EC 81 MG tablet Take 1 tablet (81 mg total) by mouth  daily.    B Complex Vitamins (B-COMPLEX/B-12 PO) Take 1 tablet by mouth daily.    Cholecalciferol (VITAMIN D) 2000 units tablet Take 2,000 Units by mouth daily.    Coenzyme Q10 (COQ10) 100 MG CAPS Take 1 tablet by mouth daily.     diltiazem (CARDIZEM CD) 240 MG 24 hr capsule TAKE 1 CAPSULE BY MOUTH EVERY DAY    ezetimibe (ZETIA) 10 MG tablet TAKE 1 TABLET BY MOUTH EVERY DAY    levothyroxine (SYNTHROID) 50 MCG tablet TAKE 1 TABLET BY MOUTH EVERY DAY    metoprolol succinate (TOPROL-XL) 25 MG 24 hr tablet Take 25 mg by mouth daily.    MILK THISTLE PO Take 1 capsule by mouth daily.    rivaroxaban (XARELTO) 20 MG TABS tablet Take 1 tablet (20 mg total) by mouth daily with supper.    valsartan-hydrochlorothiazide (DIOVAN-HCT) 320-25 MG tablet TAKE 1 TABLET BY MOUTH EVERY DAY    Zinc 100 MG TABS     cefdinir (OMNICEF) 300 MG capsule Take 300 mg by mouth 2 (two) times daily. (Patient not taking: Reported on 12/09/2021) 12/09/2021: Given by UC 12/5 for previous UTI.   nitroGLYCERIN (NITROSTAT) 0.4 MG SL tablet PLACE 1 TABLET (0.4 MG TOTAL) UNDER THE TONGUE EVERY 5 (FIVE) MINUTES AS NEEDED FOR CHEST PAIN. (Patient not taking: Reported on 12/09/2021) 12/09/2021: As needed   [DISCONTINUED] Probiotic Product (PROBIOTIC PO) Take 1 capsule by mouth daily.    No facility-administered encounter medications on file as of  12/09/2021.   Allergies  Allergen Reactions   Atorvastatin Other (See Comments)    Muscle aches   Celebrex [Celecoxib] Other (See Comments)    Muscle aches   Nitrofurantoin Nausea Only and Rash   Celebrex--caused muscle aches, no rash  ROS:  no fever, chills, URI symptoms, chest pain, shortness of breath. Denies n/v/d. Sometimes stools are looser and it is hard to keep clean. No f/c Denies rashes, bruising. She has noted some LH.  She is busy moving in, forgets to drink her water.    Observations/Objective:  BP (!) 150/71    Pulse 86    Ht 5' 2.5" (1.588 m)    Wt 151 lb (68.5 kg)     BMI 27.18 kg/m   Pleasant, well-appearing female in no distress. She is alert, oriented. Cranial nerves grossly intact. Exam is limited due to virtual nature of the visit.  Assessment and Plan:  Recurrent cystitis - Plan: sulfamethoxazole-trimethoprim (BACTRIM DS) 800-160 MG tablet  Treat presumptively (unable to collect culture, pt OOT). If symptoms recur or don't clear up, needs in-person evaluation. Change to cipro if can't tolerate (muscle aches recur)   Follow Up Instructions:    I discussed the assessment and treatment plan with the patient. The patient was provided an opportunity to ask questions and all were answered. The patient agreed with the plan and demonstrated an understanding of the instructions.   The patient was advised to call back or seek an in-person evaluation if the symptoms worsen or if the condition fails to improve as anticipated.  I spent 30 minutes dedicated to the care of this patient, including pre-visit review of records, face to face time, post-visit ordering of testing and documentation.    Vikki Ports, MD

## 2021-12-09 NOTE — Patient Instructions (Signed)
Drink plenty of water. Take the antibiotics twice daily for a week. Contact us if you develop side effects or allergic reaction. If your symptoms don't clear up, or if they recur again you will need to have an in-person evaluation (and to include a pelvic exam).  I hope you feel better soon!

## 2021-12-09 NOTE — Telephone Encounter (Signed)
Got pt scheduled 

## 2021-12-09 NOTE — Telephone Encounter (Signed)
Pt called and said she went to a [facility] urgent care where she lives now and was diagnosed with a UTI. She said they gave her medicine that helped for a little while but she is now having the same symptoms. She tried to call [facility] but they are closed today so her only other option was to check with you to see if you could call her in something or would you like for me to schedule a virtual?

## 2022-01-20 ENCOUNTER — Other Ambulatory Visit: Payer: Self-pay | Admitting: Internal Medicine

## 2022-01-26 ENCOUNTER — Ambulatory Visit: Payer: Medicare Other | Admitting: Family Medicine

## 2022-02-23 ENCOUNTER — Other Ambulatory Visit: Payer: Self-pay | Admitting: Family Medicine

## 2022-02-23 DIAGNOSIS — E039 Hypothyroidism, unspecified: Secondary | ICD-10-CM

## 2022-02-25 ENCOUNTER — Encounter: Payer: Self-pay | Admitting: Family Medicine

## 2022-02-25 ENCOUNTER — Other Ambulatory Visit: Payer: Self-pay

## 2022-02-25 DIAGNOSIS — E039 Hypothyroidism, unspecified: Secondary | ICD-10-CM

## 2022-02-25 MED ORDER — LEVOTHYROXINE SODIUM 50 MCG PO TABS: 50.0000 ug | ORAL_TABLET | Freq: Every day | ORAL | 0 refills | Status: DC

## 2022-02-25 NOTE — Telephone Encounter (Signed)
Done KH 

## 2022-03-01 ENCOUNTER — Other Ambulatory Visit: Payer: Self-pay | Admitting: Family Medicine

## 2022-03-01 DIAGNOSIS — E039 Hypothyroidism, unspecified: Secondary | ICD-10-CM

## 2022-03-01 NOTE — Telephone Encounter (Signed)
Pt has moved away ?

## 2022-03-07 ENCOUNTER — Ambulatory Visit: Payer: Medicare Other | Admitting: Internal Medicine

## 2022-03-16 ENCOUNTER — Telehealth: Payer: Self-pay | Admitting: Family Medicine

## 2022-03-16 ENCOUNTER — Other Ambulatory Visit: Payer: Self-pay

## 2022-03-16 ENCOUNTER — Other Ambulatory Visit: Payer: Self-pay | Admitting: Family Medicine

## 2022-03-16 ENCOUNTER — Other Ambulatory Visit: Payer: Self-pay | Admitting: Internal Medicine

## 2022-03-16 DIAGNOSIS — E039 Hypothyroidism, unspecified: Secondary | ICD-10-CM

## 2022-03-16 DIAGNOSIS — I4819 Other persistent atrial fibrillation: Secondary | ICD-10-CM

## 2022-03-16 DIAGNOSIS — E785 Hyperlipidemia, unspecified: Secondary | ICD-10-CM

## 2022-03-16 MED ORDER — ALENDRONATE SODIUM 70 MG PO TABS: ORAL_TABLET | ORAL | 0 refills | Status: AC

## 2022-03-16 MED ORDER — LEVOTHYROXINE SODIUM 50 MCG PO TABS: 50.0000 ug | ORAL_TABLET | Freq: Every day | ORAL | 0 refills | Status: AC

## 2022-03-16 NOTE — Telephone Encounter (Signed)
Sent for #90, patient has appt with new provider end of May. ?

## 2022-03-16 NOTE — Telephone Encounter (Signed)
Prescription refill request for Xarelto received.  ?Indication:Afib ?Last office visit:10/22 ?Weight:68.5 kg ?Age:81 ?SKS:[facility] ?CrCl:Needs Labs ? ?Prescription refilled ? ?

## 2022-03-16 NOTE — Telephone Encounter (Signed)
Needs refills on levothyroxine and alendronate ? ?Sent to [facility], [place]  ? ? ?

## 2022-08-08 ENCOUNTER — Other Ambulatory Visit: Payer: Self-pay | Admitting: Family Medicine

## 2022-08-08 ENCOUNTER — Other Ambulatory Visit: Payer: Self-pay | Admitting: Internal Medicine

## 2022-08-08 DIAGNOSIS — E785 Hyperlipidemia, unspecified: Secondary | ICD-10-CM

## 2022-08-08 NOTE — Telephone Encounter (Signed)
Sent my chart message. Pt needs appt

## 2022-09-15 ENCOUNTER — Telehealth: Payer: Self-pay | Admitting: *Deleted

## 2022-09-15 NOTE — Telephone Encounter (Signed)
Received a call from the patient requesting me to send copy of her sleep study to her new PCP, Dr Tomasita Morrow. She has moved to [place], [place] and he needs them in order to do prescription for her CPAP supplies.

## 2022-11-07 ENCOUNTER — Other Ambulatory Visit: Payer: Self-pay | Admitting: Internal Medicine

## 2022-11-07 NOTE — Telephone Encounter (Signed)
Pt's cardiologist is now Dr Stephanie Coup in [place], [place]. Called pharmacy and requested refill request be sent to new provider.

## 2023-01-20 ENCOUNTER — Other Ambulatory Visit: Payer: Self-pay | Admitting: Internal Medicine

## 2023-11-08 ENCOUNTER — Other Ambulatory Visit: Payer: Self-pay | Admitting: Internal Medicine

## 2023-11-10 ENCOUNTER — Other Ambulatory Visit: Payer: Self-pay | Admitting: Internal Medicine

## 2023-11-13 NOTE — Telephone Encounter (Signed)
Medication is no longer on medication list  patient has not seen Dr Rennis Golden since 2022. Per record patient has been seeing [facility] cardiology  2023 , 2024 .   Medication request denied.
